# Patient Record
Sex: Female | Born: 1965
Health system: Southern US, Community
[De-identification: ages and names within clinical notes are randomized; demographics above are authoritative.]

## PROBLEM LIST (undated history)

## (undated) DIAGNOSIS — R600 Localized edema: Secondary | ICD-10-CM

## (undated) DIAGNOSIS — D131 Benign neoplasm of stomach: Secondary | ICD-10-CM

## (undated) DIAGNOSIS — R002 Palpitations: Secondary | ICD-10-CM

## (undated) DIAGNOSIS — E785 Hyperlipidemia, unspecified: Secondary | ICD-10-CM

## (undated) DIAGNOSIS — R001 Bradycardia, unspecified: Secondary | ICD-10-CM

## (undated) DIAGNOSIS — G4730 Sleep apnea, unspecified: Secondary | ICD-10-CM

## (undated) DIAGNOSIS — K639 Disease of intestine, unspecified: Secondary | ICD-10-CM

## (undated) DIAGNOSIS — N201 Calculus of ureter: Secondary | ICD-10-CM

## (undated) DIAGNOSIS — Z9889 Other specified postprocedural states: Secondary | ICD-10-CM

## (undated) DIAGNOSIS — N200 Calculus of kidney: Secondary | ICD-10-CM

## (undated) DIAGNOSIS — I10 Essential (primary) hypertension: Secondary | ICD-10-CM

## (undated) DIAGNOSIS — G4733 Obstructive sleep apnea (adult) (pediatric): Secondary | ICD-10-CM

## (undated) DIAGNOSIS — G5600 Carpal tunnel syndrome, unspecified upper limb: Secondary | ICD-10-CM

## (undated) DIAGNOSIS — R011 Cardiac murmur, unspecified: Secondary | ICD-10-CM

## (undated) DIAGNOSIS — K219 Gastro-esophageal reflux disease without esophagitis: Secondary | ICD-10-CM

## (undated) DIAGNOSIS — Z87442 Personal history of urinary calculi: Secondary | ICD-10-CM

## (undated) DIAGNOSIS — R112 Nausea with vomiting, unspecified: Secondary | ICD-10-CM

## (undated) DIAGNOSIS — I341 Nonrheumatic mitral (valve) prolapse: Secondary | ICD-10-CM

## (undated) DIAGNOSIS — Z973 Presence of spectacles and contact lenses: Secondary | ICD-10-CM

## (undated) HISTORY — DX: Nonrheumatic mitral (valve) prolapse: I34.1

## (undated) HISTORY — DX: Disease of intestine, unspecified: K63.9

## (undated) HISTORY — DX: Localized edema: R60.0

## (undated) HISTORY — DX: Palpitations: R00.2

## (undated) HISTORY — DX: Sleep apnea, unspecified: G47.30

## (undated) HISTORY — DX: Cardiac murmur, unspecified: R01.1

## (undated) HISTORY — DX: Essential (primary) hypertension: I10

## (undated) HISTORY — PX: EXTRACORPOREAL SHOCK WAVE LITHOTRIPSY: SHX1557

## (undated) HISTORY — DX: Gastro-esophageal reflux disease without esophagitis: K21.9

## (undated) HISTORY — PX: ANKLE SURGERY: SHX546

## (undated) HISTORY — PX: CARDIOVASCULAR STRESS TEST: SHX262

## (undated) HISTORY — DX: Benign neoplasm of stomach: D13.1

---

## 1992-01-02 HISTORY — PX: TUBAL LIGATION: SHX77

## 1997-01-01 HISTORY — PX: VAGINAL HYSTERECTOMY: SUR661

## 1997-06-28 ENCOUNTER — Emergency Department (HOSPITAL_COMMUNITY): Admission: EM | Admit: 1997-06-28 | Discharge: 1997-06-28 | Payer: Self-pay | Admitting: Emergency Medicine

## 1997-10-27 ENCOUNTER — Inpatient Hospital Stay (HOSPITAL_COMMUNITY): Admission: AD | Admit: 1997-10-27 | Discharge: 1997-10-29 | Payer: Self-pay | Admitting: Obstetrics & Gynecology

## 2000-03-03 ENCOUNTER — Emergency Department (HOSPITAL_COMMUNITY): Admission: EM | Admit: 2000-03-03 | Discharge: 2000-03-03 | Payer: Self-pay | Admitting: Emergency Medicine

## 2000-03-03 ENCOUNTER — Encounter: Payer: Self-pay | Admitting: Emergency Medicine

## 2001-10-29 ENCOUNTER — Encounter: Payer: Self-pay | Admitting: Family Medicine

## 2001-11-14 ENCOUNTER — Ambulatory Visit (HOSPITAL_COMMUNITY): Admission: RE | Admit: 2001-11-14 | Discharge: 2001-11-14 | Payer: Self-pay | Admitting: Orthopedic Surgery

## 2001-11-14 ENCOUNTER — Encounter (INDEPENDENT_AMBULATORY_CARE_PROVIDER_SITE_OTHER): Payer: Self-pay | Admitting: Specialist

## 2001-11-14 HISTORY — PX: WRIST GANGLION EXCISION: SUR520

## 2002-03-30 ENCOUNTER — Ambulatory Visit (HOSPITAL_COMMUNITY): Admission: RE | Admit: 2002-03-30 | Discharge: 2002-03-30 | Payer: Self-pay | Admitting: Orthopedic Surgery

## 2002-03-30 HISTORY — PX: OTHER SURGICAL HISTORY: SHX169

## 2002-10-11 ENCOUNTER — Ambulatory Visit (HOSPITAL_BASED_OUTPATIENT_CLINIC_OR_DEPARTMENT_OTHER): Admission: RE | Admit: 2002-10-11 | Discharge: 2002-10-11 | Payer: Self-pay | Admitting: Otolaryngology

## 2004-05-10 ENCOUNTER — Encounter: Admission: RE | Admit: 2004-05-10 | Discharge: 2004-05-10 | Payer: Self-pay | Admitting: Cardiovascular Disease

## 2004-05-11 ENCOUNTER — Encounter: Payer: Self-pay | Admitting: Family Medicine

## 2006-12-03 ENCOUNTER — Ambulatory Visit (HOSPITAL_COMMUNITY): Admission: RE | Admit: 2006-12-03 | Discharge: 2006-12-03 | Payer: Self-pay | Admitting: Cardiovascular Disease

## 2007-11-16 ENCOUNTER — Emergency Department (HOSPITAL_COMMUNITY): Admission: EM | Admit: 2007-11-16 | Discharge: 2007-11-16 | Payer: Self-pay | Admitting: Emergency Medicine

## 2009-01-19 ENCOUNTER — Ambulatory Visit: Payer: Self-pay | Admitting: Family Medicine

## 2009-01-19 DIAGNOSIS — I059 Rheumatic mitral valve disease, unspecified: Secondary | ICD-10-CM | POA: Insufficient documentation

## 2009-11-08 ENCOUNTER — Encounter: Payer: Self-pay | Admitting: Family Medicine

## 2010-01-31 NOTE — Assessment & Plan Note (Signed)
Summary: NEW PT EST (TXFR FROM [facility]) // RS   Vital Signs:  Patient profile:   45 year old female Menstrual status:  hysterectomy Height:      61 inches Weight:      165 pounds BMI:     31.29 Temp:     98.0 degrees F oral Pulse rate:   120 / minute Pulse rhythm:   regular Resp:     12 per minute BP sitting:   110 / 74  (left arm) Cuff size:   regular  Vitals Entered By: Sid Falcon LPN (January 19, 2009 2:18 PM) CC: New to establish     Menstrual Status hysterectomy Last PAP Result normal   History of Present Illness: New patient to establish care.  Patient has acute issue of onset of illness 2-3 days ago. Started with headache, sore throat and nasal congestion. Now has some dry cough and wheezing especially at night. Possible low-grade fevers off-and-on. Nonsmoker. No history of asthma. Out of work past couple days.  Past medical history reviewed. She has history of reported mitral valve prolapse and takes Toprol and Cardizem. Previous hysterectomy 1999. Allergy to penicillin and vancomycin.  She is not aware of any hx of hypertension.  Family history reviewed significant for mother with heart disease hypertension mother and father mother with type 2 diabetes history.  Patient is married. Nonsmoker. No alcohol use.  Preventive Screening-Counseling & Management  Alcohol-Tobacco     Smoking Status: never  Allergies (verified): 1)  Penicillin V Potassium (Penicillin V Potassium) 2)  Vancocin Hcl (Vancomycin Hcl)  Past History:  Family History: Last updated: 01/19/2009 Heart disease, mother, grandmother Family History Hypertension, both parents Diabetes, mother, type ll  Social History: Last updated: 01/19/2009 Occupation:  Engineer, mining Married Never Smoked Alcohol use-yes  Risk Factors: Smoking Status: never (01/19/2009)  Past Medical History: Chicken pox Heart murmur/MVP  Past Surgical History: Hysterectomy/partial 1999  Family  History: Heart disease, mother, grandmother Family History Hypertension, both parents Diabetes, mother, type ll  Social History: Occupation:  Engineer, 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   Ears:  patient some scarring left tympanic membrane otherwise normal Nose:  External nasal examination shows no deformity or inflammation. Nasal mucosa are pink and moist without lesions or exudates. Mouth:  no erythema or exudate Neck:  supple with slightly tender anterior cervical nodes bilaterally Lungs:  diffuse wheezes. No rales. No retractions. Heart:  normal rate and regular rhythm.   Skin:  no rash Cervical Nodes:  No lymphadenopathy noted   Impression & Recommendations:  Problem # 1:  BRONCHITIS, ACUTE WITH MILD BRONCHOSPASM (ICD-466.0) Assessment New Prednisone taper, ProAir inh as needed , and start Z-max. Her updated medication list for this problem includes:    Proair Hfa 108 (90 Base) Mcg/act Aers (Albuterol sulfate) .Marland Kitchen... 2 puffs q 4 hours as needed cough and wheeze    Azithromycin 250 Mg Tabs (Azithromycin) .Marland Kitchen... 2 by mouth today then one by mouth once daily for 4 days  Problem # 2:  MITRAL VALVE PROLAPSE (ICD-424.0)  Her updated medication list for this problem includes:    Toprol Xl 50 Mg Xr24h-tab (Metoprolol succinate) .Marland Kitchen..Marland Kitchen Two times a day  Complete Medication List: 1)  Toprol Xl 50 Mg Xr24h-tab (Metoprolol succinate) .... Two times a day 2)  Cardizem La 240 Mg Xr24h-tab (Diltiazem hcl coated beads) .... Once daily 3)  Prednisone 10 Mg Tabs (Prednisone) .... Taper as follows:  4-4-4-3-3-2-1 4)  Proair Hfa 108 (90 Base) Mcg/act Aers (Albuterol sulfate) .... 2  puffs q 4 hours as needed cough and wheeze 5)  Azithromycin 250 Mg Tabs (Azithromycin) .... 2 by mouth today then one by mouth once daily for 4 days  Patient Instructions: 1)  Acute Bronchitis symptoms for less then 10 days are not  helped by antibiotics. Take over the counter cough medications. Call if no improvement in 5-7 days, sooner if increasing cough, fever, or new symptoms ( shortness of breath, chest pain) .  Prescriptions: AZITHROMYCIN 250 MG TABS (AZITHROMYCIN) 2 by mouth today then one by mouth once daily for 4 days  #6 x 0   Entered and Authorized by:   Evelena Peat MD   Signed by:   Evelena Peat MD on 01/19/2009   Method used:   Print then Give to Patient   RxID:   [id number] PROAIR HFA 108 (90 BASE) MCG/ACT AERS (ALBUTEROL SULFATE) 2 puffs q 4 hours as needed cough and wheeze  #1 x 1   Entered and Authorized by:   Evelena Peat MD   Signed by:   Evelena Peat MD on 01/19/2009   Method used:   Print then Give to Patient   RxID:   [id number] PREDNISONE 10 MG TABS (PREDNISONE) taper as follows:  4-4-4-3-3-2-1  #21 x 0   Entered and Authorized by:   Evelena Peat MD   Signed by:   Evelena Peat MD on 01/19/2009   Method used:   Print then Give to Patient   RxID:   [id number]   Preventive Care Screening  Pap Smear:    Date:  01/01/2005    Results:  normal   Mammogram:    Date:  01/01/1998    Results:  normal

## 2010-05-19 NOTE — Op Note (Signed)
Sarah Wong, Sarah Wong                          ACCOUNT NO.:  [id number]   MEDICAL RECORD NO.:  [id number]                    PATIENT TYPE:   LOCATION:                                       FACILITY:   PHYSICIAN:  Marlowe Kays, M.D.               DATE OF BIRTH:   DATE OF PROCEDURE:  03/30/2002  DATE OF DISCHARGE:                                 OPERATIVE REPORT   PREOPERATIVE DIAGNOSES:  Chronic pain and swelling dorsum right wrist post  excision of ganglion.   POSTOPERATIVE DIAGNOSES:  Fibrosis with secondary loss of motion and chronic  tendonitis of extensor carpi radialis brevis, right wrist.   OPERATION:  Exploration of right wrist with capsulotomy and inspection of  joint and tenolysis of extensor carpi radialis brevis.   SURGEON:  Marlowe Kays, M.D.   ASSISTANT:  Nurse.   ANESTHESIA:  General.   PATHOLOGY AND JUSTIFICATION FOR PROCEDURE:  The original surgery was on  November 14, 2001. Despite physical therapy, she has had marked restriction  of motion and has had some chronic problems with swelling, pain and  tenderness over the radial and proximal to the most radial portion of the  incision. For these reasons, I felt the exploration of the wrist was the  treatment of choice to better determine exactly what was going on and  correct it.   DESCRIPTION OF PROCEDURE:  Satisfactory general anesthesia, once  anesthetized I checked motion of her right wrist which went from almost no  motion to 75 degrees of dorsiflexion and 45 degrees of palmar flexion. I  then prepped the hand, wrist and forearm and draped in a sterile field after  inflating the pneumatic tourniquet.  I went through the old surgical  incision more bias to the radial side since this is where her pain and  swelling was. With careful dissection, I opened the tendon sheath for the  common extensor tendon which was normal but the tendon sheath for the  extensor brevis had a moderate amount of discus  ganglion type fluid in it.  It should be consistent with a chronic tendonitis. There were no real  adhesions about the tendon, however. Retracting the brevis tendon radialward  and the common extensor tendon lateralward, I opened this joint and found no  evidence for any recurrent ganglion. There was a small aperture going deep  radialward which may or may not have been iatrogenic as I was clearing out  tissue in the joint. After inspection of the joint, I irrigated the joint  well with sterile saline and closed the capsule with interrupted 3-0 Vicryl  with the wrist in slight dorsiflexion. I then infiltrated all soft tissues  with 0.5% plain Marcaine. The extensor brevis sheath was further inspected  and opened so that there was no compression on the tendon and plenty of room  for drainage. I then loosely closed serous type tissue over the  brevis and  common extensor tendons. The subcutaneous tissue was closed with interrupted  4-0 Vicryl and skin with Steri-Strips. A dry sterile dressing and volar  plaster splint were applied. The tourniquet was released, she tolerated the  procedure well and was taken to the recovery room in satisfactory condition  with no known complications.                                              Marlowe Kays, M.D.   JA/MEDQ  D:  03/30/2002  T:  03/30/2002  Job:  [id number]

## 2010-05-19 NOTE — Op Note (Signed)
   Sarah Wong, Sarah Wong                          ACCOUNT NO.:  [id number]   MEDICAL RECORD NO.:  [id number]                   PATIENT TYPE:  AMB   LOCATION:  DAY                                  FACILITY:  [facility]   PHYSICIAN:  Marlowe Kays, M.D.               DATE OF BIRTH:  Aug 22, 1965   DATE OF PROCEDURE:  11/14/2001  DATE OF DISCHARGE:                                 OPERATIVE REPORT   PREOPERATIVE DIAGNOSIS:  Painful ganglion cyst, dorsum right wrist.   POSTOPERATIVE DIAGNOSIS:  Painful ganglion cyst, dorsum right wrist.   OPERATION:  Excision of ganglion cyst, dorsum right wrist.   SURGEON:  Illene Labrador. [place], M.D.   ASSISTANT:  Nurse.   ANESTHESIA:  IV regional.   PATHOLOGY AND JUSTIFICATION FOR PROCEDURE:  This has been present for  several months with enough pain that she elected to have it excised.  We  defined a ganglion which was somewhat scarred in.   PROCEDURE:  Satisfactory IV regional anesthesia, although she did have a  good bit of emotional lability during the case, and is probably not a  suitable candidate for IV regional in the future.  The right hand and  forearm were prepped with DuraPrep and draped in a sterile field.  Transverse incision was made over the site of the ganglion.  One large  dorsal vein was retracted and protected.  Extensor tendons were retracted to  either side after incising the dorsal retinaculum, and several areas  comprising almost of multiloculated cysts were identified and excised.  There was some minimal bleeding coagulated with bipolar cautery.  After  excision of the cyst, which was sent to pathology, the wound was irrigated  with sterile saline and the capsule of the wrist joint reapproximated with  interrupted 3-0 Vicryl as was the extensor retinaculum, subcutaneous tissue  with interrupted 4-0 Vicryl and the skin with running 4-0 nylon Taylor  stitch.  Betadine, Adaptic, dry sterile dressing, and volar plaster splint  were  applied.  Tourniquet was released.  She tolerated the procedure well  and was taken to the recovery room in satisfactory condition with no known  complications.                                               Marlowe Kays, M.D.    JA/MEDQ  D:  11/14/2001  T:  11/14/2001  Job:  [id number]

## 2010-09-25 ENCOUNTER — Ambulatory Visit (INDEPENDENT_AMBULATORY_CARE_PROVIDER_SITE_OTHER): Payer: Self-pay | Admitting: Family Medicine

## 2010-09-25 ENCOUNTER — Encounter: Payer: Self-pay | Admitting: Family Medicine

## 2010-09-25 VITALS — BP 114/80 | HR 63 | Temp 98.3°F | Wt 168.0 lb

## 2010-09-25 DIAGNOSIS — R350 Frequency of micturition: Secondary | ICD-10-CM

## 2010-09-25 DIAGNOSIS — N390 Urinary tract infection, site not specified: Secondary | ICD-10-CM

## 2010-09-25 LAB — POCT URINALYSIS DIPSTICK
Spec Grav, UA: 1.02
Urobilinogen, UA: 0.2
pH, UA: 6

## 2010-09-25 MED ORDER — CIPROFLOXACIN HCL 500 MG PO TABS: 500.0000 mg | ORAL_TABLET | Freq: Two times a day (BID) | ORAL | Status: AC

## 2010-09-25 NOTE — Progress Notes (Signed)
  Subjective:    Patient ID: Sarah Wong, female    DOB: 07/06/1965, 45 y.o.   MRN: [id number]  HPI Here for 3 days of a dull aching pain in the right flank and of urgency and frequency to urinate. No burning. No fever. Her appetite is normal. She has had nausea but no vomiting. BMs are normal.    Review of Systems  Constitutional: Negative.   Respiratory: Negative.   Cardiovascular: Negative.   Gastrointestinal: Positive for nausea and abdominal pain. Negative for vomiting, diarrhea, constipation, blood in stool and abdominal distention.  Genitourinary: Positive for urgency, frequency and flank pain. Negative for dysuria and hematuria.       Objective:   Physical Exam  Constitutional: She appears well-developed and well-nourished.  Pulmonary/Chest: Effort normal and breath sounds normal.  Abdominal: Soft. Bowel sounds are normal. She exhibits no distension and no mass. There is no rebound and no guarding.       Mildly tender in the RLQ          Assessment & Plan:  This is likely a UTI so we will start her on Cipro. Drink plenty of water. Culture the urine. Less likely etiologies include a ureteral stone or appendicitis. She understands that she needs to go to an ER immediately if she gets worsening of the pain or fever or vomiting

## 2010-09-25 NOTE — Progress Notes (Signed)
Addended by: Gershon Crane A on: 09/25/2010 05:47 PM   Modules accepted: Orders

## 2010-09-29 LAB — URINE CULTURE: Colony Count: 35000

## 2010-10-03 ENCOUNTER — Telehealth: Payer: Self-pay | Admitting: Family Medicine

## 2010-10-03 NOTE — Telephone Encounter (Signed)
Message copied by Baldemar Friday on Tue Oct 03, 2010 12:50 PM ------      Message from: Gershon Crane A      Created: Fri Sep 29, 2010  1:56 PM       Her urine grew a common bacteria that should respond to the Cipro she is taking

## 2010-10-03 NOTE — Telephone Encounter (Signed)
Left voice message with results.

## 2010-10-04 LAB — URINE MICROSCOPIC-ADD ON

## 2010-10-04 LAB — CBC
HCT: 41.3
MCV: 95.3
Platelets: 235
RDW: 13.1

## 2010-10-04 LAB — URINALYSIS, ROUTINE W REFLEX MICROSCOPIC
Glucose, UA: NEGATIVE
Specific Gravity, Urine: 1.02
Urobilinogen, UA: 0.2
pH: 6

## 2010-10-04 LAB — DIFFERENTIAL
Lymphs Abs: 1.4
Monocytes Absolute: 0.4
Monocytes Relative: 3
Neutrophils Relative %: 88 — ABNORMAL HIGH

## 2010-10-04 LAB — BASIC METABOLIC PANEL
BUN: 8
GFR calc non Af Amer: >60
Glucose, Bld: 105 — ABNORMAL HIGH
Potassium: 4.6

## 2011-05-19 ENCOUNTER — Emergency Department (HOSPITAL_COMMUNITY): Payer: BC Managed Care – PPO

## 2011-05-19 ENCOUNTER — Emergency Department (HOSPITAL_COMMUNITY)
Admission: EM | Admit: 2011-05-19 | Discharge: 2011-05-19 | Disposition: A | Discharge status: Home / Self Care | Payer: BC Managed Care – PPO | Attending: Emergency Medicine | Admitting: Emergency Medicine

## 2011-05-19 ENCOUNTER — Encounter (HOSPITAL_COMMUNITY): Payer: Self-pay | Admitting: Emergency Medicine

## 2011-05-19 DIAGNOSIS — N23 Unspecified renal colic: Secondary | ICD-10-CM

## 2011-05-19 DIAGNOSIS — R109 Unspecified abdominal pain: Secondary | ICD-10-CM | POA: Insufficient documentation

## 2011-05-19 DIAGNOSIS — R10819 Abdominal tenderness, unspecified site: Secondary | ICD-10-CM | POA: Insufficient documentation

## 2011-05-19 DIAGNOSIS — N201 Calculus of ureter: Secondary | ICD-10-CM | POA: Insufficient documentation

## 2011-05-19 DIAGNOSIS — R112 Nausea with vomiting, unspecified: Secondary | ICD-10-CM | POA: Insufficient documentation

## 2011-05-19 LAB — URINALYSIS, ROUTINE W REFLEX MICROSCOPIC
Bilirubin Urine: NEGATIVE
Ketones, ur: NEGATIVE mg/dL
Nitrite: NEGATIVE
pH: 5.5 (ref 5.0–8.0)

## 2011-05-19 LAB — CBC
HCT: 41 % (ref 36.0–46.0)
Hemoglobin: 14.1 g/dL (ref 12.0–15.0)
MCH: 32.6 pg (ref 26.0–34.0)
MCHC: 34.4 g/dL (ref 30.0–36.0)

## 2011-05-19 LAB — BASIC METABOLIC PANEL
BUN: 7 mg/dL (ref 6–23)
Chloride: 102 mEq/L (ref 96–112)
Creatinine, Ser: 0.78 mg/dL (ref 0.50–1.10)
GFR calc Af Amer: >90 mL/min (ref >90)
GFR calc non Af Amer: >90 mL/min (ref >90)
Glucose, Bld: 139 mg/dL — ABNORMAL HIGH (ref 70–99)

## 2011-05-19 LAB — DIFFERENTIAL
Basophils Relative: 0 % (ref 0–1)
Eosinophils Absolute: 0 10*3/uL (ref 0.0–0.7)
Monocytes Absolute: 0.3 10*3/uL (ref 0.1–1.0)
Monocytes Relative: 3 % (ref 3–12)

## 2011-05-19 MED ORDER — ONDANSETRON 4 MG PO TBDP
4.0000 mg | ORAL_TABLET | Freq: Once | ORAL | Status: AC
  Administered 2011-05-19: 4 mg via ORAL
  Filled 2011-05-19: qty 1

## 2011-05-19 MED ORDER — TAMSULOSIN HCL 0.4 MG PO CAPS: 0.4000 mg | ORAL_CAPSULE | Freq: Every day | ORAL | Status: DC

## 2011-05-19 MED ORDER — OXYCODONE-ACETAMINOPHEN 5-325 MG PO TABS: 1.0000 | ORAL_TABLET | ORAL | Status: DC | PRN

## 2011-05-19 MED ORDER — ONDANSETRON HCL 4 MG/2ML IJ SOLN
4.0000 mg | Freq: Once | INTRAMUSCULAR | Status: AC
  Administered 2011-05-19: 4 mg via INTRAVENOUS
  Filled 2011-05-19: qty 2

## 2011-05-19 MED ORDER — HYDROMORPHONE HCL PF 1 MG/ML IJ SOLN
1.0000 mg | Freq: Once | INTRAMUSCULAR | Status: AC
  Administered 2011-05-19: 1 mg via INTRAVENOUS
  Filled 2011-05-19: qty 1

## 2011-05-19 MED ORDER — OXYCODONE-ACETAMINOPHEN 5-325 MG PO TABS
1.0000 | ORAL_TABLET | Freq: Once | ORAL | Status: AC
  Administered 2011-05-19: 1 via ORAL
  Filled 2011-05-19: qty 1

## 2011-05-19 MED ORDER — ONDANSETRON HCL 4 MG PO TABS: 4.0000 mg | ORAL_TABLET | Freq: Four times a day (QID) | ORAL | Status: AC

## 2011-05-19 NOTE — ED Provider Notes (Signed)
Medical screening examination/treatment/procedure(s) were performed by non-physician practitioner and as supervising physician I was immediately available for consultation/collaboration.   Niki Cosman, MD 05/19/11 1657 

## 2011-05-19 NOTE — ED Notes (Signed)
Pt reports woke up at 0930 today with left sided abdominal pain with n/v.

## 2011-05-19 NOTE — Discharge Instructions (Signed)

## 2011-05-19 NOTE — ED Provider Notes (Signed)
History     CSN: [id number]  Arrival date & time 05/19/11  1205   First MD Initiated Contact with Patient 05/19/11 1226      Chief Complaint  Patient presents with  . Nausea  . Emesis  . Abdominal Pain    (Consider location/radiation/quality/duration/timing/severity/associated sxs/prior treatment) HPI Comments: Patient reports left sided abdominal pain with associated nausea and vomiting that began this morning.  Reports pain is sharp and constant, throughout left abdomen.  Emesis is contents of her stomach.  Pain is gradually increasing.  Denies fevers, urinary or vaginal symptoms, change in bowel habits.  No hematemesis.  Has hx partial hysterectomy.  Has hx kidney stones, but states this is worse than her kidney stones.    Patient is a 46 y.o. female presenting with vomiting and abdominal pain. The history is provided by the patient.  Emesis  Associated symptoms include abdominal pain. Pertinent negatives include no diarrhea and no fever.  Abdominal Pain The primary symptoms of the illness include abdominal pain, nausea and vomiting. The primary symptoms of the illness do not include fever, diarrhea, dysuria, vaginal discharge or vaginal bleeding.  Symptoms associated with the illness do not include constipation, urgency, frequency or back pain.    Past Medical History  Diagnosis Date  . Heart murmur     Past Surgical History  Procedure Date  . Abdominal hysterectomy   . Nasal septum surgery     Family History  Problem Relation Age of Onset  . Heart disease      grandmother  . Hypertension Mother   . Hypertension Father   . Diabetes Mother     History  Substance Use Topics  . Smoking status: Never Smoker   . Smokeless tobacco: Never Used  . Alcohol Use: No    OB History    Grav Para Term Preterm Abortions TAB SAB Ect Mult Living                  Review of Systems  Constitutional: Negative for fever.  Gastrointestinal: Positive for nausea, vomiting and  abdominal pain. Negative for diarrhea, constipation and blood in stool.  Genitourinary: Negative for dysuria, urgency, frequency, vaginal bleeding, vaginal discharge and menstrual problem.  Musculoskeletal: Negative for back pain.  All other systems reviewed and are negative.    Allergies  Iohexol; Penicillins; and Vancomycin  Home Medications   Current Outpatient Rx  Name Route Sig Dispense Refill  . DILTIAZEM HCL ER 180 MG PO CP24 Oral Take 180 mg by mouth daily.    Marland Kitchen METOPROLOL SUCCINATE ER 25 MG PO TB24 Oral Take 25 mg by mouth 2 (two) times daily.    Marland Kitchen PANTOPRAZOLE SODIUM 40 MG PO TBEC Oral Take 40 mg by mouth daily.      Marland Kitchen ROSUVASTATIN CALCIUM 5 MG PO TABS Oral Take 5 mg by mouth daily.    . ALBUTEROL SULFATE HFA 108 (90 BASE) MCG/ACT IN AERS Inhalation Inhale 2 puffs into the lungs every 4 (four) hours as needed. For shortness of breath      BP 147/93  Pulse 74  Temp(Src) 97.8 F (36.6 C) (Oral)  Resp 20  SpO2 99%  Physical Exam  Constitutional: She is oriented to person, place, and time. She appears well-developed and well-nourished.  HENT:  Head: Normocephalic and atraumatic.  Neck: Neck supple.  Cardiovascular: Normal rate, regular rhythm and normal heart sounds.   Pulmonary/Chest: Breath sounds normal. No respiratory distress. She has no wheezes. She has no rales.  She exhibits no tenderness.  Abdominal: Soft. Bowel sounds are normal. There is tenderness in the left upper quadrant and left lower quadrant. There is no rigidity, no rebound, no guarding and no CVA tenderness.  Neurological: She is alert and oriented to person, place, and time.    ED Course  Procedures (including critical care time)  Labs Reviewed  CBC - Abnormal; Notable for the following:    WBC 12.1 (*)    All other components within normal limits  DIFFERENTIAL - Abnormal; Notable for the following:    Neutrophils Relative 84 (*)    Neutro Abs 10.1 (*)    All other components within normal  limits  BASIC METABOLIC PANEL - Abnormal; Notable for the following:    Glucose, Bld 139 (*)    All other components within normal limits  URINALYSIS, ROUTINE W REFLEX MICROSCOPIC - Abnormal; Notable for the following:    APPearance CLOUDY (*)    Hgb urine dipstick LARGE (*)    All other components within normal limits  URINE MICROSCOPIC-ADD ON - Abnormal; Notable for the following:    Squamous Epithelial / LPF MANY (*)    All other components within normal limits   No results found.  3:27 PM Pain and nausea continue to be well controlled.  Pending CT scan.    No diagnosis found.    MDM  Afebrile patient with left sided abdominal pain with associated N/V, one loose stool this morning.  WBC count 12.1, +hematuria without evidence of infection, glucose 139.  Patient's pain well controlled in ED.  Pending CT scan to r/o ureteral stone, diverticulitis, colitis.  Discussed patient with Dr Ranae Palms who assumes care of patient at change of shift.          [place], [place] 05/19/11 [phone]

## 2011-05-21 ENCOUNTER — Ambulatory Visit (HOSPITAL_COMMUNITY): Payer: BC Managed Care – PPO | Admitting: Anesthesiology

## 2011-05-21 ENCOUNTER — Ambulatory Visit (HOSPITAL_COMMUNITY)
Admission: RE | Admit: 2011-05-21 | Discharge: 2011-05-21 | Disposition: A | Discharge status: Home / Self Care | Payer: BC Managed Care – PPO | Source: Ambulatory Visit | Attending: Urology | Admitting: Urology

## 2011-05-21 ENCOUNTER — Encounter (HOSPITAL_COMMUNITY): Payer: Self-pay | Admitting: Anesthesiology

## 2011-05-21 ENCOUNTER — Other Ambulatory Visit: Payer: Self-pay | Admitting: Urology

## 2011-05-21 ENCOUNTER — Encounter (HOSPITAL_COMMUNITY)
Admission: RE | Disposition: A | Discharge status: Home / Self Care | Payer: Self-pay | Source: Ambulatory Visit | Attending: Urology

## 2011-05-21 ENCOUNTER — Encounter (HOSPITAL_COMMUNITY): Payer: Self-pay | Admitting: *Deleted

## 2011-05-21 DIAGNOSIS — I059 Rheumatic mitral valve disease, unspecified: Secondary | ICD-10-CM | POA: Insufficient documentation

## 2011-05-21 DIAGNOSIS — G4730 Sleep apnea, unspecified: Secondary | ICD-10-CM | POA: Insufficient documentation

## 2011-05-21 DIAGNOSIS — K219 Gastro-esophageal reflux disease without esophagitis: Secondary | ICD-10-CM | POA: Insufficient documentation

## 2011-05-21 DIAGNOSIS — N201 Calculus of ureter: Secondary | ICD-10-CM | POA: Insufficient documentation

## 2011-05-21 DIAGNOSIS — Z79899 Other long term (current) drug therapy: Secondary | ICD-10-CM | POA: Insufficient documentation

## 2011-05-21 HISTORY — PX: CYSTOSCOPY W/ URETERAL STENT PLACEMENT: SHX1429

## 2011-05-21 LAB — BASIC METABOLIC PANEL
BUN: 10 mg/dL (ref 6–23)
CO2: 27 mEq/L (ref 19–32)
Chloride: 99 mEq/L (ref 96–112)
Creatinine, Ser: 1.25 mg/dL — ABNORMAL HIGH (ref 0.50–1.10)
GFR calc Af Amer: 58 mL/min — ABNORMAL LOW (ref >90)
Glucose, Bld: 106 mg/dL — ABNORMAL HIGH (ref 70–99)
Potassium: 3.7 mEq/L (ref 3.5–5.1)

## 2011-05-21 LAB — SURGICAL PCR SCREEN
MRSA, PCR: NEGATIVE
Staphylococcus aureus: POSITIVE — AB

## 2011-05-21 LAB — CBC
HCT: 40.4 % (ref 36.0–46.0)
Hemoglobin: 13.8 g/dL (ref 12.0–15.0)
MCV: 93.5 fL (ref 78.0–100.0)
RDW: 12.4 % (ref 11.5–15.5)
WBC: 13.4 10*3/uL — ABNORMAL HIGH (ref 4.0–10.5)

## 2011-05-21 SURGERY — CYSTOSCOPY, WITH RETROGRADE PYELOGRAM AND URETERAL STENT INSERTION
Anesthesia: General | Site: Ureter | Laterality: Left | Wound class: Clean Contaminated

## 2011-05-21 MED ORDER — SODIUM CHLORIDE 0.9 % IR SOLN
Status: DC | PRN
  Administered 2011-05-21: 3000 mL

## 2011-05-21 MED ORDER — ONDANSETRON HCL 4 MG/2ML IJ SOLN
INTRAMUSCULAR | Status: DC | PRN
  Administered 2011-05-21: 4 mg via INTRAVENOUS

## 2011-05-21 MED ORDER — MORPHINE SULFATE 2 MG/ML IJ SOLN
INTRAMUSCULAR | Status: AC
  Administered 2011-05-21: 2 mg via INTRAVENOUS
  Filled 2011-05-21: qty 1

## 2011-05-21 MED ORDER — SODIUM CHLORIDE 0.9 % IV SOLN: 250.0000 mL | INTRAVENOUS | Status: DC | PRN

## 2011-05-21 MED ORDER — SODIUM CHLORIDE 0.9 % IJ SOLN: 3.0000 mL | INTRAMUSCULAR | Status: DC | PRN

## 2011-05-21 MED ORDER — LIDOCAINE HCL (CARDIAC) 20 MG/ML IV SOLN
INTRAVENOUS | Status: DC | PRN
  Administered 2011-05-21: 80 mg via INTRAVENOUS

## 2011-05-21 MED ORDER — FENTANYL CITRATE 0.05 MG/ML IJ SOLN: 25.0000 ug | INTRAMUSCULAR | Status: DC | PRN

## 2011-05-21 MED ORDER — ACETAMINOPHEN 650 MG RE SUPP
650.0000 mg | RECTAL | Status: DC | PRN
  Filled 2011-05-21: qty 1

## 2011-05-21 MED ORDER — IOHEXOL 300 MG/ML  SOLN
INTRAMUSCULAR | Status: DC | PRN
  Administered 2011-05-21: 3 mL via INTRAVENOUS

## 2011-05-21 MED ORDER — MORPHINE SULFATE 2 MG/ML IJ SOLN
2.0000 mg | INTRAMUSCULAR | Status: DC | PRN
  Administered 2011-05-21: 2 mg via INTRAVENOUS

## 2011-05-21 MED ORDER — OXYCODONE HCL 5 MG PO TABS: 5.0000 mg | ORAL_TABLET | ORAL | Status: DC | PRN

## 2011-05-21 MED ORDER — MUPIROCIN 2 % EX OINT
TOPICAL_OINTMENT | CUTANEOUS | Status: AC
  Filled 2011-05-21: qty 22

## 2011-05-21 MED ORDER — LACTATED RINGERS IV SOLN
INTRAVENOUS | Status: DC | PRN
  Administered 2011-05-21: 19:00:00 via INTRAVENOUS

## 2011-05-21 MED ORDER — LACTATED RINGERS IV SOLN: INTRAVENOUS | Status: DC

## 2011-05-21 MED ORDER — PROPOFOL 10 MG/ML IV EMUL
INTRAVENOUS | Status: DC | PRN
  Administered 2011-05-21: 200 mg via INTRAVENOUS

## 2011-05-21 MED ORDER — HYOSCYAMINE SULFATE 0.125 MG SL SUBL: 0.1250 mg | SUBLINGUAL_TABLET | SUBLINGUAL | Status: DC | PRN

## 2011-05-21 MED ORDER — PROMETHAZINE HCL 25 MG/ML IJ SOLN: 6.2500 mg | INTRAMUSCULAR | Status: DC | PRN

## 2011-05-21 MED ORDER — FENTANYL CITRATE 0.05 MG/ML IJ SOLN
INTRAMUSCULAR | Status: DC | PRN
  Administered 2011-05-21: 50 ug via INTRAVENOUS
  Administered 2011-05-21: 25 ug via INTRAVENOUS

## 2011-05-21 MED ORDER — ONDANSETRON HCL 4 MG/2ML IJ SOLN: 4.0000 mg | Freq: Four times a day (QID) | INTRAMUSCULAR | Status: DC | PRN

## 2011-05-21 MED ORDER — PHENAZOPYRIDINE HCL 200 MG PO TABS: 200.0000 mg | ORAL_TABLET | Freq: Three times a day (TID) | ORAL | Status: AC | PRN

## 2011-05-21 MED ORDER — METOPROLOL SUCCINATE ER 25 MG PO TB24
25.0000 mg | ORAL_TABLET | Freq: Every day | ORAL | Status: DC
  Filled 2011-05-21: qty 1

## 2011-05-21 MED ORDER — IOHEXOL 300 MG/ML  SOLN
INTRAMUSCULAR | Status: AC
  Filled 2011-05-21: qty 1

## 2011-05-21 MED ORDER — SODIUM CHLORIDE 0.9 % IJ SOLN: 3.0000 mL | Freq: Two times a day (BID) | INTRAMUSCULAR | Status: DC

## 2011-05-21 MED ORDER — LACTATED RINGERS IV SOLN
INTRAVENOUS | Status: DC
  Administered 2011-05-21: 21:00:00 via INTRAVENOUS

## 2011-05-21 MED ORDER — ACETAMINOPHEN 325 MG PO TABS: 650.0000 mg | ORAL_TABLET | ORAL | Status: DC | PRN

## 2011-05-21 MED ORDER — OXYCODONE-ACETAMINOPHEN 5-325 MG PO TABS: 1.0000 | ORAL_TABLET | ORAL | Status: AC | PRN

## 2011-05-21 MED ORDER — CIPROFLOXACIN IN D5W 400 MG/200ML IV SOLN
400.0000 mg | INTRAVENOUS | Status: AC
  Administered 2011-05-21: 400 mg via INTRAVENOUS

## 2011-05-21 SURGICAL SUPPLY — 15 items
BAG URO CATCHER STRL LF (DRAPE) ×2 IMPLANT
BASKET LASER NITINOL 1.9FR (BASKET) ×2 IMPLANT
BSKT STON RTRVL 120 1.9FR (BASKET) ×2
CATH URET 5FR 28IN OPEN ENDED (CATHETERS) ×2 IMPLANT
CLOTH BEACON ORANGE TIMEOUT ST (SAFETY) ×2 IMPLANT
DRAPE CAMERA CLOSED 9X96 (DRAPES) ×2 IMPLANT
GLOVE SURG SS PI 8.0 STRL IVOR (GLOVE) ×2 IMPLANT
GOWN PREVENTION PLUS XLARGE (GOWN DISPOSABLE) ×2 IMPLANT
GOWN STRL REIN XL XLG (GOWN DISPOSABLE) ×2 IMPLANT
MANIFOLD NEPTUNE II (INSTRUMENTS) ×2 IMPLANT
MARKER SKIN DUAL TIP RULER LAB (MISCELLANEOUS) ×2 IMPLANT
PACK CYSTO (CUSTOM PROCEDURE TRAY) ×2 IMPLANT
STENT CONTOUR 6FRX26X.038 (STENTS) ×2 IMPLANT
TUBING CONNECTING 10 (TUBING) ×2 IMPLANT
WIRE COONS/BENSON .038X145CM (WIRE) ×2 IMPLANT

## 2011-05-21 NOTE — H&P (Signed)
ctive Problems Problems  1. Nephrolithiasis Of Both Kidneys 592.0 2. Ureteral Stone Left 592.1  History of Present Illness        Sarah Wong is a 46 yo WF who had the onset of severe left flank pain on Saturday AM.  She was seen in the ER and had a CT that showed a 5mm LUPJ stone.   She got pain relief with the med but the pain recurred and remains severe since 3 am.  She has nausea and vomiting as well. She is voiding without complaints. She may have had a prior stone but no other GU history. She has no other associated signs or symptoms.   Past Medical History Problems  1. History of  Adult Sleep Apnea 780.57 2. History of  Esophageal Reflux 530.81 3. History of  Murmurs 785.2 4. History of  Nephrolithiasis V13.01 5. History of  Prolapsing Mitral Valve Leaflet Syndrome 424.0  Surgical History Problems  1. History of  Hysterectomy V45.77 2. History of  Wrist Surgery Right   G2P2 NVD   Current Meds 1. Diltiazem HCl TABS; Therapy: (Recorded:20May2013) to 2. Metoprolol Tartrate TABS; Therapy: (Recorded:20May2013) to 3. Pantoprazole Sodium 40 MG Oral Tablet Delayed Release; Therapy: (Recorded:20May2013) to  Allergies Medication  1. Penicillins 2. Contrast Media Ready-Box MISC 3. Vancomycin HCl Powder  Family History Problems  1. Paternal history of  Blood Disorders V18.3 2. Family history of  Death In The Family Mother age 66 unknown 3. Maternal history of  Diabetes Mellitus V18.0 4. Family history of  Family Health Status Number Of Children 2 daughters 5. Family history of  Heart Disease V17.49 6. Maternal history of  Hypertension V17.49 7. Paternal history of  Nephritis  Social History Problems    Alcohol Use 2 glasses per month   Caffeine Use coke and pepsi x 5 and tea.   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  Genitourinary: urinary frequency and nocturia.  Gastrointestinal: nausea, vomiting, flank pain and heartburn.  Constitutional: feeling tired (fatigue).  ENT: sore throat.  Musculoskeletal: back pain.  Neurological: headache.    Vitals Vital Signs [Data Includes: Last 1 Day]  20May2013 03:12PM  BMI Calculated: 32.39 BSA Calculated: 1.72 Height: 5 ft  Weight: 165 lb  Blood Pressure: 130 / 81 Temperature: 98.6 F Heart Rate: 61  Physical Exam Constitutional: Well nourished and well developed . No acute distress.  Neck: The appearance of the neck is normal and no neck mass is present.  Pulmonary: No respiratory distress and normal respiratory rhythm and effort.  Cardiovascular: Heart rate and rhythm are normal . No peripheral edema.  Abdomen: The abdomen is soft and nontender. No masses are palpated. No right CVA tenderness and moderate left CVA tenderness. No hernias are palpable. No hepatosplenomegaly noted.  Skin: Normal skin turgor, no visible rash and no visible skin lesions.  Neuro/Psych:. Mood and affect are appropriate.    Results/Data Urine [Data Includes: Last 1 Day]   20May2013  COLOR YELLOW   APPEARANCE CLEAR   SPECIFIC GRAVITY 1.010   pH 6.0   GLUCOSE NEG mg/dL  BILIRUBIN NEG   KETONE NEG mg/dL  BLOOD MOD   PROTEIN NEG mg/dL  UROBILINOGEN 0.2 mg/dL  NITRITE NEG     LEUKOCYTE ESTERASE NEG   SQUAMOUS EPITHELIAL/HPF MODERATE   WBC 0-3 WBC/hpf  RBC 4-6 RBC/hpf  BACTERIA FEW   CRYSTALS NONE SEEN   CASTS NONE SEEN    The following images/tracing/specimen were independently visualized:  KUB today shows a 5mm stone in the area of the left proximal ureter. She has a small LLP stone visible. There is contrast in the colon that obscures the right kidney. No other  abnormalities are noted. I have reviewed her CT films and report.    Assessment Assessed  1. Ureteral Stone Left 592.1 2. Nephrolithiasis Of Both Kidneys 592.0   She has a 5mm left UPJ stone with intractable pain.   She also has small bilateral renal stones.   Plan Health Maintenance (V70.0)  1. UA With REFLEX  Done: 20May2013 02:44PM Ureteral Stone (592.1)  2. Morphine Sulfate 15 MG/ML Injection Solution; INJECT 10  MG Intramuscular; To Be Done:  20May2013; Status: HOLD FOR - Administration 3. Promethazine HCl 25 MG/ML Injection Solution; INJECT 25 MG Intramuscular; To Be Done:  20May2013; Status: HOLD FOR - Administration 4. KUB  Done: 20May2013 12:00AM 5. Follow-up Schedule Surgery Office  Follow-up  Requested for: 20May2013   She was given Morphine 10mg and Phenergan 25mg IM for pain relief. I discussed the treatment options and will set her up for cystoscopy with left stenting today.  I will attempt ureteroscopy if the ureter is not too delicate but I told her that she will most likely need ESWL at a later date vs delayed ureterscopy. I reviewed the risks of bleeding, infection, ureteral injury, stent irritation,  thrombotic events and anesthestic risks.  I also reviewed the additional risks of ESWL including renal and adjacent organ injury that can be life threatening, failure of fragmentation and obstructing fragments requiring secondary procedures.   

## 2011-05-21 NOTE — Brief Op Note (Signed)
05/21/2011  8:11 PM  PATIENT:  Sarah Wong  46 y.o. female  PRE-OPERATIVE DIAGNOSIS:  left proximal stone  POST-OPERATIVE DIAGNOSIS:  left proximal stone  PROCEDURE:  Procedure(s) (LRB): CYSTOSCOPY WITH RETROGRADE PYELOGRAM/URETERAL STENT PLACEMENT (Left)  SURGEON:  Surgeon(s) and Role:    * Anner Crete, MD - Primary  PHYSICIAN ASSISTANT:   ASSISTANTS: none   ANESTHESIA:   general  EBL:     BLOOD ADMINISTERED:none  DRAINS: left 6x24 JJ stent.   LOCAL MEDICATIONS USED:  NONE  SPECIMEN:  No Specimen  DISPOSITION OF SPECIMEN:  N/A  COUNTS:  YES  TOURNIQUET:  * No tourniquets in log *  DICTATION: .Other Dictation: Dictation Number [id number]  PLAN OF CARE: Discharge to home after PACU  PATIENT DISPOSITION:  PACU - hemodynamically stable.   Delay start of Pharmacological VTE agent (>24hrs) due to surgical blood loss or risk of bleeding: no

## 2011-05-21 NOTE — Transfer of Care (Signed)
Immediate Anesthesia Transfer of Care Note  Patient: Sarah Wong  Procedure(s) Performed: Procedure(s) (LRB): CYSTOSCOPY WITH RETROGRADE PYELOGRAM/URETERAL STENT PLACEMENT (Left)  Patient Location: PACU  Anesthesia Type: General  Level of Consciousness: awake, alert , oriented and patient cooperative  Airway & Oxygen Therapy: Patient Spontanous Breathing and Patient connected to face mask oxygen  Post-op Assessment: Report given to PACU RN, Post -op Vital signs reviewed and stable and Patient moving all extremities  Post vital signs: Reviewed and stable  Complications: No apparent anesthesia complications

## 2011-05-21 NOTE — Progress Notes (Signed)
toprol not given pulse < 60

## 2011-05-21 NOTE — Preoperative (Signed)
Beta Blockers   Reason not to administer Beta Blockers:Beta blocker held during procedure d/t BP

## 2011-05-21 NOTE — Anesthesia Preprocedure Evaluation (Addendum)
Anesthesia Evaluation  Patient identified by MRN, date of birth, ID band Patient awake    Reviewed: Allergy & Precautions, H&P , NPO status , Patient's Chart, lab work & pertinent test results, reviewed documented beta blocker date and time   History of Anesthesia Complications (+) AWARENESS UNDER ANESTHESIANegative for: history of anesthetic complications  Airway Mallampati: II TM Distance: >3 FB Neck ROM: Full    Dental  (+) Teeth Intact and Dental Advisory Given   Pulmonary neg pulmonary ROS,  breath sounds clear to auscultation  Pulmonary exam normal       Cardiovascular negative cardio ROS  + Valvular Problems/Murmurs MVP Rhythm:Regular     Neuro/Psych negative neurological ROS  negative psych ROS   GI/Hepatic negative GI ROS, Neg liver ROS,   Endo/Other  negative endocrine ROS  Renal/GU negative Renal ROS  negative genitourinary   Musculoskeletal negative musculoskeletal ROS (+)   Abdominal   Peds  Hematology negative hematology ROS (+)   Anesthesia Other Findings   Reproductive/Obstetrics negative OB ROS                          Anesthesia Physical Anesthesia Plan  ASA: II and Emergent  Anesthesia Plan: General   Post-op Pain Management:    Induction: Intravenous  Airway Management Planned: LMA  Additional Equipment:   Intra-op Plan:   Post-operative Plan: Extubation in OR  Informed Consent: I have reviewed the patients History and Physical, chart, labs and discussed the procedure including the risks, benefits and alternatives for the proposed anesthesia with the patient or authorized representative who has indicated his/her understanding and acceptance.   Dental advisory given  Plan Discussed with: CRNA  Anesthesia Plan Comments:         Anesthesia Quick Evaluation

## 2011-05-21 NOTE — Anesthesia Postprocedure Evaluation (Signed)
Anesthesia Post Note  Patient: Sarah Wong  Procedure(s) Performed: Procedure(s) (LRB): CYSTOSCOPY WITH RETROGRADE PYELOGRAM/URETERAL STENT PLACEMENT (Left)  Anesthesia type: General  Patient location: PACU  Post pain: Pain level controlled  Post assessment: Post-op Vital signs reviewed  Last Vitals:  Filed Vitals:   05/21/11 2200  BP:   Pulse: 70  Temp: 37.8 C  Resp: 16    Post vital signs: Reviewed  Level of consciousness: sedated  Complications: No apparent anesthesia complications

## 2011-05-21 NOTE — Discharge Instructions (Signed)
Ureteral Stent  A ureteral stent is a soft plastic tube with multiple holes. The stent is inserted into a ureter to help drain urine from the kidney into the bladder. The tube has a coil on each end to keep it from falling out. One end stays in the kidney. The other end stays in the bladder. A stent cannot be seen from the outside. Usually it does not keep you from going about normal routines.  A ureteral stent is used to bypass a blockage in your kidney or ureter. This blockage can be caused by kidney stones, scar tissue, pregnancy, or other causes. It can also be used during treatment to remove a kidney stone or to let a ureter heal after surgery. The stent allows urine to drain from the kidney into the bladder. It is most often taken out after the blockage has been removed or the ureter has healed. If a stent is needed for a long time, it will be changed every few months.  INSERTING THE STENT  Your stent is put in by a urologist. This is a medical doctor trained for treating genitourinary (kidney, ureter and bladder) problems. Before your stent is put in, your caregiver may order x-rays or other imaging tests of your kidneys and ureters. The stent is inserted in a hospital or same day surgical center. You can anticipate going home the same day.  PROCEDURE   A special x-ray machine called a fluoroscope is used to guide the insertion of your stent. This allows your doctor to make sure the stent is in the correct place.   First you are given anesthesia to keep you comfortable.   Then your doctor inserts a special lighted instrument called a cystoscope into your bladder. This allows your doctor to see the opening to the ureter.   A thin wire is carefully threaded into the bladder and up the ureter. The stent is inserted over the wire and the wire is then removed.  HOME CARE INSTRUCTIONS    While the stent is in place, you may feel some discomfort. Certain movements may trigger pain or a feeling that you need to  urinate. Your caregiver may give you pain medication. Only take over-the-counter or prescription medicines for pain, discomfort, or fever as directed by your caregiver. Do not take aspirin as this can make bleeding worse.   You may be given medications to prevent infection or bladder spasms. Be sure to take all medications as directed.   Drink plenty of fluids.   You may have small amounts of bleeding causing your urine to be slightly red. This is nothing to be concerned about.  REMOVAL OF THE STENT  Your stent is left in until the blockage is resolved. This may take two weeks or longer. Before the stent is removed, you may have an x-ray make sure the ureter is open. The stent can be removed by your caregiver in the office. Medications may be given for comfort. Be sure to keep all follow-up appointments so your caregiver can check that you are healing properly.  SEEK IMMEDIATE MEDICAL CARE IF:    Your urine is dark red or has blood clots.   You are incontinent (leaking urine).   You have an oral temperature above 102 F (38.9 C), chills, nausea (feeling sick to your stomach), or vomiting.   Your pain is not relieved by pain medication. Do not take aspirin as this can make bleeding worse.   The end of the stent comes   out of the urethra.  Document Released: 12/16/1999 Document Revised: 12/07/2010 Document Reviewed: 12/15/2007  Exit[employer] Patient Information 2012 [employer].

## 2011-05-22 ENCOUNTER — Other Ambulatory Visit: Payer: Self-pay | Admitting: Urology

## 2011-05-22 MED FILL — Belladonna Alkaloids & Opium Suppos 16.2-60 MG: RECTAL | Qty: 1 | Status: AC

## 2011-05-22 NOTE — Op Note (Signed)
Sarah Wong, Sarah Wong              ACCOUNT NO.:  [id number]  MEDICAL RECORD NO.:  [id number]  LOCATION:  [facility]                         FACILITY:  [facility]  PHYSICIAN:  Excell Seltzer. Annabell Howells, M.D.    DATE OF BIRTH:  November 14, 1965  DATE OF PROCEDURE:  05/21/2011 DATE OF DISCHARGE:  05/21/2011                              OPERATIVE REPORT   PROCEDURES:  Cystoscopy, left retrograde pyelogram, and insertion of left double-J stent.  PREOPERATIVE DIAGNOSIS:  Left proximal ureteral stone.  POSTOPERATIVE DIAGNOSIS:  Left proximal ureteral stone.  SURGEON:  Excell Seltzer. Annabell Howells, M.D.  ANESTHESIA:  General.  SPECIMEN:  None.  DRAINS:  6-French x 26 cm left double-J stent.  COMPLICATIONS:  None.  INDICATIONS:  [name] is a 46 year old white female with a 5 mm left proximal stone that caused intractable pain since Friday.  She has elected to undergo stenting with ureteroscopy if possible, but probable lithotripsy in the future.  FINDINGS AND PROCEDURE:  She was given Cipro.  She was taken to the operating room, where general anesthetic was induced.  She was placed in lithotomy position.  Her perineum and genitalia were prepped with Betadine solution.  She was draped in the usual sterile fashion. Cystoscopy was performed using a 22-French scope and 12 degree lens. Examination revealed a normal urethra.  The bladder wall was smooth and pale without tumor, stones, or inflammation.  Ureteral orifices were in the normal anatomic position.  She did have a cystocele, which required downward angulation scope to visualize ureteral orifices.  Once a thorough cystoscopic inspection was performed, the left ureteral orifice was cannulated with a 5-French open-end catheter.  Omnipaque was instilled with very gentle pressure because of prior contrast allergy. This revealed a delicate ureter with a filling defect in the proximal ureter consistent with a stone and moderate dilation of collecting system proximal to  that.  At this point, a sensor guidewire was passed through the open-end catheter to the kidney __________ without difficulty.  The open-end catheter and the cystoscope was then removed.  I made one gentle attempt to pass the ureteroscope by the ureteral orifice, but was unsuccessful and felt that her ureter was sufficiently __________.  At this point, I elected to place a stent.  The cystoscope was reinserted over the guidewire and a 6-French x 26 cm stent was placed with some resistance to the level of the kidney confirming the delicate nature of the ureter.  Once the stent was in good position, the wire was removed leaving good coil in the kidney and a good coil in the bladder.  The bladder was drained.  The cystoscope was removed.  A B and O suppository was placed. The patient was taken down from lithotomy position.  Her anesthetic was reversed.  She was moved to recovery room in stable condition.  There were no complications.     Excell Seltzer. Annabell Howells, M.D.     JJW/MEDQ  D:  05/21/2011  T:  05/22/2011  Job:  [id number]

## 2011-05-23 ENCOUNTER — Encounter (HOSPITAL_COMMUNITY): Payer: Self-pay | Admitting: Urology

## 2011-05-24 ENCOUNTER — Encounter (HOSPITAL_COMMUNITY): Payer: Self-pay | Admitting: *Deleted

## 2011-05-31 ENCOUNTER — Encounter (HOSPITAL_COMMUNITY): Payer: Self-pay | Admitting: *Deleted

## 2011-05-31 ENCOUNTER — Ambulatory Visit (HOSPITAL_COMMUNITY)
Admission: RE | Admit: 2011-05-31 | Discharge: 2011-05-31 | Disposition: A | Discharge status: Home / Self Care | Payer: BC Managed Care – PPO | Source: Ambulatory Visit | Attending: Urology | Admitting: Urology

## 2011-05-31 ENCOUNTER — Ambulatory Visit (HOSPITAL_COMMUNITY): Payer: BC Managed Care – PPO

## 2011-05-31 ENCOUNTER — Encounter (HOSPITAL_COMMUNITY)
Admission: RE | Disposition: A | Discharge status: Home / Self Care | Payer: Self-pay | Source: Ambulatory Visit | Attending: Urology

## 2011-05-31 DIAGNOSIS — Z79899 Other long term (current) drug therapy: Secondary | ICD-10-CM | POA: Insufficient documentation

## 2011-05-31 DIAGNOSIS — G4730 Sleep apnea, unspecified: Secondary | ICD-10-CM | POA: Insufficient documentation

## 2011-05-31 DIAGNOSIS — K219 Gastro-esophageal reflux disease without esophagitis: Secondary | ICD-10-CM | POA: Insufficient documentation

## 2011-05-31 DIAGNOSIS — N201 Calculus of ureter: Secondary | ICD-10-CM | POA: Insufficient documentation

## 2011-05-31 DIAGNOSIS — E669 Obesity, unspecified: Secondary | ICD-10-CM | POA: Insufficient documentation

## 2011-05-31 SURGERY — LITHOTRIPSY, ESWL
Anesthesia: LOCAL | Laterality: Left

## 2011-05-31 MED ORDER — CIPROFLOXACIN HCL 500 MG PO TABS
500.0000 mg | ORAL_TABLET | ORAL | Status: AC
  Administered 2011-05-31: 500 mg via ORAL

## 2011-05-31 MED ORDER — CIPROFLOXACIN HCL 500 MG PO TABS
ORAL_TABLET | ORAL | Status: AC
  Administered 2011-05-31: 500 mg via ORAL
  Filled 2011-05-31: qty 1

## 2011-05-31 MED ORDER — DIAZEPAM 5 MG PO TABS
ORAL_TABLET | ORAL | Status: AC
  Administered 2011-05-31: 10 mg via ORAL
  Filled 2011-05-31: qty 2

## 2011-05-31 MED ORDER — DEXTROSE-NACL 5-0.45 % IV SOLN
INTRAVENOUS | Status: DC
  Administered 2011-05-31: 07:00:00 via INTRAVENOUS

## 2011-05-31 MED ORDER — DIPHENHYDRAMINE HCL 25 MG PO CAPS
ORAL_CAPSULE | ORAL | Status: AC
  Administered 2011-05-31: 25 mg via ORAL
  Filled 2011-05-31: qty 1

## 2011-05-31 MED ORDER — DIPHENHYDRAMINE HCL 25 MG PO CAPS
25.0000 mg | ORAL_CAPSULE | ORAL | Status: AC
  Administered 2011-05-31: 25 mg via ORAL

## 2011-05-31 MED ORDER — DIAZEPAM 5 MG PO TABS
10.0000 mg | ORAL_TABLET | ORAL | Status: AC
  Administered 2011-05-31: 10 mg via ORAL

## 2011-05-31 NOTE — Discharge Instructions (Signed)
Lithotripsy Care After Refer to this sheet for the next few weeks. These discharge instructions provide you with general information on caring for yourself after you leave the hospital. Your caregiver may also give you specific instructions. Your treatment has been planned according to the most current medical practices available, but unavoidable complications sometimes occur. If you have any problems or questions after discharge, please call your caregiver. AFTER THE PROCEDURE   The recovery time will vary with the procedure done.   You will be taken to the recovery area. A nurse will watch and check your progress. Once you are awake, stable, and taking fluids well, you will be allowed to go home as long as there are no problems.   Your urine may have a red tinge for a few days after treatment. Blood loss is usually minimal.   You may have soreness in the back or flank area. This usually goes away after a few days. The procedure can cause blotches or bruises on the back where the pressure wave enters the skin. These marks usually cause only minimal discomfort and should disappear in a short time.   Stone fragments should begin to pass within 24 hours of treatment. However, a delayed passage is not unusual.   You may have pain, discomfort, and feel sick to your stomach (nauseous) when the crushed (pulverized) fragments of stone are passed down the tube from the kidney to the bladder. Stone fragments can pass soon after the procedure and may last for up to 4 to 8 weeks.   A small number of patients may have severe pain when stone fragments are not able to pass, which leads to an obstruction.   If your stone is greater than 1 inch/2.5 centimeters in diameter or if you have multiple stones that have a combined diameter greater than 1 inch/2.5 centimeters, you may require more than 1 treatment.   You must have someone drive you home.  HOME CARE INSTRUCTIONS   Rest at home until you feel your  energy improving.   Only take over-the-counter or prescription medicines for pain, discomfort, or fever as directed by your caregiver. Depending on the type of lithotripsy, you may need to take medicines (antibiotics) that kill germs and anti-inflammatory medicines for a few days.   Drink enough water and fluids to keep your urine clear or pale yellow. This helps "flush" your kidneys. It helps pass any remaining pieces of stone and prevents stones from coming back.   Most people can resume daily activities within 1 or 2 days after standard lithotripsy. It can take longer to recover from laser and percutaneous lithotripsy.   If the stones are in your urinary system, you may be asked to strain your urine at home to look for stones. Any stones that are found can be sent to a medical lab for examination.   Visit your caregiver for a follow-up appointment in a few weeks. Your doctor may remove your stent if you have one. Your caregiver will also check to see whether stone particles still remain.  SEEK MEDICAL CARE IF:   You have an oral temperature above 102 F (38.9 C).   Your pain is not relieved by medicine.   You have a lasting nauseous feeling.   You feel there is too much blood in the urine.   You develop persistent problems with frequent and/or painful urination that does not at least partially improve after 2 days following the procedure.   You have a congested   cough.   You feel lightheaded.   You develop a rash or any other signs that might suggest an allergic problem.   You develop any reaction or side effects to your medicine(s).  SEEK IMMEDIATE MEDICAL CARE IF:   You experience severe back and/or flank pain.   You see nothing but blood when you urinate.   You cannot pass any urine at all.   You have an oral temperature above 102 F (38.9 C), not controlled by medicine.   You develop shortness of breath, difficulty breathing, or chest pain.   You develop vomiting  that will not stop after 6 to 8 hours.   You have a fainting episode.  MAKE SURE YOU:   Understand these instructions.   Will watch your condition.   Will get help right away if you are not doing well or get worse.  Document Released: 01/07/2007 Document Revised: 12/07/2010 Document Reviewed: 01/07/2007 [employer] Patient Information 2012 [employer]. 

## 2011-05-31 NOTE — Interval H&P Note (Signed)
History and Physical Interval Note:  05/31/2011 7:39 AM  Sarah Wong  has presented today for surgery, with the diagnosis of Left Proximal Stone  The various methods of treatment have been discussed with the patient and family. After consideration of risks, benefits and other options for treatment, the patient has consented to  Procedure(s) (LRB): EXTRACORPOREAL SHOCK WAVE LITHOTRIPSY (ESWL) (Left) as a surgical intervention .  The patients' history has been reviewed, patient examined, no change in status, stable for surgery.  I have reviewed the patients' chart and labs.  Questions were answered to the patient's satisfaction.     Nayely Dingus J

## 2011-05-31 NOTE — H&P (View-Only) (Signed)
ctive Problems Problems  1. Nephrolithiasis Of Both Kidneys 592.0 2. Ureteral Stone Left 592.1  History of Present Illness        Sarah Wong is a 46 yo WF who had the onset of severe left flank pain on Saturday AM.  She was seen in the ER and had a CT that showed a 5mm LUPJ stone.   She got pain relief with the med but the pain recurred and remains severe since 3 am.  She has nausea and vomiting as well. She is voiding without complaints. She may have had a prior stone but no other GU history. She has no other associated signs or symptoms.   Past Medical History Problems  1. History of  Adult Sleep Apnea 780.57 2. History of  Esophageal Reflux 530.81 3. History of  Murmurs 785.2 4. History of  Nephrolithiasis V13.01 5. History of  Prolapsing Mitral Valve Leaflet Syndrome 424.0  Surgical History Problems  1. History of  Hysterectomy V45.77 2. History of  Wrist Surgery Right   G2P2 NVD   Current Meds 1. Diltiazem HCl TABS; Therapy: (Recorded:20May2013) to 2. Metoprolol Tartrate TABS; Therapy: (Recorded:20May2013) to 3. Pantoprazole Sodium 40 MG Oral Tablet Delayed Release; Therapy: (Recorded:20May2013) to  Allergies Medication  1. Penicillins 2. Contrast Media Ready-Box MISC 3. Vancomycin HCl Powder  Family History Problems  1. Paternal history of  Blood Disorders V18.3 2. Family history of  Death In The Family Mother age 62 unknown 3. Maternal history of  Diabetes Mellitus V18.0 4. Family history of  Family Health Status Number Of Children 2 daughters 5. Family history of  Heart Disease V17.49 6. Maternal history of  Hypertension V17.49 7. Paternal history of  Nephritis  Social History Problems    Alcohol Use 2 glasses per month   Caffeine Use coke and pepsi x 5 and tea.   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  Genitourinary: urinary frequency and nocturia.  Gastrointestinal: nausea, vomiting, flank pain and heartburn.  Constitutional: feeling tired (fatigue).  ENT: sore throat.  Musculoskeletal: back pain.  Neurological: headache.    Vitals Vital Signs [Data Includes: Last 1 Day]  20May2013 03:12PM  BMI Calculated: 32.39 BSA Calculated: 1.72 Height: 5 ft  Weight: 165 lb  Blood Pressure: 130 / 81 Temperature: 98.6 F Heart Rate: 61  Physical Exam Constitutional: Well nourished and well developed . No acute distress.  Neck: The appearance of the neck is normal and no neck mass is present.  Pulmonary: No respiratory distress and normal respiratory rhythm and effort.  Cardiovascular: Heart rate and rhythm are normal . No peripheral edema.  Abdomen: The abdomen is soft and nontender. No masses are palpated. No right CVA tenderness and moderate left CVA tenderness. No hernias are palpable. No hepatosplenomegaly noted.  Skin: Normal skin turgor, no visible rash and no visible skin lesions.  Neuro/Psych:. Mood and affect are appropriate.    Results/Data Urine [Data Includes: Last 1 Day]   20May2013  COLOR YELLOW   APPEARANCE CLEAR   SPECIFIC GRAVITY 1.010   pH 6.0   GLUCOSE NEG mg/dL  BILIRUBIN NEG   KETONE NEG mg/dL  BLOOD MOD   PROTEIN NEG mg/dL  UROBILINOGEN 0.2 mg/dL  NITRITE NEG  LEUKOCYTE ESTERASE NEG   SQUAMOUS EPITHELIAL/HPF MODERATE   WBC 0-3 WBC/hpf  RBC 4-6 RBC/hpf  BACTERIA FEW   CRYSTALS NONE SEEN   CASTS NONE SEEN    The following images/tracing/specimen were independently visualized:  KUB today shows a 5mm stone in the area of the left proximal ureter. She has a small LLP stone visible. There is contrast in the colon that obscures the right kidney. No other  abnormalities are noted. I have reviewed her CT films and report.    Assessment Assessed  1. Ureteral Stone Left 592.1 2. Nephrolithiasis Of Both Kidneys 592.0   She has a 5mm left UPJ stone with intractable pain.   She also has small bilateral renal stones.   Plan Health Maintenance (V70.0)  1. UA With REFLEX  Done: 20May2013 02:44PM Ureteral Stone (592.1)  2. Morphine Sulfate 15 MG/ML Injection Solution; INJECT 10  MG Intramuscular; To Be Done:  20May2013; Status: HOLD FOR - Administration 3. Promethazine HCl 25 MG/ML Injection Solution; INJECT 25 MG Intramuscular; To Be Done:  20May2013; Status: HOLD FOR - Administration 4. KUB  Done: 20May2013 12:00AM 5. Follow-up Schedule Surgery Office  Follow-up  Requested for: 20May2013   She was given Morphine 10mg  and Phenergan 25mg  IM for pain relief. I discussed the treatment options and will set her up for cystoscopy with left stenting today.  I will attempt ureteroscopy if the ureter is not too delicate but I told her that she will most likely need ESWL at a later date vs delayed ureterscopy. I reviewed the risks of bleeding, infection, ureteral injury, stent irritation,  thrombotic events and anesthestic risks.  I also reviewed the additional risks of ESWL including renal and adjacent organ injury that can be life threatening, failure of fragmentation and obstructing fragments requiring secondary procedures.

## 2011-06-01 ENCOUNTER — Encounter (HOSPITAL_COMMUNITY): Payer: Self-pay

## 2011-12-02 HISTORY — PX: NASAL SEPTUM SURGERY: SHX37

## 2011-12-20 ENCOUNTER — Other Ambulatory Visit: Payer: Self-pay | Admitting: Otolaryngology

## 2012-02-24 ENCOUNTER — Encounter: Payer: Self-pay | Admitting: *Deleted

## 2012-03-20 ENCOUNTER — Other Ambulatory Visit (HOSPITAL_COMMUNITY): Payer: Self-pay | Admitting: Cardiovascular Disease

## 2012-03-20 DIAGNOSIS — I341 Nonrheumatic mitral (valve) prolapse: Secondary | ICD-10-CM

## 2012-03-20 DIAGNOSIS — R011 Cardiac murmur, unspecified: Secondary | ICD-10-CM

## 2012-03-20 DIAGNOSIS — R002 Palpitations: Secondary | ICD-10-CM

## 2012-03-27 ENCOUNTER — Ambulatory Visit (HOSPITAL_COMMUNITY)
Admission: RE | Admit: 2012-03-27 | Discharge: 2012-03-27 | Disposition: A | Discharge status: Home / Self Care | Payer: BC Managed Care – PPO | Source: Ambulatory Visit | Attending: Cardiovascular Disease | Admitting: Cardiovascular Disease

## 2012-03-27 DIAGNOSIS — R0609 Other forms of dyspnea: Secondary | ICD-10-CM | POA: Insufficient documentation

## 2012-03-27 DIAGNOSIS — I079 Rheumatic tricuspid valve disease, unspecified: Secondary | ICD-10-CM | POA: Insufficient documentation

## 2012-03-27 DIAGNOSIS — I059 Rheumatic mitral valve disease, unspecified: Secondary | ICD-10-CM | POA: Insufficient documentation

## 2012-03-27 DIAGNOSIS — R011 Cardiac murmur, unspecified: Secondary | ICD-10-CM

## 2012-03-27 DIAGNOSIS — I341 Nonrheumatic mitral (valve) prolapse: Secondary | ICD-10-CM

## 2012-03-27 DIAGNOSIS — R0989 Other specified symptoms and signs involving the circulatory and respiratory systems: Secondary | ICD-10-CM | POA: Insufficient documentation

## 2012-03-27 DIAGNOSIS — R002 Palpitations: Secondary | ICD-10-CM

## 2012-03-27 HISTORY — PX: TRANSTHORACIC ECHOCARDIOGRAM: SHX275

## 2012-03-27 NOTE — Progress Notes (Signed)
2D Echo Performed 03/27/2012    Clark Cuff, RCS  

## 2012-07-07 ENCOUNTER — Encounter: Payer: Self-pay | Admitting: Family

## 2012-07-07 ENCOUNTER — Ambulatory Visit (INDEPENDENT_AMBULATORY_CARE_PROVIDER_SITE_OTHER): Payer: BC Managed Care – PPO | Admitting: Family

## 2012-07-07 VITALS — BP 120/90 | HR 75 | Wt 171.0 lb

## 2012-07-07 DIAGNOSIS — W57XXXA Bitten or stung by nonvenomous insect and other nonvenomous arthropods, initial encounter: Secondary | ICD-10-CM

## 2012-07-07 DIAGNOSIS — S30860A Insect bite (nonvenomous) of lower back and pelvis, initial encounter: Secondary | ICD-10-CM

## 2012-07-07 DIAGNOSIS — S30861A Insect bite (nonvenomous) of abdominal wall, initial encounter: Secondary | ICD-10-CM

## 2012-07-07 NOTE — Progress Notes (Signed)
Subjective:    Patient ID: Sarah Wong, female    DOB: [birth year], 47 y.o.   MRN: [id number]  HPI  47 year old white female, nonsmoker, is in today with a tick bite x 2 days. She denies any fever, muscle aches, or pain. Has attempted to remove but unsuccessful.   Review of Systems  Constitutional: Negative.   Respiratory: Negative.   Cardiovascular: Negative.   Gastrointestinal: Negative.   Musculoskeletal: Negative.   Skin: Negative.   Neurological: Negative.   Hematological: Negative.    Past Medical History  Diagnosis Date  . Heart murmur   . kidney calculus   . echocardiogram     echo 07/06/10- EF>55%, no significant valvular dz, no MVP  . GERD (gastroesophageal reflux disease)   . Splenic flexure syndrome   . Palpitations     possible h/o SVT; has worn multiple monitors since 1998, most recent was 2002=no significant arrhythmias  . Fatigue     sleep study 07/07/10- AHI 3/hr REM AHI 6.1/hr; CPAP not recommended  . Chest pain     stress myoview (11-08-09)- normal study; cardiac CTA with calcium score (12/03/06)-normal LV and RV size and systolic function, coronary calcium score=0  . Abdominal pain     abd [place] (05-10-04)- mild generalized increasein schogenicit but not focal abnormalities in the liver, gallbladder=normal    History   Social History  . Marital Status: Married    Spouse Name: N/A    Number of Children: 2  . Years of Education: N/A   Occupational History  . Not on file.   Social History Main Topics  . Smoking status: Never Smoker   . Smokeless tobacco: Never Used  . Alcohol Use: No  . Drug Use: No  . Sexually Active: Not on file   Other Topics Concern  . Not on file   Social History Narrative  . No narrative on file    Past Surgical History  Procedure Laterality Date  . Abdominal hysterectomy    . Nasal septum surgery    . Wrist ganglion excision    . Cystoscopy w/ ureteral stent placement  05/21/2011    Procedure: CYSTOSCOPY WITH  RETROGRADE PYELOGRAM/URETERAL STENT PLACEMENT;  Surgeon: Anner Crete, MD;  Location: [facility];  Service: Urology;  Laterality: Left;    Family History  Problem Relation Age of Onset  . Heart disease      grandmother  . Hypertension Mother   . Hypertension Father   . Diabetes Mother   . COPD Mother   . Heart failure Father     open heart surgery  . Stroke Maternal Grandmother   . Heart failure Mother     open heart surgery    Allergies  Allergen Reactions  . Iohexol      Code: HIVES, Desc: HIVES WITH IV CM- PT REQUIRES 13 HR PRE-MEDS, Onset Date: [id number]   . Penicillins     REACTION: hives  . Vancomycin Swelling    Current Outpatient Prescriptions on File Prior to Visit  Medication Sig Dispense Refill  . diltiazem (DILACOR XR) 180 MG 24 hr capsule Take 180 mg by mouth daily.      . metoprolol succinate (TOPROL-XL) 25 MG 24 hr tablet Take 25 mg by mouth 2 (two) times daily.      . pantoprazole (PROTONIX) 40 MG tablet Take 40 mg by mouth 2 (two) times daily.       . hyoscyamine (LEVSIN/SL) 0.125 MG SL tablet Place 1 tablet (  0.125 mg total) under the tongue every 4 (four) hours as needed for cramping.  30 tablet  0  . Tamsulosin HCl (FLOMAX) 0.4 MG CAPS Take 1 capsule (0.4 mg total) by mouth daily.  30 capsule  0   No current facility-administered medications on file prior to visit.    BP 120/90  Pulse 75  Wt 171 lb (77.565 kg)  BMI 33.4 kg/m2  SpO2 95%chart    Objective:   Physical Exam  Constitutional: She appears well-developed and well-nourished.  HENT:  Right Ear: External ear normal.  Left Ear: External ear normal.  Nose: Nose normal.  Mouth/Throat: Oropharynx is clear and moist.  Cardiovascular: Normal rate, regular rhythm and normal heart sounds.   Pulmonary/Chest: Effort normal and breath sounds normal.  Abdominal: Soft. Bowel sounds are normal.  Left lateral abdomen. Deer take intact. Removed with forceps completely.  Musculoskeletal: Normal range of  motion.  Neurological: She is alert.  Skin: Skin is warm and dry.  Psychiatric: She has a normal mood and affect.          Assessment & Plan:  Assessment: 1. Tick Bite  Plan: Discussed signs and symptoms of [place] spotted fever and Lyme disease. Patient verbalizes understanding. Call the office with any questions or concerns. Recheck as scheduled and as needed.

## 2012-09-03 ENCOUNTER — Other Ambulatory Visit: Payer: Self-pay | Admitting: *Deleted

## 2012-09-03 MED ORDER — METOPROLOL SUCCINATE ER 25 MG PO TB24: 25.0000 mg | ORAL_TABLET | Freq: Two times a day (BID) | ORAL | Status: DC

## 2012-09-03 NOTE — Telephone Encounter (Signed)
Called rx into pharmacy Pt aware

## 2012-09-16 ENCOUNTER — Telehealth: Payer: Self-pay | Admitting: Family Medicine

## 2012-09-16 NOTE — Telephone Encounter (Signed)
Pt's cardiologist is retiring and would like you to refer to someone w/ [facility].

## 2012-09-16 NOTE — Telephone Encounter (Signed)
I would suggest Dr Antoine Poche or Dr Clifton James or Dr Excell Seltzer

## 2012-09-16 NOTE — Telephone Encounter (Signed)
Left message for patient to return call.

## 2012-09-17 NOTE — Telephone Encounter (Signed)
Called and spoke with pt and pt is aware.  

## 2012-10-21 ENCOUNTER — Other Ambulatory Visit: Payer: Self-pay | Admitting: Urology

## 2012-10-21 ENCOUNTER — Encounter (HOSPITAL_COMMUNITY): Payer: Self-pay | Admitting: Pharmacy Technician

## 2012-10-23 ENCOUNTER — Ambulatory Visit: Payer: BC Managed Care – PPO | Admitting: Cardiology

## 2012-10-24 ENCOUNTER — Encounter (HOSPITAL_COMMUNITY): Payer: Self-pay | Admitting: *Deleted

## 2012-10-29 NOTE — H&P (Signed)
ctive Problems Problems  1. Abdominal Pain In The Right Upper Belly (RUQ) 789.01 2. Asymptomatic Hyperuricemia 790.6 3. Nephrolithiasis Of Both Kidneys 592.0 4. Stress Incontinence 788.39  History of Present Illness     Sarah Wong returns today in f/u.  She had the onset this morning of right lower quadrant pain.   She has had gross hematuria since.  The pain is moderate with some nausea.   She has no urgency or frequency.  She has TNTC RBC's.  KUB today shows a 6mm right proximal stone and a 3mm RLP stone.   Past Medical History Problems  1. History of  Adult Sleep Apnea 780.57 2. History of  Esophageal Reflux 530.81 3. History of  Murmurs 785.2 4. History of  Nephrolithiasis V13.01 5. History of  Prolapsing Mitral Valve Leaflet Syndrome 424.0 6. History of  Ureteral Stone Left 592.1  Surgical History Problems  1. History of  Cystoscopy With Insertion Of Ureteral Stent Left 2. History of  Hysterectomy V45.77 3. History of  Lithotripsy 4. History of  Rhinologic Surgery 5. History of  Uvulectomy 6. History of  Wrist Surgery Right  Current Meds 1. Allopurinol 100 MG Oral Tablet; 1QD - TAKE ONE TABLET BY MOUTH EVERY DAY; Therapy:  03Jun2014 to (Evaluate:29May2015)  Requested for: 03Jun2014; Last Rx:03Jun2014 2. Crestor 10 MG Oral Tablet; Therapy: (Recorded:03Oct2014) to 3. Diltiazem HCl TABS; Therapy: (Recorded:20May2013) to 4. Magnesium TABS; Therapy: (Recorded:02Jun2014) to 5. Metoprolol Tartrate TABS; Therapy: (Recorded:20May2013) to 6. Pantoprazole Sodium 40 MG Oral Tablet Delayed Release; Therapy: (Recorded:20May2013) to 7. Urocit-K 15 15 MEQ (1620 MG) Oral Tablet Extended Release; Take 1 tablet twice daily; Therapy:  26Feb2014 to (Evaluate:21Feb2015)  Requested for: 26Feb2014; Last Rx:26Feb2014  Allergies Medication  1. Penicillins 2. Contrast Media Ready-Box MISC 3. Vancomycin HCl Powder  Family History Problems  1. Paternal history of  Blood Disorders V18.3 2.  Family history of  Death In The Family Mother age 54 unknown 3. Maternal history of  Diabetes Mellitus V18.0 4. Family history of  Family Health Status Number Of Children 2 daughters 5. Family history of  Gout V18.19 6. Family history of  Heart Disease V17.49 7. Maternal history of  Hypertension V17.49 8. Paternal history of  Nephritis  Social History Problems  1. Alcohol Use 2 glasses per month 2. Caffeine Use coke and pepsi x 5 and tea. but she is trying to cut back. 3. Marital History - Currently Married 4. Never A Smoker 5. 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 History of  Tobacco Use 305.1      Past, family and social history reviewed and updated.   Review of Systems Genitourinary, constitutional, skin, eye, otolaryngeal, hematologic/lymphatic, cardiovascular, pulmonary, endocrine, musculoskeletal, gastrointestinal, neurological and psychiatric system(s) were reviewed and pertinent findings if present are noted.  Genitourinary: hematuria.  Gastrointestinal: nausea and flank pain.  Constitutional: no fever (but she felt flushed. ).  Cardiovascular: leg swelling.    Vitals Vital Signs [Data Includes: Last 1 Day]  20Oct2014 03:58PM  Blood Pressure: 124 / 75 Temperature: 97.2 F Heart Rate: 51  Physical Exam Constitutional: Well nourished and well developed . No acute distress.  Pulmonary: No respiratory distress and normal respiratory rhythm and effort.  Cardiovascular: Heart rate and rhythm are normal . No peripheral edema.  Abdomen: The abdomen is mildly obese. Moderate tenderness in the RLQ is present. moderate right CVA tenderness, but no left CVA tenderness.    Results/Data Urine [Data Includes: Last 1 Day]   20Oct2014  COLOR RED  APPEARANCE CLOUDY   SPECIFIC GRAVITY 1.020   pH 7.0   GLUCOSE NEG mg/dL  BILIRUBIN NEG   KETONE NEG mg/dL  BLOOD LARGE   PROTEIN NEG mg/dL  UROBILINOGEN 0.2 mg/dL  NITRITE NEG   LEUKOCYTE ESTERASE NEG   SQUAMOUS EPITHELIAL/HPF  RARE   WBC 0-2 WBC/hpf  RBC TNTC RBC/hpf  BACTERIA NONE SEEN   CRYSTALS NONE SEEN   CASTS NONE SEEN    The following images/tracing/specimen were independently visualized:  KUB today shows a 3-72mm RLP stone and a 5x2mm right proximal stone. I don't see any left renal stones. She has a stable right pelvic phlebolith but no other significant abnormalities.  The following clinical lab reports were reviewed:  UA reviewed.    Assessment Assessed  1. Ureteral Stone Right 592.1 2. Abdominal Pain In The Right Upper Belly (RUQ) 789.01 3. Nephrolithiasis Of Both Kidneys 592.0   Her RUP stone has moved into the proximal ureter and is causing pain and bleeding.   Plan Health Maintenance (V70.0)  1. UA With REFLEX  Done: 20Oct2014 03:39PM Nephrolithiasis Of Both Kidneys (592.0)  2. KUB  Done: 20Oct2014 12:00AM Ureteral Stone (592.1)  3. Ondansetron 4 MG Oral Tablet Dispersible; TAKE 1 TABLET Every 6 hours PRN nausea can take  1-2 tabs; Therapy: 20Oct2014 to (Last Rx:20Oct2014) 4. Oxycodone-Acetaminophen 5-325 MG Oral Tablet; take 1 or 2 tablets q 4-6 hours prn pain;  Therapy: 20Oct2014 to (Last Rx:20Oct2014) 5. Tamsulosin HCl 0.4 MG Oral Capsule; TAKE 1 CAPSULE EVERY DAY; Therapy: 20Oct2014 to  (Evaluate:19Dec2014); Last Rx:20Oct2014 6. Follow-up Schedule Surgery Office  Follow-up  Requested for: 20Oct2014   I discussed the options with her and will get her started on tamsulosin and a strainer.  I have given her scripts for percocet and zofran and will set her up for ESWL later this week or early next week.  Her prior stone was this size and wouldn't pass. I reviewed the risks of bleeding, infection, renal and adjacent organ injury, failure of adequate fragmentation with secondary procedures, thrombotic events and sedation risks.

## 2012-10-30 ENCOUNTER — Encounter (HOSPITAL_COMMUNITY)
Admission: RE | Disposition: A | Discharge status: Home / Self Care | Payer: Self-pay | Source: Ambulatory Visit | Attending: Urology

## 2012-10-30 ENCOUNTER — Encounter (HOSPITAL_COMMUNITY): Payer: Self-pay | Admitting: *Deleted

## 2012-10-30 ENCOUNTER — Ambulatory Visit (HOSPITAL_COMMUNITY)
Admission: RE | Admit: 2012-10-30 | Discharge: 2012-10-30 | Disposition: A | Discharge status: Home / Self Care | Payer: BC Managed Care – PPO | Source: Ambulatory Visit | Attending: Urology | Admitting: Urology

## 2012-10-30 ENCOUNTER — Ambulatory Visit (HOSPITAL_COMMUNITY): Payer: BC Managed Care – PPO

## 2012-10-30 DIAGNOSIS — K219 Gastro-esophageal reflux disease without esophagitis: Secondary | ICD-10-CM | POA: Insufficient documentation

## 2012-10-30 DIAGNOSIS — G4730 Sleep apnea, unspecified: Secondary | ICD-10-CM | POA: Insufficient documentation

## 2012-10-30 DIAGNOSIS — E669 Obesity, unspecified: Secondary | ICD-10-CM | POA: Insufficient documentation

## 2012-10-30 DIAGNOSIS — Z79899 Other long term (current) drug therapy: Secondary | ICD-10-CM | POA: Insufficient documentation

## 2012-10-30 DIAGNOSIS — N201 Calculus of ureter: Secondary | ICD-10-CM | POA: Insufficient documentation

## 2012-10-30 DIAGNOSIS — N200 Calculus of kidney: Secondary | ICD-10-CM | POA: Insufficient documentation

## 2012-10-30 SURGERY — LITHOTRIPSY, ESWL
Anesthesia: LOCAL | Laterality: Right

## 2012-10-30 MED ORDER — DEXTROSE-NACL 5-0.45 % IV SOLN: INTRAVENOUS | Status: DC

## 2012-10-30 MED ORDER — DIPHENHYDRAMINE HCL 25 MG PO CAPS
25.0000 mg | ORAL_CAPSULE | ORAL | Status: AC
  Administered 2012-10-30: 25 mg via ORAL
  Filled 2012-10-30: qty 1

## 2012-10-30 MED ORDER — CIPROFLOXACIN HCL 500 MG PO TABS
500.0000 mg | ORAL_TABLET | ORAL | Status: AC
  Administered 2012-10-30: 500 mg via ORAL
  Filled 2012-10-30: qty 1

## 2012-10-30 MED ORDER — DIAZEPAM 5 MG PO TABS
10.0000 mg | ORAL_TABLET | ORAL | Status: AC
  Administered 2012-10-30: 10 mg via ORAL
  Filled 2012-10-30: qty 2

## 2012-10-30 NOTE — Interval H&P Note (Signed)
History and Physical Interval Note:  10/30/2012 2:07 PM  Sarah Wong  has presented today for surgery, with the diagnosis of RIGHT PROXIMAL STONE  The various methods of treatment have been discussed with the patient and family. After consideration of risks, benefits and other options for treatment, the patient has consented to  Procedure(s): RIGHT EXTRACORPOREAL SHOCK WAVE LITHOTRIPSY (ESWL) (Right) as a surgical intervention .  The patient's history has been reviewed, patient examined, no change in status, stable for surgery.  I have reviewed the patient's chart and labs.  Questions were answered to the patient's satisfaction.     Katheleen Stella J

## 2012-11-03 ENCOUNTER — Ambulatory Visit: Payer: BC Managed Care – PPO | Admitting: Cardiology

## 2012-11-12 ENCOUNTER — Other Ambulatory Visit: Payer: Self-pay | Admitting: Urology

## 2012-11-17 ENCOUNTER — Encounter (HOSPITAL_BASED_OUTPATIENT_CLINIC_OR_DEPARTMENT_OTHER): Payer: Self-pay | Admitting: *Deleted

## 2012-11-18 ENCOUNTER — Encounter (HOSPITAL_BASED_OUTPATIENT_CLINIC_OR_DEPARTMENT_OTHER): Payer: Self-pay | Admitting: *Deleted

## 2012-11-18 NOTE — Progress Notes (Signed)
NPO AFTER MN. ARRIVE AT 1000. NEEDS ISTAT, EKG , AND KUB. WILL TAKE CRESTOR, METOPROLOL, DILTIAZEM, AND PROTONIX AM DOS W/ SIPS OF WATER AND IF NEEDED MAY TAKE PERCOCET/ ZOFRAN.

## 2012-11-20 ENCOUNTER — Encounter (HOSPITAL_BASED_OUTPATIENT_CLINIC_OR_DEPARTMENT_OTHER): Payer: BC Managed Care – PPO | Admitting: Anesthesiology

## 2012-11-20 ENCOUNTER — Ambulatory Visit (HOSPITAL_COMMUNITY): Payer: BC Managed Care – PPO

## 2012-11-20 ENCOUNTER — Encounter (HOSPITAL_BASED_OUTPATIENT_CLINIC_OR_DEPARTMENT_OTHER)
Admission: RE | Disposition: A | Discharge status: Home / Self Care | Payer: Self-pay | Source: Ambulatory Visit | Attending: Urology

## 2012-11-20 ENCOUNTER — Encounter (HOSPITAL_BASED_OUTPATIENT_CLINIC_OR_DEPARTMENT_OTHER): Payer: Self-pay | Admitting: *Deleted

## 2012-11-20 ENCOUNTER — Ambulatory Visit (HOSPITAL_BASED_OUTPATIENT_CLINIC_OR_DEPARTMENT_OTHER): Payer: BC Managed Care – PPO | Admitting: Anesthesiology

## 2012-11-20 ENCOUNTER — Ambulatory Visit (HOSPITAL_BASED_OUTPATIENT_CLINIC_OR_DEPARTMENT_OTHER)
Admission: RE | Admit: 2012-11-20 | Discharge: 2012-11-20 | Disposition: A | Discharge status: Home / Self Care | Payer: BC Managed Care – PPO | Source: Ambulatory Visit | Attending: Urology | Admitting: Urology

## 2012-11-20 DIAGNOSIS — G4730 Sleep apnea, unspecified: Secondary | ICD-10-CM | POA: Insufficient documentation

## 2012-11-20 DIAGNOSIS — Z90710 Acquired absence of both cervix and uterus: Secondary | ICD-10-CM | POA: Insufficient documentation

## 2012-11-20 DIAGNOSIS — Z79899 Other long term (current) drug therapy: Secondary | ICD-10-CM | POA: Insufficient documentation

## 2012-11-20 DIAGNOSIS — N200 Calculus of kidney: Secondary | ICD-10-CM | POA: Insufficient documentation

## 2012-11-20 DIAGNOSIS — I059 Rheumatic mitral valve disease, unspecified: Secondary | ICD-10-CM | POA: Insufficient documentation

## 2012-11-20 DIAGNOSIS — K219 Gastro-esophageal reflux disease without esophagitis: Secondary | ICD-10-CM | POA: Insufficient documentation

## 2012-11-20 DIAGNOSIS — R7989 Other specified abnormal findings of blood chemistry: Secondary | ICD-10-CM | POA: Insufficient documentation

## 2012-11-20 DIAGNOSIS — N201 Calculus of ureter: Secondary | ICD-10-CM | POA: Insufficient documentation

## 2012-11-20 DIAGNOSIS — N393 Stress incontinence (female) (male): Secondary | ICD-10-CM | POA: Insufficient documentation

## 2012-11-20 HISTORY — DX: Obstructive sleep apnea (adult) (pediatric): G47.33

## 2012-11-20 HISTORY — DX: Calculus of kidney: N20.0

## 2012-11-20 HISTORY — DX: Personal history of urinary calculi: Z87.442

## 2012-11-20 HISTORY — DX: Calculus of ureter: N20.1

## 2012-11-20 HISTORY — PX: CYSTOSCOPY WITH URETEROSCOPY, STONE BASKETRY AND STENT PLACEMENT: SHX6378

## 2012-11-20 HISTORY — PX: HOLMIUM LASER APPLICATION: SHX5852

## 2012-11-20 HISTORY — DX: Hyperlipidemia, unspecified: E78.5

## 2012-11-20 HISTORY — DX: Presence of spectacles and contact lenses: Z97.3

## 2012-11-20 HISTORY — PX: CYSTOSCOPY WITH STENT PLACEMENT: SHX5790

## 2012-11-20 LAB — POCT I-STAT 4, (NA,K, GLUC, HGB,HCT)
Glucose, Bld: 103 mg/dL — ABNORMAL HIGH (ref 70–99)
HCT: 41 % (ref 36.0–46.0)
Hemoglobin: 13.9 g/dL (ref 12.0–15.0)
Potassium: 4.2 mEq/L (ref 3.5–5.1)

## 2012-11-20 SURGERY — CYSTOSCOPY, WITH CALCULUS MANIPULATION OR REMOVAL
Anesthesia: General | Site: Ureter | Laterality: Right | Wound class: Clean Contaminated

## 2012-11-20 MED ORDER — ONDANSETRON HCL 4 MG/2ML IJ SOLN
4.0000 mg | Freq: Four times a day (QID) | INTRAMUSCULAR | Status: DC | PRN
  Filled 2012-11-20: qty 2

## 2012-11-20 MED ORDER — SODIUM CHLORIDE 0.9 % IJ SOLN
3.0000 mL | INTRAMUSCULAR | Status: DC | PRN
  Filled 2012-11-20: qty 3

## 2012-11-20 MED ORDER — PROPOFOL 10 MG/ML IV BOLUS
INTRAVENOUS | Status: DC | PRN
  Administered 2012-11-20: 200 mg via INTRAVENOUS

## 2012-11-20 MED ORDER — OXYCODONE HCL 5 MG PO TABS
5.0000 mg | ORAL_TABLET | ORAL | Status: DC | PRN
  Filled 2012-11-20: qty 2

## 2012-11-20 MED ORDER — SODIUM CHLORIDE 0.9 % IJ SOLN
3.0000 mL | Freq: Two times a day (BID) | INTRAMUSCULAR | Status: DC
  Filled 2012-11-20: qty 3

## 2012-11-20 MED ORDER — CIPROFLOXACIN IN D5W 400 MG/200ML IV SOLN
400.0000 mg | INTRAVENOUS | Status: AC
  Administered 2012-11-20: 400 mg via INTRAVENOUS
  Filled 2012-11-20: qty 200

## 2012-11-20 MED ORDER — SODIUM CHLORIDE 0.9 % IR SOLN
Status: DC | PRN
  Administered 2012-11-20: 4000 mL

## 2012-11-20 MED ORDER — BELLADONNA ALKALOIDS-OPIUM 16.2-60 MG RE SUPP
RECTAL | Status: DC | PRN
  Administered 2012-11-20: 1 via RECTAL

## 2012-11-20 MED ORDER — SODIUM CHLORIDE 0.9 % IV SOLN
250.0000 mL | INTRAVENOUS | Status: DC | PRN
  Filled 2012-11-20: qty 250

## 2012-11-20 MED ORDER — DEXAMETHASONE SODIUM PHOSPHATE 4 MG/ML IJ SOLN
INTRAMUSCULAR | Status: DC | PRN
  Administered 2012-11-20: 10 mg via INTRAVENOUS

## 2012-11-20 MED ORDER — ACETAMINOPHEN 650 MG RE SUPP
650.0000 mg | RECTAL | Status: DC | PRN
  Filled 2012-11-20: qty 1

## 2012-11-20 MED ORDER — FENTANYL CITRATE 0.05 MG/ML IJ SOLN
INTRAMUSCULAR | Status: DC | PRN
  Administered 2012-11-20: 25 ug via INTRAVENOUS
  Administered 2012-11-20: 50 ug via INTRAVENOUS
  Administered 2012-11-20: 25 ug via INTRAVENOUS

## 2012-11-20 MED ORDER — LIDOCAINE HCL (CARDIAC) 20 MG/ML IV SOLN
INTRAVENOUS | Status: DC | PRN
  Administered 2012-11-20: 60 mg via INTRAVENOUS

## 2012-11-20 MED ORDER — ACETAMINOPHEN 325 MG PO TABS
650.0000 mg | ORAL_TABLET | ORAL | Status: DC | PRN
  Filled 2012-11-20: qty 2

## 2012-11-20 MED ORDER — MIDAZOLAM HCL 5 MG/5ML IJ SOLN
INTRAMUSCULAR | Status: DC | PRN
  Administered 2012-11-20: 2 mg via INTRAVENOUS

## 2012-11-20 MED ORDER — CIPROFLOXACIN IN D5W 400 MG/200ML IV SOLN
INTRAVENOUS | Status: AC
  Filled 2012-11-20: qty 200

## 2012-11-20 MED ORDER — LACTATED RINGERS IV SOLN
INTRAVENOUS | Status: DC
  Administered 2012-11-20 (×2): via INTRAVENOUS
  Filled 2012-11-20: qty 1000

## 2012-11-20 MED ORDER — OXYCODONE-ACETAMINOPHEN 5-325 MG PO TABS: 1.0000 | ORAL_TABLET | ORAL | Status: DC | PRN

## 2012-11-20 MED ORDER — PHENAZOPYRIDINE HCL 200 MG PO TABS: 200.0000 mg | ORAL_TABLET | Freq: Three times a day (TID) | ORAL | Status: DC | PRN

## 2012-11-20 MED ORDER — FENTANYL CITRATE 0.05 MG/ML IJ SOLN
25.0000 ug | INTRAMUSCULAR | Status: DC | PRN
  Filled 2012-11-20: qty 1

## 2012-11-20 MED ORDER — IOHEXOL 350 MG/ML SOLN
INTRAVENOUS | Status: DC | PRN
  Administered 2012-11-20: 10 mL

## 2012-11-20 MED ORDER — ONDANSETRON HCL 4 MG/2ML IJ SOLN
INTRAMUSCULAR | Status: DC | PRN
  Administered 2012-11-20: 4 mg via INTRAVENOUS

## 2012-11-20 MED ORDER — KETOROLAC TROMETHAMINE 30 MG/ML IJ SOLN
INTRAMUSCULAR | Status: DC | PRN
  Administered 2012-11-20: 30 mg via INTRAVENOUS

## 2012-11-20 SURGICAL SUPPLY — 39 items
BAG DRAIN URO-CYSTO SKYTR STRL (DRAIN) ×2 IMPLANT
BAG DRN UROCATH (DRAIN) ×1
BASKET LASER NITINOL 1.9FR (BASKET) IMPLANT
BASKET STNLS GEMINI 4WIRE 3FR (BASKET) IMPLANT
BASKET ZERO TIP NITINOL 2.4FR (BASKET) IMPLANT
BRUSH URET BIOPSY 3F (UROLOGICAL SUPPLIES) IMPLANT
BSKT STON RTRVL 120 1.9FR (BASKET)
BSKT STON RTRVL GEM 120X11 3FR (BASKET)
BSKT STON RTRVL ZERO TP 2.4FR (BASKET)
CANISTER SUCT LVC 12 LTR MEDI- (MISCELLANEOUS) ×1 IMPLANT
CATH URET 5FR 28IN CONE TIP (BALLOONS)
CATH URET 5FR 28IN OPEN ENDED (CATHETERS) ×1 IMPLANT
CATH URET 5FR 70CM CONE TIP (BALLOONS) IMPLANT
CLOTH BEACON ORANGE TIMEOUT ST (SAFETY) ×2 IMPLANT
DRAPE CAMERA CLOSED 9X96 (DRAPES) ×2 IMPLANT
ELECT REM PT RETURN 9FT ADLT (ELECTROSURGICAL)
ELECTRODE REM PT RTRN 9FT ADLT (ELECTROSURGICAL) IMPLANT
FIBER LASER FLEXIVA 1000 (UROLOGICAL SUPPLIES) IMPLANT
FIBER LASER FLEXIVA 200 (UROLOGICAL SUPPLIES) IMPLANT
FIBER LASER FLEXIVA 365 (UROLOGICAL SUPPLIES) ×1 IMPLANT
FIBER LASER FLEXIVA 550 (UROLOGICAL SUPPLIES) IMPLANT
GLOVE BIOGEL PI IND STRL 7.5 (GLOVE) IMPLANT
GLOVE BIOGEL PI INDICATOR 7.5 (GLOVE) ×5
GLOVE ECLIPSE 7.0 STRL STRAW (GLOVE) ×1 IMPLANT
GLOVE SURG SS PI 8.0 STRL IVOR (GLOVE) ×2 IMPLANT
GOWN STRL NON-REIN LRG LVL3 (GOWN DISPOSABLE) ×2 IMPLANT
GOWN STRL REIN XL XLG (GOWN DISPOSABLE) ×2 IMPLANT
GUIDEWIRE 0.038 PTFE COATED (WIRE) IMPLANT
GUIDEWIRE ANG ZIPWIRE 038X150 (WIRE) IMPLANT
GUIDEWIRE STR DUAL SENSOR (WIRE) ×3 IMPLANT
IV NS IRRIG 3000ML ARTHROMATIC (IV SOLUTION) ×4 IMPLANT
KIT BALLIN UROMAX 15FX10 (LABEL) IMPLANT
KIT BALLN UROMAX 15FX4 (MISCELLANEOUS) IMPLANT
KIT BALLN UROMAX 26 75X4 (MISCELLANEOUS)
PACK CYSTOSCOPY (CUSTOM PROCEDURE TRAY) ×2 IMPLANT
SET HIGH PRES BAL DIL (LABEL) ×1
SHEATH ACCESS URETERAL 38CM (SHEATH) IMPLANT
SHEATH ACCESS URETERAL 54CM (SHEATH) IMPLANT
STENT URET 6FRX24 CONTOUR (STENTS) ×1 IMPLANT

## 2012-11-20 NOTE — Anesthesia Postprocedure Evaluation (Signed)
  Anesthesia Post-op Note  Patient: Sarah Wong  Procedure(s) Performed: Procedure(s) (LRB): RIGHT URETEROSCOPY,BALLOON DILITATION, STONE EXTRACTION   (Right) HOLMIUM LASER APPLICATION (Right) CYSTOSCOPY WITH STENT PLACEMENT (Right)  Patient Location: PACU  Anesthesia Type: General  Level of Consciousness: awake and alert   Airway and Oxygen Therapy: Patient Spontanous Breathing  Post-op Pain: mild  Post-op Assessment: Post-op Vital signs reviewed, Patient's Cardiovascular Status Stable, Respiratory Function Stable, Patent Airway and No signs of Nausea or vomiting  Last Vitals:  Filed Vitals:   11/20/12 1250  BP: 143/72  Pulse: 65  Temp: 36.3 C  Resp: 10    Post-op Vital Signs: stable   Complications: No apparent anesthesia complications

## 2012-11-20 NOTE — Anesthesia Procedure Notes (Signed)
Procedure Name: LMA Insertion Date/Time: 11/20/2012 11:58 AM Performed by: Renella Cunas D Pre-anesthesia Checklist: Patient identified, Emergency Drugs available, Suction available and Patient being monitored Patient Re-evaluated:Patient Re-evaluated prior to inductionOxygen Delivery Method: Circle System Utilized Preoxygenation: Pre-oxygenation with 100% oxygen Intubation Type: IV induction Ventilation: Mask ventilation without difficulty LMA: LMA inserted LMA Size: 4.0 Number of attempts: 1 Airway Equipment and Method: bite block Placement Confirmation: positive ETCO2 Tube secured with: Tape Dental Injury: Teeth and Oropharynx as per pre-operative assessment

## 2012-11-20 NOTE — Interval H&P Note (Signed)
History and Physical Interval Note: She has residual symptomatic fragments in the right distal ureter.  11/20/2012 11:24 AM  Sarah Wong  has presented today for surgery, with the diagnosis of RIGHT DISTAL URETERAL STONE   The various methods of treatment have been discussed with the patient and family. After consideration of risks, benefits and other options for treatment, the patient has consented to  Procedure(s): RIGHT URETEROSCOPY, STONE EXTRACTION   (Right) POSSIBLE HOLMIUM LASER AND STENT PLACEMENT (N/A) as a surgical intervention .  The patient's history has been reviewed, patient examined, no change in status, stable for surgery.  I have reviewed the patient's chart and labs.  Questions were answered to the patient's satisfaction.     Maricella Filyaw J

## 2012-11-20 NOTE — Anesthesia Preprocedure Evaluation (Signed)
Anesthesia Evaluation  Patient identified by MRN, date of birth, ID band Patient awake    Reviewed: Allergy & Precautions, H&P , NPO status , Patient's Chart, lab work & pertinent test results  Airway Mallampati: II TM Distance: >3 FB Neck ROM: Full    Dental no notable dental hx.    Pulmonary sleep apnea ,  mild breath sounds clear to auscultation  Pulmonary exam normal       Cardiovascular hypertension, Pt. on medications and Pt. on home beta blockers Rhythm:Regular Rate:Normal     Neuro/Psych negative neurological ROS  negative psych ROS   GI/Hepatic Neg liver ROS, GERD-  Medicated,  Endo/Other  negative endocrine ROS  Renal/GU negative Renal ROS  negative genitourinary   Musculoskeletal negative musculoskeletal ROS (+)   Abdominal   Peds negative pediatric ROS (+)  Hematology negative hematology ROS (+)   Anesthesia Other Findings   Reproductive/Obstetrics negative OB ROS                           Anesthesia Physical Anesthesia Plan  ASA: II  Anesthesia Plan: General   Post-op Pain Management:    Induction: Intravenous  Airway Management Planned: LMA  Additional Equipment:   Intra-op Plan:   Post-operative Plan:   Informed Consent: I have reviewed the patients History and Physical, chart, labs and discussed the procedure including the risks, benefits and alternatives for the proposed anesthesia with the patient or authorized representative who has indicated his/her understanding and acceptance.   Dental advisory given  Plan Discussed with: CRNA and Surgeon  Anesthesia Plan Comments:         Anesthesia Quick Evaluation

## 2012-11-20 NOTE — H&P (View-Only) (Signed)
ctive Problems Problems  1. Abdominal Pain In The Right Upper Belly (RUQ) 789.01 2. Asymptomatic Hyperuricemia 790.6 3. Nephrolithiasis Of Both Kidneys 592.0 4. Stress Incontinence 788.39  History of Present Illness     Sarah Wong returns today in f/u.  She had the onset this morning of right lower quadrant pain.   She has had gross hematuria since.  The pain is moderate with some nausea.   She has no urgency or frequency.  She has TNTC RBC's.  KUB today shows a 6mm right proximal stone and a 3mm RLP stone.   Past Medical History Problems  1. History of  Adult Sleep Apnea 780.57 2. History of  Esophageal Reflux 530.81 3. History of  Murmurs 785.2 4. History of  Nephrolithiasis V13.01 5. History of  Prolapsing Mitral Valve Leaflet Syndrome 424.0 6. History of  Ureteral Stone Left 592.1  Surgical History Problems  1. History of  Cystoscopy With Insertion Of Ureteral Stent Left 2. History of  Hysterectomy V45.77 3. History of  Lithotripsy 4. History of  Rhinologic Surgery 5. History of  Uvulectomy 6. History of  Wrist Surgery Right  Current Meds 1. Allopurinol 100 MG Oral Tablet; 1QD - TAKE ONE TABLET BY MOUTH EVERY DAY; Therapy:  03Jun2014 to (Evaluate:29May2015)  Requested for: 03Jun2014; Last Rx:03Jun2014 2. Crestor 10 MG Oral Tablet; Therapy: (Recorded:03Oct2014) to 3. Diltiazem HCl TABS; Therapy: (Recorded:20May2013) to 4. Magnesium TABS; Therapy: (Recorded:02Jun2014) to 5. Metoprolol Tartrate TABS; Therapy: (Recorded:20May2013) to 6. Pantoprazole Sodium 40 MG Oral Tablet Delayed Release; Therapy: (Recorded:20May2013) to 7. Urocit-K 15 15 MEQ (1620 MG) Oral Tablet Extended Release; Take 1 tablet twice daily; Therapy:  26Feb2014 to (Evaluate:21Feb2015)  Requested for: 26Feb2014; Last Rx:26Feb2014  Allergies Medication  1. Penicillins 2. Contrast Media Ready-Box MISC 3. Vancomycin HCl Powder  Family History Problems  1. Paternal history of  Blood Disorders V18.3 2.  Family history of  Death In The Family Mother age 66 unknown 3. Maternal history of  Diabetes Mellitus V18.0 4. Family history of  Family Health Status Number Of Children 2 daughters 5. Family history of  Gout V18.19 6. Family history of  Heart Disease V17.49 7. Maternal history of  Hypertension V17.49 8. Paternal history of  Nephritis  Social History Problems  1. Alcohol Use 2 glasses per month 2. Caffeine Use coke and pepsi x 5 and tea. but she is trying to cut back. 3. Marital History - Currently Married 4. 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 History of  Tobacco Use 305.1      Past, family and social history reviewed and updated.   Review of Systems Genitourinary, constitutional, skin, eye, otolaryngeal, hematologic/lymphatic, cardiovascular, pulmonary, endocrine, musculoskeletal, gastrointestinal, neurological and psychiatric system(s) were reviewed and pertinent findings if present are noted.  Genitourinary: hematuria.  Gastrointestinal: nausea and flank pain.  Constitutional: no fever (but she felt flushed. ).  Cardiovascular: leg swelling.    Vitals Vital Signs [Data Includes: Last 1 Day]  20Oct2014 03:58PM  Blood Pressure: 124 / 75 Temperature: 97.2 F Heart Rate: 51  Physical Exam Constitutional: Well nourished and well developed . No acute distress.  Pulmonary: No respiratory distress and normal respiratory rhythm and effort.  Cardiovascular: Heart rate and rhythm are normal . No peripheral edema.  Abdomen: The abdomen is mildly obese. Moderate tenderness in the RLQ is present. moderate right CVA tenderness, but no left CVA tenderness.    Results/Data Urine [Data Includes: Last 1 Day]   20Oct2014  COLOR RED     APPEARANCE CLOUDY   SPECIFIC GRAVITY 1.020   pH 7.0   GLUCOSE NEG mg/dL  BILIRUBIN NEG   KETONE NEG mg/dL  BLOOD LARGE   PROTEIN NEG mg/dL  UROBILINOGEN 0.2 mg/dL  NITRITE NEG   LEUKOCYTE ESTERASE NEG   SQUAMOUS EPITHELIAL/HPF  RARE   WBC 0-2 WBC/hpf  RBC TNTC RBC/hpf  BACTERIA NONE SEEN   CRYSTALS NONE SEEN   CASTS NONE SEEN    The following images/tracing/specimen were independently visualized:  KUB today shows a 3-4mm RLP stone and a 5x6mm right proximal stone. I don't see any left renal stones. She has a stable right pelvic phlebolith but no other significant abnormalities.  The following clinical lab reports were reviewed:  UA reviewed.    Assessment Assessed  1. Ureteral Stone Right 592.1 2. Abdominal Pain In The Right Upper Belly (RUQ) 789.01 3. Nephrolithiasis Of Both Kidneys 592.0   Her RUP stone has moved into the proximal ureter and is causing pain and bleeding.   Plan Health Maintenance (V70.0)  1. UA With REFLEX  Done: 20Oct2014 03:39PM Nephrolithiasis Of Both Kidneys (592.0)  2. KUB  Done: 20Oct2014 12:00AM Ureteral Stone (592.1)  3. Ondansetron 4 MG Oral Tablet Dispersible; TAKE 1 TABLET Every 6 hours PRN nausea can take  1-2 tabs; Therapy: 20Oct2014 to (Last Rx:20Oct2014) 4. Oxycodone-Acetaminophen 5-325 MG Oral Tablet; take 1 or 2 tablets q 4-6 hours prn pain;  Therapy: 20Oct2014 to (Last Rx:20Oct2014) 5. Tamsulosin HCl 0.4 MG Oral Capsule; TAKE 1 CAPSULE EVERY DAY; Therapy: 20Oct2014 to  (Evaluate:19Dec2014); Last Rx:20Oct2014 6. Follow-up Schedule Surgery Office  Follow-up  Requested for: 20Oct2014   I discussed the options with her and will get her started on tamsulosin and a strainer.  I have given her scripts for percocet and zofran and will set her up for ESWL later this week or early next week.  Her prior stone was this size and wouldn't pass. I reviewed the risks of bleeding, infection, renal and adjacent organ injury, failure of adequate fragmentation with secondary procedures, thrombotic events and sedation risks.   

## 2012-11-20 NOTE — Brief Op Note (Signed)
11/20/2012  12:46 PM  PATIENT:  [name]  47 y.o. female  PRE-OPERATIVE DIAGNOSIS:  RIGHT DISTAL URETERAL STONE   POST-OPERATIVE DIAGNOSIS:  RIGHT DISTAL URETERAL STONE   PROCEDURE:  Procedure(s): RIGHT URETEROSCOPY,BALLOON DILITATION, STONE EXTRACTION   (Right) HOLMIUM LASER APPLICATION (Right) CYSTOSCOPY WITH STENT PLACEMENT (Right)  SURGEON:  Surgeon(s) and Role:    * Bjorn Pippin, MD - Primary  PHYSICIAN ASSISTANT:   ASSISTANTS: none   ANESTHESIA:   general  EBL:  Total I/O In: 300 [I.V.:300] Out: -   BLOOD ADMINISTERED:none  DRAINS: 6x24 right ureteral stent   LOCAL MEDICATIONS USED:  NONE  SPECIMEN:  Source of Specimen:  stone fragments  DISPOSITION OF SPECIMEN:  given to family  COUNTS:  YES  TOURNIQUET:  * No tourniquets in log *  DICTATION: .Other Dictation: Dictation Number [id number]  PLAN OF CARE: Discharge to home after PACU  PATIENT DISPOSITION:  PACU - hemodynamically stable.   Delay start of Pharmacological VTE agent (>24hrs) due to surgical blood loss or risk of bleeding: not applicable

## 2012-11-20 NOTE — Transfer of Care (Signed)
Immediate Anesthesia Transfer of Care Note  Patient: Sarah Wong  Procedure(s) Performed: Procedure(s) (LRB): RIGHT URETEROSCOPY,BALLOON DILITATION, STONE EXTRACTION   (Right) HOLMIUM LASER APPLICATION (Right) CYSTOSCOPY WITH STENT PLACEMENT (Right)  Patient Location: PACU  Anesthesia Type: General  Level of Consciousness: awake, sedated, patient cooperative and responds to stimulation  Airway & Oxygen Therapy: Patient Spontanous Breathing and Patient connected to face mask oxygen  Post-op Assessment: Report given to PACU RN, Post -op Vital signs reviewed and stable and Patient moving all extremities  Post vital signs: Reviewed and stable  Complications: No apparent anesthesia complications

## 2012-11-21 NOTE — Op Note (Signed)
NAMEKERISSA, COIA              ACCOUNT NO.:  [id number]  MEDICAL RECORD NO.:  [id number]  LOCATION:                                 FACILITY:  PHYSICIAN:  Excell Seltzer. Annabell Howells, M.D.    DATE OF BIRTH:  10-30-1965  DATE OF PROCEDURE:  11/20/2012 DATE OF DISCHARGE:                              OPERATIVE REPORT   PROCEDURE:  Cystoscopy, right retrograde pyelogram with interpretation, right ureteroscopic stone extraction with holmium lasertripsy, placement of right double-J stent.  PREOPERATIVE DIAGNOSIS:  Right distal ureteral stone.  POSTOPERATIVE DIAGNOSIS:  Right distal ureteral stone.  SURGEON:  Excell Seltzer. Annabell Howells, M.D.  ANESTHESIA:  General.  SPECIMENS:  Stone fragments.  DRAINS:  A 6-French 24 cm double-J stent.  COMPLICATIONS:  None.  INDICATIONS:  Sarah Wong is a 47 year old white female with history of stones who recently underwent lithotripsy of a proximal 8-9 mm right mid ureteral stone.  Postoperatively, she has continued to have pain and fluoroscopy revealed only minimal fragmentation of the stone with large fragment just at the lower edge of the sacrum.  It was felt that ureteroscopy was indicated for further therapy.  FINDINGS OF PROCEDURE:  She was given Cipro and taken to the operating room where general anesthetic was induced.  She was placed in lithotomy position.  Her perineum and genitalia were prepped with Betadine solution.  She was draped in usual sterile fashion.  Cystoscopy was performed using a 22-French scope and 12-degree lens. Examination revealed a normal urethra.  The bladder wall was smooth and pale without tumor, stones, or inflammation.  Ureteral orifices were unremarkable.  The right ureteral orifice was cannulated with 5-French open-end catheter and contrast was instilled.  That demonstrated a delicate right distal ureter with significant tightness just below filling defect consistent with a stone and more proximal dilation.  After completion  of retrograde pyelogram, a guidewire was passed through the open-end catheter and while it was tight I was able to negotiate by the stone into the kidney.  At this point, the ureteroscope was advanced alongside the wire.  I was able to get it about 3 cm up the ureter and then encountered a narrow section that would not admit the scope.  I then passed a 10 cm 15-French high-pressure balloon catheter over the wire up to the level of stone and dilated the ureter to 16 atmospheres. There were no residual or narrowed areas during dilation.  The balloon was then removed and the ureteroscope was advanced alongside the wire.  There was some mucosal splitting from the balloon as expected.  The stone was eventually encountered, it was behind a little bit of a flap, slight ledge in the ureter, but it could be visualized.  At this point, a 365 micron holmium laser fiber was inserted, the laser was set on 1 joule and 5 Hz and the stone was engaged.  The stone fragmented, but was fairly durable, but eventually relented to the laser and broke into small fragments.  Some additional lasing was performed at 0.5 joules and 10 Hz.  Once the stone had been adequately fragmented, I was able to advance the scope by the stone fragments into the proximal ureter  where no residual stones were noted.  During the ureteroscopic manipulation, the stone fragments flushed into the bladder and basket retrieval was not required.  Final inspection revealed no residual ureteral stones.  There were some blanching of the mucosa at the area of the stone impaction along with a split mucosa more distally and it was felt that stenting was indicated for more prolonged period of time, so the bladder was then drained and the stone fragments were evacuated.  The cystoscope was inserted over the wire and a 6-French 24 cm double-J stent without string was then inserted to the kidney under fluoroscopic guidance.  The wire  was removed leaving a good coil in the kidney, a good coil in the bladder. The bladder was then drained.  The patient was taken down from lithotomy position.  Her anesthetic was reversed.  She was moved to recovery room in stable condition.  There were no complications.     Excell Seltzer. Annabell Howells, M.D.     JJW/MEDQ  D:  11/20/2012  T:  11/21/2012  Job:  [id number]

## 2012-11-24 ENCOUNTER — Encounter (HOSPITAL_BASED_OUTPATIENT_CLINIC_OR_DEPARTMENT_OTHER): Payer: Self-pay | Admitting: Urology

## 2013-01-02 ENCOUNTER — Ambulatory Visit: Payer: BC Managed Care – PPO | Admitting: Cardiology

## 2013-02-06 ENCOUNTER — Ambulatory Visit (INDEPENDENT_AMBULATORY_CARE_PROVIDER_SITE_OTHER): Payer: BC Managed Care – PPO | Admitting: Cardiology

## 2013-02-06 ENCOUNTER — Encounter: Payer: Self-pay | Admitting: Cardiology

## 2013-02-06 ENCOUNTER — Other Ambulatory Visit: Payer: Self-pay | Admitting: *Deleted

## 2013-02-06 VITALS — BP 120/78 | HR 55 | Ht 60.0 in | Wt 174.0 lb

## 2013-02-06 DIAGNOSIS — I059 Rheumatic mitral valve disease, unspecified: Secondary | ICD-10-CM

## 2013-02-06 MED ORDER — ROSUVASTATIN CALCIUM 10 MG PO TABS: 10.0000 mg | ORAL_TABLET | Freq: Every morning | ORAL | Status: DC

## 2013-02-06 MED ORDER — DILTIAZEM HCL ER 180 MG PO CP24: 180.0000 mg | ORAL_CAPSULE | Freq: Every morning | ORAL | Status: DC

## 2013-02-06 MED ORDER — PANTOPRAZOLE SODIUM 40 MG PO TBEC: 40.0000 mg | DELAYED_RELEASE_TABLET | Freq: Two times a day (BID) | ORAL | Status: DC

## 2013-02-06 MED ORDER — METOPROLOL SUCCINATE ER 25 MG PO TB24: 25.0000 mg | ORAL_TABLET | Freq: Two times a day (BID) | ORAL | Status: DC

## 2013-02-06 MED ORDER — TRIAMTERENE-HCTZ 37.5-25 MG PO CAPS: ORAL_CAPSULE | ORAL | Status: DC

## 2013-02-06 NOTE — Patient Instructions (Signed)
Your physician recommends that you continue on your current medications as directed. Please refer to the Current Medication list given to you today.  Your physician wants you to follow-up in: 1 year. You will receive a reminder letter in the mail two months in advance. If you don't receive a letter, please call our office to schedule the follow-up appointment.  

## 2013-02-06 NOTE — Progress Notes (Signed)
HPI The patient presents for evaluation having previously been seen by Dr. Rollene Fare.  He followed her for many years her mitral valve prolapse. I did review an echo from 2012 and there was no evidence of prolapse no regurgitation. He has also followed her because of a family history of early onset heart disease. I reviewed a CT coronary angiogram from 2008 which demonstrated normal coronaries and no evidence of calcium. Her mother also apparently had aortic root enlargement and she describes a root replacement and bypass. There is no evidence on previous studies of this patient having any similar root enlargement. She denies any ongoing cardiovascular symptoms. She denies any chest pressure, neck or arm discomfort. She has occasional palpitations but no sustained arrhythmias. She has no PND or orthopnea. She has gained weight and hasn't been exercising unfortunately. She does have reflux.  Allergies  Allergen Reactions  . Iohexol Hives     Code: HIVES, Desc: HIVES WITH IV CONTRAST MEDIA- PT REQUIRES 13 HR PRE-MEDS, Onset Date: [id number]   . Penicillins Hives  . Vancomycin Swelling    Current Outpatient Prescriptions  Medication Sig Dispense Refill  . allopurinol (ZYLOPRIM) 100 MG tablet Take 100 mg by mouth daily.      Marland Kitchen diltiazem (DILACOR XR) 180 MG 24 hr capsule Take 180 mg by mouth every morning.       . Magnesium 500 MG TABS Take 500 mg by mouth daily.      . metoprolol succinate (TOPROL-XL) 25 MG 24 hr tablet Take 25 mg by mouth 2 (two) times daily.      . pantoprazole (PROTONIX) 40 MG tablet Take 40 mg by mouth 2 (two) times daily.       . Potassium Citrate (UROCIT-K 15 PO) Take 15 mEq by mouth 2 (two) times daily.       . rosuvastatin (CRESTOR) 10 MG tablet Take 10 mg by mouth every morning.       . triamterene-hydrochlorothiazide (DYAZIDE) 37.5-25 MG per capsule Take 1 capsule by mouth every morning.       No current facility-administered medications for this visit.    Past  Medical History  Diagnosis Date  . GERD (gastroesophageal reflux disease)   . Hypertension   . Splenic flexure syndrome   . Right ureteral stone   . History of kidney stones   . Hyperlipidemia   . Mild obstructive sleep apnea     PER STUDY 07-07-2010--  MILD OSA/  NO CPAP RX    . Renal calculus, bilateral     NON-OBSTRUTIVE  . Wears glasses     Past Surgical History  Procedure Laterality Date  . Nasal septum surgery  DEC 2013  . Wrist ganglion excision Right 11-14-2001  . Cystoscopy w/ ureteral stent placement  05/21/2011    Procedure: CYSTOSCOPY WITH RETROGRADE PYELOGRAM/URETERAL STENT PLACEMENT;  Surgeon: Malka So, MD;  Location: [facility];  Service: Urology;  Laterality: Left;  . Extracorporeal shock wave lithotripsy  LEFT 05-31-2011/   RIGHT 10-30-2012  . Right wrist exploration/ capsulotomy/ tenolysis  03-30-2002  . Transthoracic echocardiogram  03-27-2012    NORMAL LVF/  EF 55-60%/  NO EVIDENCE MVP  . Cardiovascular stress test  11-08-2009  [facility]    NORMAL PERFUSION STUDY/ NO ISCHEMIA/ EF 63%  . Vaginal hysterectomy  1999  . Cystoscopy with ureteroscopy, stone basketry and stent placement Right 11/20/2012    Procedure: RIGHT URETEROSCOPY,BALLOON DILITATION, STONE EXTRACTION  ;  Surgeon: Irine Seal, MD;  Location: [facility]  ;  Service: Urology;  Laterality: Right;  . Holmium laser application Right 61/60/7371    Procedure: HOLMIUM LASER APPLICATION;  Surgeon: Irine Seal, MD;  Location: [facility];  Service: Urology;  Laterality: Right;  . Cystoscopy with stent placement Right 11/20/2012    Procedure: CYSTOSCOPY WITH STENT PLACEMENT;  Surgeon: Irine Seal, MD;  Location: [facility];  Service: Urology;  Laterality: Right;    Family History  Problem Relation Age of Onset  . Heart disease      grandmother  . Hypertension Mother   . Hypertension Father   . Diabetes Mother   . COPD Mother   . Heart failure Father      open heart surgery  . Stroke Maternal Grandmother   . Heart failure Mother     open heart surgery    History   Social History  . Marital Status: Married    Spouse Name: N/A    Number of Children: 2  . Years of Education: N/A   Occupational History  . Not on file.   Social History Main Topics  . Smoking status: Never Smoker   . Smokeless tobacco: Never Used  . Alcohol Use: No  . Drug Use: No  . Sexual Activity: Not on file   Other Topics Concern  . Not on file   Social History Narrative  . No narrative on file    ROS:  Positive for headaches, reflux, ankle edema. Otherwise as stated in the history of present illness and negative for all other systems.   PHYSICAL EXAM BP 120/78  Pulse 55  Ht 5' (1.524 m)  Wt 174 lb (78.926 kg)  BMI 33.98 kg/m2 GENERAL:  Well appearing HEENT:  Pupils equal round and reactive, fundi not visualized, oral mucosa unremarkable NECK:  No jugular venous distention, waveform within normal limits, carotid upstroke brisk and symmetric, no bruits, no thyromegaly LYMPHATICS:  No cervical, inguinal adenopathy LUNGS:  Clear to auscultation bilaterally BACK:  No CVA tenderness CHEST:  Unremarkable HEART:  PMI not displaced or sustained,S1 and S2 within normal limits, no S3, no S4, no clicks, no rubs, no murmurs ABD:  Flat, positive bowel sounds normal in frequency in pitch, no bruits, no rebound, no guarding, no midline pulsatile mass, no hepatomegaly, no splenomegaly EXT:  2 plus pulses throughout, no edema, no cyanosis no clubbing SKIN:  No rashes no nodules NEURO:  Cranial nerves II through XII grossly intact, motor grossly intact throughout PSYCH:  Cognitively intact, oriented to person place and time  EKG:  Sinus rhythm, rate 55, axis within normal limits, intervals within normal limits, no acute ST-T wave changes.  ASSESSMENT AND PLAN  MVP: I reviewed the previous echo and there was no evidence of mitral valve prolapse with  regurgitation. I don't hear any evidence of this on physical exam. No further evaluation is indicated.  OBESITY:   We had a long discussion about this. She was given specific recommendations about weight loss with diet and exercise.  FAMILY HISTORY OF CAD: The patient has no objective evidence for any coronary disease and has had negative workups recently. I would have a low threshold for screening exercise treadmill test in the future. However, this time no further testing is indicated. She will continue with primary risk reduction.

## 2013-04-28 ENCOUNTER — Telehealth: Payer: Self-pay | Admitting: Family Medicine

## 2013-04-28 NOTE — Telephone Encounter (Signed)
Pt would like md to accept her hus alliana mcauliff as new pt. Pt hus saw md at [place]. Pt needs new pt appt wk of 5-11 thru 05-14-13. Can I sch?

## 2013-04-29 NOTE — Telephone Encounter (Signed)
lmom for pt to call back

## 2013-04-29 NOTE — Telephone Encounter (Signed)
yes

## 2013-04-30 NOTE — Telephone Encounter (Signed)
Pt hus has been sch °

## 2013-09-28 ENCOUNTER — Encounter: Payer: Self-pay | Admitting: Family Medicine

## 2013-09-28 ENCOUNTER — Ambulatory Visit (INDEPENDENT_AMBULATORY_CARE_PROVIDER_SITE_OTHER): Payer: BC Managed Care – PPO | Admitting: Family Medicine

## 2013-09-28 VITALS — BP 128/68 | HR 65 | Temp 98.0°F | Wt 174.0 lb

## 2013-09-28 DIAGNOSIS — M65979 Unspecified synovitis and tenosynovitis, unspecified ankle and foot: Secondary | ICD-10-CM

## 2013-09-28 DIAGNOSIS — M7752 Other enthesopathy of left foot: Secondary | ICD-10-CM

## 2013-09-28 DIAGNOSIS — M659 Synovitis and tenosynovitis, unspecified: Secondary | ICD-10-CM

## 2013-09-28 DIAGNOSIS — J019 Acute sinusitis, unspecified: Secondary | ICD-10-CM

## 2013-09-28 MED ORDER — AZITHROMYCIN 250 MG PO TABS: ORAL_TABLET | ORAL | Status: AC

## 2013-09-28 NOTE — Progress Notes (Signed)
Subjective:    Patient ID: Sarah Wong, female    DOB: 29-Jun-1965, 48 y.o.   MRN: [id number]  Sinusitis Associated symptoms include congestion and headaches. Pertinent negatives include no chills, coughing or shortness of breath.   Patient seen for the following 2 acute new issues  Dizziness over the past few days. Onset Saturday of vertigo-type symptoms when she got up. She's had over one week history of sinus congestion and bilateral maxillary facial pain. Some postnasal drainage. Increased malaise. Intermittent headaches. No fevers or chills. She's tried over-the-counter medications without much relief.  Second new problem is left ankle pain. No injury. She has some pain and mild swelling just posterior and inferior to lateral malleolus. No bony tenderness. No instability. Symptoms present for a few weeks. Sometimes has pain with ambulation.  Past Medical History  Diagnosis Date  . GERD (gastroesophageal reflux disease)   . Splenic flexure syndrome   . Right ureteral stone   . History of kidney stones   . Hyperlipidemia   . Mild obstructive sleep apnea     PER STUDY 07-07-2010--  MILD OSA/  NO CPAP RX    . Renal calculus, bilateral     NON-OBSTRUTIVE  . Wears glasses   . HTN (hypertension)    Past Surgical History  Procedure Laterality Date  . Nasal septum surgery  DEC 2013  . Wrist ganglion excision Right 11-14-2001  . Cystoscopy w/ ureteral stent placement  05/21/2011    Procedure: CYSTOSCOPY WITH RETROGRADE PYELOGRAM/URETERAL STENT PLACEMENT;  Surgeon: Malka So, MD;  Location: [facility];  Service: Urology;  Laterality: Left;  . Extracorporeal shock wave lithotripsy  LEFT 05-31-2011/   RIGHT 10-30-2012  . Right wrist exploration/ capsulotomy/ tenolysis  03-30-2002  . Transthoracic echocardiogram  03-27-2012    NORMAL LVF/  EF 55-60%/  NO EVIDENCE MVP  . Cardiovascular stress test  11-08-2009  [facility]    NORMAL PERFUSION STUDY/ NO ISCHEMIA/ EF 63%  . Vaginal  hysterectomy  1999  . Cystoscopy with ureteroscopy, stone basketry and stent placement Right 11/20/2012    Procedure: RIGHT URETEROSCOPY,BALLOON DILITATION, STONE EXTRACTION  ;  Surgeon: Irine Seal, MD;  Location: [facility];  Service: Urology;  Laterality: Right;  . Holmium laser application Right 80/03/4915    Procedure: HOLMIUM LASER APPLICATION;  Surgeon: Irine Seal, MD;  Location: [facility];  Service: Urology;  Laterality: Right;  . Cystoscopy with stent placement Right 11/20/2012    Procedure: CYSTOSCOPY WITH STENT PLACEMENT;  Surgeon: Irine Seal, MD;  Location: [facility];  Service: Urology;  Laterality: Right;    reports that she has never smoked. She has never used smokeless tobacco. She reports that she does not drink alcohol or use illicit drugs. family history includes COPD in her mother; Diabetes in her mother; Heart disease (age of onset: 34) in her mother; Heart failure in her father; Hypertension in her father and mother; Stroke in her maternal grandmother. Allergies  Allergen Reactions  . Iohexol Hives     Code: HIVES, Desc: HIVES WITH IV CONTRAST MEDIA- PT REQUIRES 13 HR PRE-MEDS, Onset Date: [id number]   . Penicillins Hives  . Vancomycin Swelling      Review of Systems  Constitutional: Positive for fatigue. Negative for fever and chills.  HENT: Positive for congestion.   Respiratory: Negative for cough and shortness of breath.   Cardiovascular: Negative for chest pain.  Gastrointestinal: Negative for abdominal pain.  Neurological: Positive for dizziness and  headaches. Negative for syncope.  Hematological: Negative for adenopathy.       Objective:   Physical Exam  Constitutional: She is oriented to person, place, and time. She appears well-developed and well-nourished.  HENT:  Right Ear: External ear normal.  Left Ear: External ear normal.  Mouth/Throat: Oropharynx is clear and moist.  Neck: Neck supple.    Cardiovascular: Normal rate and regular rhythm.   Pulmonary/Chest: Effort normal and breath sounds normal. No respiratory distress. She has no wheezes. She has no rales.  Musculoskeletal: She exhibits no edema.  Left ankle reveals full range of motion. No bony tenderness. She has some mild tenderness just posterior to the lateral malleolus. Achilles is nontender and fully intact. Full range of motion with plantar flexion dorsiflexion.  Lymphadenopathy:    She has no cervical adenopathy.  Neurological: She is alert and oriented to person, place, and time. No cranial nerve deficit.  Cerebellar normal by finger to nose testing          Assessment & Plan:  #1 acute sinusitis. Question viral versus bacterial. We've recommended observation. If symptoms not improving over the next few days, start Zithromax  #2 left ankle pain. Suspect tendinitis. We've recommended icing couple times daily. Compression wrap given for symptom relief. Touch base of not improving in the next couple weeks

## 2013-09-28 NOTE — Patient Instructions (Signed)

## 2013-09-28 NOTE — Progress Notes (Signed)
Pre visit review using our clinic review tool, if applicable. No additional management support is needed unless otherwise documented below in the visit note. 

## 2013-09-30 ENCOUNTER — Telehealth: Payer: Self-pay | Admitting: Family Medicine

## 2013-09-30 NOTE — Telephone Encounter (Signed)
Pt said she was in on  Monday and was diagn with a sinus infection. She said she is still dizzy and having headaches in the back of her head. She called ask if this is something that will take a while to heal or do she need to come in for another visit.

## 2013-09-30 NOTE — Telephone Encounter (Signed)
Pt wants to know if she can get a note for work for the rest of the week. And return to work on Monday. Pt is still dizzy.

## 2013-09-30 NOTE — Telephone Encounter (Signed)
yes

## 2013-09-30 NOTE — Telephone Encounter (Signed)
We expect this to take a while.  Finish the antibiotic and OTC meds as needed for headache.

## 2013-09-30 NOTE — Telephone Encounter (Signed)
Pt is aware that letter is ready for pickup 

## 2014-03-04 ENCOUNTER — Other Ambulatory Visit: Payer: Self-pay

## 2014-03-04 ENCOUNTER — Ambulatory Visit: Payer: BLUE CROSS/BLUE SHIELD | Admitting: Family Medicine

## 2014-03-04 MED ORDER — TRIAMTERENE-HCTZ 37.5-25 MG PO CAPS: ORAL_CAPSULE | ORAL | Status: DC

## 2014-07-14 ENCOUNTER — Encounter: Payer: Self-pay | Admitting: Family Medicine

## 2014-07-14 ENCOUNTER — Ambulatory Visit (INDEPENDENT_AMBULATORY_CARE_PROVIDER_SITE_OTHER): Payer: Managed Care, Other (non HMO) | Admitting: Family Medicine

## 2014-07-14 VITALS — BP 120/80 | HR 84 | Temp 97.9°F | Wt 165.0 lb

## 2014-07-14 DIAGNOSIS — F4323 Adjustment disorder with mixed anxiety and depressed mood: Secondary | ICD-10-CM | POA: Diagnosis not present

## 2014-07-14 MED ORDER — SERTRALINE HCL 50 MG PO TABS: 50.0000 mg | ORAL_TABLET | Freq: Every day | ORAL | Status: DC

## 2014-07-14 NOTE — Progress Notes (Signed)
Subjective:    Patient ID: Sarah Wong, female    DOB: 01-Dec-1965, 49 y.o.   MRN: [id number]  HPI Patient seen with stress anxiety and depression issues. Her dad just passed away about a month ago after battling with lung cancer. She's having difficulty still with that. Then, just recently her youngest daughter basically ran away from home to live with someone they do not approve of. This caused great stress for her. She's having frequent crying spells. Fatigue. Sleep disturbance. No appetite or weight changes. Denies prior history of depression. She's not had any counseling. Denies any active suicidal ideation.  Past Medical History  Diagnosis Date  . GERD (gastroesophageal reflux disease)   . Splenic flexure syndrome   . Right ureteral stone   . History of kidney stones   . Hyperlipidemia   . Mild obstructive sleep apnea     PER STUDY 07-07-2010--  MILD OSA/  NO CPAP RX    . Renal calculus, bilateral     NON-OBSTRUTIVE  . Wears glasses   . HTN (hypertension)    Past Surgical History  Procedure Laterality Date  . Nasal septum surgery  DEC 2013  . Wrist ganglion excision Right 11-14-2001  . Cystoscopy w/ ureteral stent placement  05/21/2011    Procedure: CYSTOSCOPY WITH RETROGRADE PYELOGRAM/URETERAL STENT PLACEMENT;  Surgeon: Malka So, MD;  Location: [facility];  Service: Urology;  Laterality: Left;  . Extracorporeal shock wave lithotripsy  LEFT 05-31-2011/   RIGHT 10-30-2012  . Right wrist exploration/ capsulotomy/ tenolysis  03-30-2002  . Transthoracic echocardiogram  03-27-2012    NORMAL LVF/  EF 55-60%/  NO EVIDENCE MVP  . Cardiovascular stress test  11-08-2009  [facility]    NORMAL PERFUSION STUDY/ NO ISCHEMIA/ EF 63%  . Vaginal hysterectomy  1999  . Cystoscopy with ureteroscopy, stone basketry and stent placement Right 11/20/2012    Procedure: RIGHT URETEROSCOPY,BALLOON DILITATION, STONE EXTRACTION  ;  Surgeon: Irine Seal, MD;  Location: [facility];   Service: Urology;  Laterality: Right;  . Holmium laser application Right 91/63/8466    Procedure: HOLMIUM LASER APPLICATION;  Surgeon: Irine Seal, MD;  Location: [facility];  Service: Urology;  Laterality: Right;  . Cystoscopy with stent placement Right 11/20/2012    Procedure: CYSTOSCOPY WITH STENT PLACEMENT;  Surgeon: Irine Seal, MD;  Location: [facility];  Service: Urology;  Laterality: Right;    reports that she has never smoked. She has never used smokeless tobacco. She reports that she does not drink alcohol or use illicit drugs. family history includes COPD in her mother; Diabetes in her mother; Heart disease (age of onset: 2) in her mother; Heart failure in her father; Hypertension in her father and mother; Stroke in her maternal grandmother. Allergies  Allergen Reactions  . Iohexol Hives     Code: HIVES, Desc: HIVES WITH IV CONTRAST MEDIA- PT REQUIRES 13 HR PRE-MEDS, Onset Date: [id number]   . Penicillins Hives  . Vancomycin Swelling      Review of Systems  Constitutional: Positive for fatigue. Negative for appetite change and unexpected weight change.  Respiratory: Negative for shortness of breath.   Cardiovascular: Negative for chest pain.  Neurological: Negative for dizziness and headaches.  Psychiatric/Behavioral: Positive for sleep disturbance and dysphoric mood. Negative for suicidal ideas and agitation. The patient is nervous/anxious.        Objective:   Physical Exam  Constitutional: She is oriented to person, place, and time. She appears  well-developed and well-nourished. No distress.  Cardiovascular: Normal rate and regular rhythm.   Pulmonary/Chest: Effort normal and breath sounds normal. No respiratory distress. She has no wheezes. She has no rales.  Neurological: She is alert and oriented to person, place, and time. No cranial nerve deficit.          Assessment & Plan:  Mixed anxiety and depression. PH Q-9 score of 9. We  discussed possible counseling and she is not interested at this point. We agreed to starting sertraline 50 mg daily at bedtime. Reassess 3 weeks. Strongly advise counseling at that point if no improvement

## 2014-07-14 NOTE — Progress Notes (Signed)
Pre visit review using our clinic review tool, if applicable. No additional management support is needed unless otherwise documented below in the visit note. 

## 2014-08-04 ENCOUNTER — Ambulatory Visit: Payer: Managed Care, Other (non HMO) | Admitting: Family Medicine

## 2014-08-12 ENCOUNTER — Ambulatory Visit: Payer: BLUE CROSS/BLUE SHIELD | Admitting: Cardiology

## 2014-08-26 ENCOUNTER — Ambulatory Visit: Payer: Managed Care, Other (non HMO) | Admitting: Cardiology

## 2014-09-08 ENCOUNTER — Ambulatory Visit: Payer: Managed Care, Other (non HMO) | Admitting: Cardiology

## 2014-09-15 ENCOUNTER — Encounter: Payer: Self-pay | Admitting: Cardiology

## 2014-12-01 ENCOUNTER — Ambulatory Visit (INDEPENDENT_AMBULATORY_CARE_PROVIDER_SITE_OTHER): Payer: 59 | Admitting: Cardiology

## 2014-12-01 ENCOUNTER — Encounter: Payer: Self-pay | Admitting: Cardiology

## 2014-12-01 VITALS — BP 122/80 | HR 66 | Ht 61.0 in | Wt 176.0 lb

## 2014-12-01 DIAGNOSIS — I341 Nonrheumatic mitral (valve) prolapse: Secondary | ICD-10-CM

## 2014-12-01 DIAGNOSIS — R0600 Dyspnea, unspecified: Secondary | ICD-10-CM | POA: Diagnosis not present

## 2014-12-01 MED ORDER — FUROSEMIDE 20 MG PO TABS: 20.0000 mg | ORAL_TABLET | Freq: Every day | ORAL | Status: DC | PRN

## 2014-12-01 NOTE — Progress Notes (Signed)
HPI The patient presents for evaluation having previously been seen by Dr. Rollene Fare.  He followed her for many years her mitral valve prolapse. I did review an echo from 2014 and there was no evidence of prolapse and no regurgitation. He has also followed her because of a family history of early onset heart disease. I reviewed a CT coronary angiogram from 2008 which demonstrated normal coronaries and no evidence of calcium. Her mother also apparently had aortic root enlargement and she describes a root replacement and bypass.   It has been about 18 months since I last saw the patient. She's had a difficult time with her dad getting lung cancer and dying. She's had some difficulties with a college-age daughter as well. She does get some pain in her left axilla and shooting across her chest sporadically. She has some shortness of breath sometimes waking her at night. She has had some anxiety and depression that is currently being treated. She's not been as active because of all that has been going on. She's not describing any palpitations, presyncope or syncope. She's not describing any orthopnea.  She has had some increasing hand and leg swelling mildly.  Allergies  Allergen Reactions  . Iohexol Hives     Code: HIVES, Desc: HIVES WITH IV CONTRAST MEDIA- PT REQUIRES 13 HR PRE-MEDS, Onset Date: [id number]   . Penicillins Hives  . Vancomycin Swelling    Current Outpatient Prescriptions  Medication Sig Dispense Refill  . allopurinol (ZYLOPRIM) 100 MG tablet Take 100 mg by mouth daily.    Marland Kitchen diltiazem (DILACOR XR) 180 MG 24 hr capsule Take 1 capsule (180 mg total) by mouth every morning. 90 capsule 3  . Magnesium 500 MG TABS Take 500 mg by mouth daily.    . metoprolol succinate (TOPROL-XL) 25 MG 24 hr tablet Take 1 tablet (25 mg total) by mouth 2 (two) times daily. 180 tablet 3  . pantoprazole (PROTONIX) 40 MG tablet Take 1 tablet (40 mg total) by mouth 2 (two) times daily. 180 tablet 3  . Potassium  Citrate (UROCIT-K 15 PO) Take 15 mEq by mouth 2 (two) times daily.     . rosuvastatin (CRESTOR) 10 MG tablet Take 1 tablet (10 mg total) by mouth every morning. 90 tablet 3  . sertraline (ZOLOFT) 50 MG tablet Take 1 tablet (50 mg total) by mouth daily. 30 tablet 3  . triamterene-hydrochlorothiazide (DYAZIDE) 37.5-25 MG capsule Take 1 capsule by mouth every other day.    . triamterene-hydrochlorothiazide (DYAZIDE) 37.5-25 MG per capsule 1 tablet every other day (Patient taking differently: Take 1 capsule by mouth. 1 tablet by mouth every other day) 45 capsule 0   No current facility-administered medications for this visit.    Past Medical History  Diagnosis Date  . GERD (gastroesophageal reflux disease)   . Splenic flexure syndrome   . Right ureteral stone   . History of kidney stones   . Hyperlipidemia   . Mild obstructive sleep apnea     PER STUDY 07-07-2010--  MILD OSA/  NO CPAP RX    . Renal calculus, bilateral     NON-OBSTRUTIVE  . Wears glasses   . HTN (hypertension)     Past Surgical History  Procedure Laterality Date  . Nasal septum surgery  DEC 2013  . Wrist ganglion excision Right 11-14-2001  . Cystoscopy w/ ureteral stent placement  05/21/2011    Procedure: CYSTOSCOPY WITH RETROGRADE PYELOGRAM/URETERAL STENT PLACEMENT;  Surgeon: Malka So, MD;  Location: [facility];  Service: Urology;  Laterality: Left;  . Extracorporeal shock wave lithotripsy  LEFT 05-31-2011/   RIGHT 10-30-2012  . Right wrist exploration/ capsulotomy/ tenolysis  03-30-2002  . Transthoracic echocardiogram  03-27-2012    NORMAL LVF/  EF 55-60%/  NO EVIDENCE MVP  . Cardiovascular stress test  11-08-2009  [facility]    NORMAL PERFUSION STUDY/ NO ISCHEMIA/ EF 63%  . Vaginal hysterectomy  1999  . Cystoscopy with ureteroscopy, stone basketry and stent placement Right 11/20/2012    Procedure: RIGHT URETEROSCOPY,BALLOON DILITATION, STONE EXTRACTION  ;  Surgeon: Irine Seal, MD;  Location: [facility];  Service: Urology;  Laterality: Right;  . Holmium laser application Right [id number]    Procedure: HOLMIUM LASER APPLICATION;  Surgeon: Irine Seal, MD;  Location: [facility];  Service: Urology;  Laterality: Right;  . Cystoscopy with stent placement Right 11/20/2012    Procedure: CYSTOSCOPY WITH STENT PLACEMENT;  Surgeon: Irine Seal, MD;  Location: [facility];  Service: Urology;  Laterality: Right;     ROS:  Positive for headaches, reflux, ankle edema. Otherwise as stated in the history of present illness and negative for all other systems.   PHYSICAL EXAM BP 122/80 mmHg  Pulse 66  Ht 5\' 1"  (1.549 m)  Wt 176 lb (79.833 kg)  BMI 33.27 kg/m2 GENERAL:  Well appearing HEENT:  Pupils equal round and reactive, fundi not visualized, oral mucosa unremarkable NECK:  No jugular venous distention, waveform within normal limits, carotid upstroke brisk and symmetric, no bruits, no thyromegaly LUNGS:  Clear to auscultation bilaterally BACK:  No CVA tenderness CHEST:  Unremarkable HEART:  PMI not displaced or sustained,S1 and S2 within normal limits, no S3, no S4, no clicks, no rubs, no murmurs ABD:  Flat, positive bowel sounds normal in frequency in pitch, no bruits, no rebound, no guarding, no midline pulsatile mass, no hepatomegaly, no splenomegaly EXT:  2 plus pulses throughout,  mild bilateral lower extremity  edema, no cyanosis no clubbing   EKG:  Sinus rhythm, rate 66, axis within normal limits, intervals within normal limits, no acute ST-T wave changes.  12/01/2014   ASSESSMENT AND PLAN  MVP: There is no evidence for this on exam and there wasn't any previous echo. No further workup is suggested.   DYSPNEA AND CHEST PAIN: Given her strong family history I will send her for a POET (Plain Old Exercise Treadmill)  FATIGUE: I will check a TSH  EDEMA: This might be related to her Cardizem. I'm going to stop her Maxzide which she is a diuretic and  instead give her a prescription for when necessary low dose Lasix. We talked about salt restriction as well.

## 2014-12-01 NOTE — Patient Instructions (Signed)
Medication Instructions:  Please stop your Maxzide. Start Furosemide 20 mg 1/2 to 1 tablet daily as needed for swelling. You may take your Metoprolol once a day. Continue all other medications as listed.  Testing/Procedures: Your physician has requested that you have an exercise tolerance test. For further information please visit [email or web address]. Please also follow instruction sheet, as given.  Follow-Up: Follow up in 18 months with Dr. Percival Spanish.  You will receive a letter in the mail 2 months before you are due.  Please call us when you receive this letter to schedule your follow up appointment.  If you need a refill on your cardiac medications before your next appointment, please call your pharmacy.  Thank you for choosing [facility]!!

## 2014-12-03 ENCOUNTER — Telehealth (HOSPITAL_COMMUNITY): Payer: Self-pay

## 2014-12-03 ENCOUNTER — Telehealth: Payer: Self-pay | Admitting: Cardiology

## 2014-12-03 MED ORDER — METOPROLOL SUCCINATE ER 25 MG PO TB24: 25.0000 mg | ORAL_TABLET | Freq: Two times a day (BID) | ORAL | Status: DC

## 2014-12-03 MED ORDER — ROSUVASTATIN CALCIUM 10 MG PO TABS: 10.0000 mg | ORAL_TABLET | Freq: Every morning | ORAL | Status: DC

## 2014-12-03 MED ORDER — PANTOPRAZOLE SODIUM 40 MG PO TBEC
40.0000 mg | DELAYED_RELEASE_TABLET | Freq: Two times a day (BID) | ORAL | Status: DC
Start: 2014-12-03 — End: 2015-10-24

## 2014-12-03 MED ORDER — DILTIAZEM HCL ER 180 MG PO CP24: 180.0000 mg | ORAL_CAPSULE | Freq: Every morning | ORAL | Status: DC

## 2014-12-03 NOTE — Telephone Encounter (Signed)
Refills sent in electronically #90 w/3 refills.  appt recall for Dr. Percival Spanish is 2018.

## 2014-12-03 NOTE — Telephone Encounter (Signed)
Encounter complete. 

## 2014-12-03 NOTE — Telephone Encounter (Signed)
°*  STAT* If patient is at the pharmacy, call can be transferred to refill team.   1. Which medications need to be refilled? (please list name of each medication and dose if known) Crestor,Metoprolol,Diltiazem,and Pantoprazade  2. Which pharmacy/location (including street and city if local pharmacy) is medication to be sent to?[facility]-[phone]  3. Do they need a 30 day or 90 day supply? Whatever amount she have been getting,she did not remember for each medicine

## 2014-12-29 ENCOUNTER — Encounter: Payer: 59 | Admitting: Physician Assistant

## 2015-01-12 ENCOUNTER — Other Ambulatory Visit: Payer: Self-pay | Admitting: Orthopedic Surgery

## 2015-06-21 ENCOUNTER — Telehealth: Payer: Self-pay | Admitting: *Deleted

## 2015-06-21 NOTE — Telephone Encounter (Signed)
Spoke with pt told her Dr Richarda Overlie nurse is off today and will have her give her a call about her medication

## 2015-06-21 NOTE — Telephone Encounter (Signed)
metoprolol succinate (TOPROL-XL) 25 MG 24 hr tablet  Medication   Date: 12/03/2014  Department: [facility]  Ordering/Authorizing: Minus Breeding, MD      Order Providers    Prescribing Provider Encounter Provider   Minus Breeding, MD Minus Breeding, MD    Medication Detail      Disp Refills Start End     metoprolol succinate (TOPROL-XL) 25 MG 24 hr tablet 180 tablet 3 12/03/2014     Sig - Route: Take 1 tablet (25 mg total) by mouth 2 (two) times daily. - Oral    E-Prescribing Status: Receipt confirmed by pharmacy (12/03/2014 2:18 PM EST)     Pharmacy    [facility] [id number]    AVS Reports     Date/Time Report Action User    12/01/2014 1:07 PM After Visit Summary Printed Shellia Cleverly, RN      Patient Instructions     Medication Instructions:  Please stop your Maxzide. Start Furosemide 20 mg 1/2 to 1 tablet daily as needed for swelling. You may take your Metoprolol once a day. Continue all other medications as listed.

## 2015-06-21 NOTE — Telephone Encounter (Signed)
Leave message for pt to call back 

## 2015-06-22 NOTE — Telephone Encounter (Signed)
Lm to cb.  Metoprolol should be daily.  She may take prn dose on occasion if necessary.

## 2015-06-24 ENCOUNTER — Telehealth: Payer: Self-pay | Admitting: Cardiology

## 2015-06-24 MED ORDER — METOPROLOL SUCCINATE ER 25 MG PO TB24: 25.0000 mg | ORAL_TABLET | Freq: Every day | ORAL | Status: DC

## 2015-06-24 NOTE — Telephone Encounter (Signed)
F/u Message ° °Pt returning Rn call. Please call back to discuss  °

## 2015-06-24 NOTE — Telephone Encounter (Signed)
Spoke with pt who is aware to take Metoprolol succinate 25 mg a day.  New RX sent into pharmacy.

## 2015-06-24 NOTE — Telephone Encounter (Signed)
Please see prior telephone encounter.  

## 2015-06-24 NOTE — Telephone Encounter (Signed)
Lm to cb again

## 2015-09-07 ENCOUNTER — Encounter: Payer: Self-pay | Admitting: Gastroenterology

## 2015-09-07 LAB — HM MAMMOGRAPHY: HM MAMMO: NORMAL (ref 0–4)

## 2015-09-15 ENCOUNTER — Encounter: Payer: Self-pay | Admitting: Family Medicine

## 2015-10-12 ENCOUNTER — Telehealth: Payer: Self-pay | Admitting: Cardiology

## 2015-10-12 DIAGNOSIS — R0600 Dyspnea, unspecified: Secondary | ICD-10-CM | POA: Insufficient documentation

## 2015-10-12 DIAGNOSIS — I059 Rheumatic mitral valve disease, unspecified: Secondary | ICD-10-CM

## 2015-10-12 NOTE — Telephone Encounter (Signed)
New message      Pt cancelled her stress test last dec.  Calling to see if Dr Percival Spanish still want her to have the stress test.  Please call

## 2015-10-12 NOTE — Telephone Encounter (Signed)
SPOKE TO PATIENT . PATIENT STATES SHE IS STILL HAVING ISSUES  - TIRED, SHORT OF BREATH, AN UNABLE TO SLEEP. PATIENT STATES SHE CANCELLED APPOINTMENT IN DEC 2017 --DUE LOSING HER JOB AND NO INSURANCE.  RN INFORMED PATIENT SINCE SYMPTOMS ARE STILL PRESENT . SUGGEST FOLLOW THROUGH AND HAVE TEST DONE. PATIENT IN AGREEMENT. ORDER PLACED.

## 2015-10-12 NOTE — Telephone Encounter (Signed)
Returned call to patient no answer.LMTC. 

## 2015-10-24 ENCOUNTER — Other Ambulatory Visit: Payer: Self-pay

## 2015-10-24 MED ORDER — METOPROLOL SUCCINATE ER 25 MG PO TB24: 25.0000 mg | ORAL_TABLET | Freq: Every day | ORAL | 3 refills | Status: DC

## 2015-10-24 MED ORDER — DILTIAZEM HCL ER 180 MG PO CP24: 180.0000 mg | ORAL_CAPSULE | Freq: Every morning | ORAL | 3 refills | Status: DC

## 2015-10-24 MED ORDER — ROSUVASTATIN CALCIUM 10 MG PO TABS: 10.0000 mg | ORAL_TABLET | Freq: Every morning | ORAL | 3 refills | Status: DC

## 2015-10-24 MED ORDER — PANTOPRAZOLE SODIUM 40 MG PO TBEC: 40.0000 mg | DELAYED_RELEASE_TABLET | Freq: Two times a day (BID) | ORAL | 3 refills | Status: DC

## 2015-10-24 MED ORDER — FUROSEMIDE 20 MG PO TABS: 20.0000 mg | ORAL_TABLET | Freq: Every day | ORAL | 3 refills | Status: DC | PRN

## 2015-10-24 NOTE — Telephone Encounter (Signed)
Rx request sent to pharmacy.  

## 2015-11-09 ENCOUNTER — Other Ambulatory Visit (INDEPENDENT_AMBULATORY_CARE_PROVIDER_SITE_OTHER): Payer: 59

## 2015-11-09 ENCOUNTER — Encounter: Payer: Self-pay | Admitting: Gastroenterology

## 2015-11-09 ENCOUNTER — Encounter (INDEPENDENT_AMBULATORY_CARE_PROVIDER_SITE_OTHER): Payer: Self-pay

## 2015-11-09 ENCOUNTER — Ambulatory Visit (INDEPENDENT_AMBULATORY_CARE_PROVIDER_SITE_OTHER): Payer: 59 | Admitting: Gastroenterology

## 2015-11-09 VITALS — BP 122/74 | HR 54 | Ht 60.0 in | Wt 182.8 lb

## 2015-11-09 DIAGNOSIS — R1011 Right upper quadrant pain: Secondary | ICD-10-CM

## 2015-11-09 DIAGNOSIS — Z1211 Encounter for screening for malignant neoplasm of colon: Secondary | ICD-10-CM

## 2015-11-09 DIAGNOSIS — K219 Gastro-esophageal reflux disease without esophagitis: Secondary | ICD-10-CM | POA: Diagnosis not present

## 2015-11-09 DIAGNOSIS — Z1212 Encounter for screening for malignant neoplasm of rectum: Secondary | ICD-10-CM

## 2015-11-09 LAB — COMPREHENSIVE METABOLIC PANEL
ALT: 33 U/L (ref 0–35)
AST: 32 U/L (ref 0–37)
Albumin: 4.4 g/dL (ref 3.5–5.2)
Alkaline Phosphatase: 65 U/L (ref 39–117)
BUN: 15 mg/dL (ref 6–23)
CALCIUM: 9.6 mg/dL (ref 8.4–10.5)
CO2: 33 meq/L — AB (ref 19–32)
CREATININE: 1 mg/dL (ref 0.40–1.20)
Chloride: 103 mEq/L (ref 96–112)
GFR: 61.77 mL/min (ref >60.00)
GLUCOSE: 118 mg/dL — AB (ref 70–99)
Potassium: 4.1 mEq/L (ref 3.5–5.1)
Sodium: 141 mEq/L (ref 135–145)
Total Bilirubin: 0.7 mg/dL (ref 0.2–1.2)
Total Protein: 7.4 g/dL (ref 6.0–8.3)

## 2015-11-09 LAB — CBC WITH DIFFERENTIAL/PLATELET
BASOS PCT: 0.4 % (ref 0.0–3.0)
Basophils Absolute: 0 10*3/uL (ref 0.0–0.1)
EOS PCT: 0.7 % (ref 0.0–5.0)
Eosinophils Absolute: 0.1 10*3/uL (ref 0.0–0.7)
HCT: 40.9 % (ref 36.0–46.0)
Hemoglobin: 13.6 g/dL (ref 12.0–15.0)
LYMPHS ABS: 3.1 10*3/uL (ref 0.7–4.0)
Lymphocytes Relative: 30.5 % (ref 12.0–46.0)
MCHC: 33.3 g/dL (ref 30.0–36.0)
MCV: 94.9 fl (ref 78.0–100.0)
MONO ABS: 0.7 10*3/uL (ref 0.1–1.0)
Monocytes Relative: 6.6 % (ref 3.0–12.0)
NEUTROS ABS: 6.2 10*3/uL (ref 1.4–7.7)
NEUTROS PCT: 61.8 % (ref 43.0–77.0)
PLATELETS: 229 10*3/uL (ref 150.0–400.0)
RBC: 4.31 Mil/uL (ref 3.87–5.11)
RDW: 13.6 % (ref 11.5–15.5)
WBC: 10.1 10*3/uL (ref 4.0–10.5)

## 2015-11-09 LAB — LIPASE: Lipase: 42 U/L (ref 11.0–59.0)

## 2015-11-09 NOTE — Patient Instructions (Signed)
Your physician has requested that you go to the basement for lab work before leaving today.  You have been scheduled for an abdominal ultrasound at [facility] (1st floor of hospital) on 11/17/15 at 8:30am. Please arrive 15 minutes prior to your appointment for registration. Make certain not to have anything to eat or drink 6 hours prior to your appointment. Should you need to reschedule your appointment, please contact radiology at [phone]. This test typically takes about 30 minutes to perform.  Patient advised to avoid spicy, acidic, citrus, chocolate, mints, fruit and fruit juices.  Limit the intake of caffeine, alcohol and Soda.  Don't exercise too soon after eating.  Don't lie down within 3-4 hours of eating.  Elevate the head of your bed.  You have been given information on colon cancer screening.   Thank you for choosing me and [facility].  Pricilla Riffle. Dagoberto Ligas., MD., Marval Regal

## 2015-11-09 NOTE — Progress Notes (Addendum)
    History of Present Illness: This is a 50 year old female self referred for the evaluation of RUQ and GERD. She relates several months of intermittent postprandial right upper quadrant pain that lasts for a few hours and then resolves. She states her brother recently had cholecystectomy. She states her reflux symptoms have been more active recently with occasional daytime heartburn and occasional nighttime regurgitation. She has experienced a gradual weight gain over the past few years. She takes pantoprazole 40 mg twice daily. Denies weight loss, constipation, diarrhea, change in stool caliber, melena, hematochezia, nausea, vomiting, dysphagia, chest pain.  EGD 1990 by Dr. Velora Heckler showed a small hiatal hernia mild gastritis mild duodenitis mild esophagitis  EGD 2000 by Dr. Fuller Plan showed a small sliding hiatal hernia. She was treated with AcipHex at that time twice daily for management of GERD.  Review of Systems: Pertinent positive and negative review of systems were noted in the above HPI section. All other review of systems were otherwise negative.  Current Medications, Allergies, Past Medical History, Past Surgical History, Family History and Social History were reviewed in [employer] record.  Physical Exam: General: Well developed, well nourished, no acute distress Head: Normocephalic and atraumatic Eyes:  sclerae anicteric, EOMI Ears: Normal auditory acuity Mouth: No deformity or lesions Neck: Supple, no masses or thyromegaly Lungs: Clear throughout to auscultation Heart: Regular rate and rhythm; no murmurs, rubs or bruits Abdomen: Soft, non tender and non distended. No masses, hepatosplenomegaly or hernias noted. Normal Bowel sounds Rectal: deferred to colonoscopy Musculoskeletal: Symmetrical with no gross deformities  Skin: No lesions on visible extremities Pulses:  Normal pulses noted Extremities: No clubbing, cyanosis, edema or deformities  noted Neurological: Alert oriented x 4, grossly nonfocal Cervical Nodes:  No significant cervical adenopathy Inguinal Nodes: No significant inguinal adenopathy Psychological:  Alert and cooperative. Normal mood and affect  Assessment and Recommendations:  1.  RUQ pain. R/O cholelithiasis. Abd [place], CBC, CMP, lipase. REV in 6 weeks.   2. GERD. Continue pantoprazole 40 mg twice daily. Intensify antireflux measures. Use 4 inch bed blocks. Avoid eating for 3-4 hours before bedtime. Attempt to lose at least 10 pounds to start to help GERD. BMI currently 35.7. Consider nighttime H2RA or a different PPI if symptoms not improved.   3. CRC screening, average risk.  Schedule screening colonoscopy at next visit.

## 2015-11-10 ENCOUNTER — Other Ambulatory Visit: Payer: Self-pay

## 2015-11-10 DIAGNOSIS — R7309 Other abnormal glucose: Secondary | ICD-10-CM

## 2015-11-10 NOTE — Progress Notes (Signed)
Patient notified She will come for repeat lab work in 1 week

## 2015-11-11 ENCOUNTER — Telehealth (HOSPITAL_COMMUNITY): Payer: Self-pay

## 2015-11-11 NOTE — Telephone Encounter (Signed)
Encounter complete. 

## 2015-11-16 ENCOUNTER — Ambulatory Visit (HOSPITAL_COMMUNITY)
Admission: RE | Admit: 2015-11-16 | Discharge: 2015-11-16 | Disposition: A | Discharge status: Home / Self Care | Payer: 59 | Source: Ambulatory Visit | Attending: Cardiology | Admitting: Cardiology

## 2015-11-16 DIAGNOSIS — R1011 Right upper quadrant pain: Secondary | ICD-10-CM | POA: Diagnosis present

## 2015-11-16 DIAGNOSIS — R0600 Dyspnea, unspecified: Secondary | ICD-10-CM

## 2015-11-16 DIAGNOSIS — I059 Rheumatic mitral valve disease, unspecified: Secondary | ICD-10-CM | POA: Diagnosis not present

## 2015-11-16 LAB — EXERCISE TOLERANCE TEST
CSEPED: 6 min
CSEPEW: 7.2 METS
CSEPPHR: 144 {beats}/min
Exercise duration (sec): 10 s
MPHR: 168 {beats}/min
Percent HR: 85 %
RPE: 18
Rest HR: 53 {beats}/min

## 2015-11-17 ENCOUNTER — Other Ambulatory Visit (INDEPENDENT_AMBULATORY_CARE_PROVIDER_SITE_OTHER): Payer: 59

## 2015-11-17 ENCOUNTER — Telehealth: Payer: Self-pay | Admitting: Cardiology

## 2015-11-17 ENCOUNTER — Ambulatory Visit (HOSPITAL_COMMUNITY)
Admission: RE | Admit: 2015-11-17 | Discharge: 2015-11-17 | Disposition: A | Discharge status: Home / Self Care | Payer: 59 | Source: Ambulatory Visit | Attending: Gastroenterology | Admitting: Gastroenterology

## 2015-11-17 DIAGNOSIS — R7309 Other abnormal glucose: Secondary | ICD-10-CM

## 2015-11-17 DIAGNOSIS — R1011 Right upper quadrant pain: Secondary | ICD-10-CM | POA: Diagnosis not present

## 2015-11-17 LAB — BASIC METABOLIC PANEL
BUN: 12 mg/dL (ref 6–23)
CHLORIDE: 105 meq/L (ref 96–112)
CO2: 31 meq/L (ref 19–32)
CREATININE: 0.99 mg/dL (ref 0.40–1.20)
Calcium: 9.3 mg/dL (ref 8.4–10.5)
GFR: 62.48 mL/min (ref >60.00)
GLUCOSE: 127 mg/dL — AB (ref 70–99)
Potassium: 4.2 mEq/L (ref 3.5–5.1)
Sodium: 143 mEq/L (ref 135–145)

## 2015-11-17 NOTE — Progress Notes (Signed)
Cc to Dr. Fuller Plan

## 2015-11-17 NOTE — Telephone Encounter (Signed)
Stress test results given

## 2015-11-17 NOTE — Telephone Encounter (Signed)
New message  Pt is returning call to [facility]  Please call back

## 2015-11-18 ENCOUNTER — Other Ambulatory Visit: Payer: Self-pay

## 2015-11-18 DIAGNOSIS — R1013 Epigastric pain: Secondary | ICD-10-CM

## 2015-12-05 ENCOUNTER — Ambulatory Visit (HOSPITAL_COMMUNITY)
Admission: RE | Admit: 2015-12-05 | Discharge: 2015-12-05 | Disposition: A | Discharge status: Home / Self Care | Payer: 59 | Source: Ambulatory Visit | Attending: Gastroenterology | Admitting: Gastroenterology

## 2015-12-05 DIAGNOSIS — R1013 Epigastric pain: Secondary | ICD-10-CM | POA: Insufficient documentation

## 2015-12-05 MED ORDER — TECHNETIUM TC 99M MEBROFENIN IV KIT: 5.1800 | PACK | Freq: Once | INTRAVENOUS | Status: DC | PRN

## 2016-01-06 DIAGNOSIS — N200 Calculus of kidney: Secondary | ICD-10-CM | POA: Diagnosis not present

## 2016-01-09 ENCOUNTER — Ambulatory Visit: Payer: 59 | Admitting: Gastroenterology

## 2016-01-30 ENCOUNTER — Encounter: Payer: Self-pay | Admitting: Family Medicine

## 2016-02-14 ENCOUNTER — Ambulatory Visit: Payer: 59 | Admitting: Gastroenterology

## 2016-02-14 ENCOUNTER — Telehealth: Payer: Self-pay

## 2016-02-14 NOTE — Telephone Encounter (Signed)
Do you want to charge? 

## 2016-02-14 NOTE — Telephone Encounter (Signed)
No charge this time. 

## 2016-02-14 NOTE — Telephone Encounter (Signed)
-----   Message from Dow Adolph sent at 02/14/2016 11:32 AM EST ----- Patient rescheduled appt for today bc she has the flu

## 2016-02-15 DIAGNOSIS — M7662 Achilles tendinitis, left leg: Secondary | ICD-10-CM | POA: Diagnosis not present

## 2016-02-15 DIAGNOSIS — M25572 Pain in left ankle and joints of left foot: Secondary | ICD-10-CM | POA: Diagnosis not present

## 2016-02-15 DIAGNOSIS — N200 Calculus of kidney: Secondary | ICD-10-CM | POA: Diagnosis not present

## 2016-03-14 DIAGNOSIS — M25572 Pain in left ankle and joints of left foot: Secondary | ICD-10-CM | POA: Diagnosis not present

## 2016-03-14 DIAGNOSIS — M7662 Achilles tendinitis, left leg: Secondary | ICD-10-CM | POA: Diagnosis not present

## 2016-03-21 DIAGNOSIS — M7662 Achilles tendinitis, left leg: Secondary | ICD-10-CM | POA: Diagnosis not present

## 2016-03-28 DIAGNOSIS — G8929 Other chronic pain: Secondary | ICD-10-CM | POA: Diagnosis not present

## 2016-03-28 DIAGNOSIS — M25572 Pain in left ankle and joints of left foot: Secondary | ICD-10-CM | POA: Diagnosis not present

## 2016-04-02 ENCOUNTER — Ambulatory Visit: Payer: 59 | Admitting: Gastroenterology

## 2016-04-12 DIAGNOSIS — J322 Chronic ethmoidal sinusitis: Secondary | ICD-10-CM | POA: Diagnosis not present

## 2016-04-12 DIAGNOSIS — J320 Chronic maxillary sinusitis: Secondary | ICD-10-CM | POA: Diagnosis not present

## 2016-04-12 DIAGNOSIS — R221 Localized swelling, mass and lump, neck: Secondary | ICD-10-CM | POA: Diagnosis not present

## 2016-04-26 ENCOUNTER — Other Ambulatory Visit (HOSPITAL_COMMUNITY): Payer: Self-pay | Admitting: Otolaryngology

## 2016-04-26 ENCOUNTER — Other Ambulatory Visit: Payer: Self-pay | Admitting: Otolaryngology

## 2016-04-26 DIAGNOSIS — R05 Cough: Secondary | ICD-10-CM

## 2016-04-26 DIAGNOSIS — L040 Acute lymphadenitis of face, head and neck: Secondary | ICD-10-CM | POA: Diagnosis not present

## 2016-04-26 DIAGNOSIS — R059 Cough, unspecified: Secondary | ICD-10-CM

## 2016-04-27 ENCOUNTER — Ambulatory Visit
Admission: RE | Admit: 2016-04-27 | Discharge: 2016-04-27 | Disposition: A | Discharge status: Home / Self Care | Payer: 59 | Source: Ambulatory Visit | Attending: Otolaryngology | Admitting: Otolaryngology

## 2016-04-27 DIAGNOSIS — I10 Essential (primary) hypertension: Secondary | ICD-10-CM | POA: Diagnosis not present

## 2016-04-27 DIAGNOSIS — R05 Cough: Secondary | ICD-10-CM

## 2016-04-27 DIAGNOSIS — R059 Cough, unspecified: Secondary | ICD-10-CM

## 2016-05-02 ENCOUNTER — Ambulatory Visit (HOSPITAL_COMMUNITY)
Admission: RE | Admit: 2016-05-02 | Discharge: 2016-05-02 | Disposition: A | Discharge status: Home / Self Care | Payer: 59 | Source: Ambulatory Visit | Attending: Otolaryngology | Admitting: Otolaryngology

## 2016-05-02 ENCOUNTER — Encounter: Payer: Self-pay | Admitting: Cardiology

## 2016-05-02 DIAGNOSIS — I889 Nonspecific lymphadenitis, unspecified: Secondary | ICD-10-CM | POA: Diagnosis not present

## 2016-05-02 DIAGNOSIS — L040 Acute lymphadenitis of face, head and neck: Secondary | ICD-10-CM | POA: Diagnosis present

## 2016-05-04 ENCOUNTER — Other Ambulatory Visit: Payer: Self-pay | Admitting: Otolaryngology

## 2016-05-04 DIAGNOSIS — E041 Nontoxic single thyroid nodule: Secondary | ICD-10-CM

## 2016-05-07 ENCOUNTER — Ambulatory Visit: Payer: 59 | Admitting: Gastroenterology

## 2016-05-14 ENCOUNTER — Ambulatory Visit
Admission: RE | Admit: 2016-05-14 | Discharge: 2016-05-14 | Disposition: A | Discharge status: Home / Self Care | Payer: 59 | Source: Ambulatory Visit | Attending: Otolaryngology | Admitting: Otolaryngology

## 2016-05-14 ENCOUNTER — Encounter: Payer: Self-pay | Admitting: Cardiology

## 2016-05-14 DIAGNOSIS — E041 Nontoxic single thyroid nodule: Secondary | ICD-10-CM

## 2016-05-23 DIAGNOSIS — M7672 Peroneal tendinitis, left leg: Secondary | ICD-10-CM | POA: Diagnosis not present

## 2016-05-23 DIAGNOSIS — M25572 Pain in left ankle and joints of left foot: Secondary | ICD-10-CM | POA: Diagnosis not present

## 2016-05-23 DIAGNOSIS — G8929 Other chronic pain: Secondary | ICD-10-CM | POA: Diagnosis not present

## 2016-05-23 DIAGNOSIS — N200 Calculus of kidney: Secondary | ICD-10-CM | POA: Diagnosis not present

## 2016-06-06 DIAGNOSIS — M7672 Peroneal tendinitis, left leg: Secondary | ICD-10-CM | POA: Diagnosis not present

## 2016-06-08 DIAGNOSIS — M7672 Peroneal tendinitis, left leg: Secondary | ICD-10-CM | POA: Diagnosis not present

## 2016-06-11 DIAGNOSIS — M7672 Peroneal tendinitis, left leg: Secondary | ICD-10-CM | POA: Diagnosis not present

## 2016-06-22 DIAGNOSIS — M7672 Peroneal tendinitis, left leg: Secondary | ICD-10-CM | POA: Diagnosis not present

## 2016-06-25 DIAGNOSIS — M7672 Peroneal tendinitis, left leg: Secondary | ICD-10-CM | POA: Diagnosis not present

## 2016-07-03 DIAGNOSIS — M7672 Peroneal tendinitis, left leg: Secondary | ICD-10-CM | POA: Diagnosis not present

## 2016-07-04 NOTE — Progress Notes (Signed)
HPI The patient presents for evaluation having previously been seen by Dr. Rollene Fare.  He followed her for many years her mitral valve prolapse. Echo from 2014 demonstrated no evidence of prolapse and no regurgitation. He has also followed her because of a family history of early onset heart disease. I reviewed a CT coronary angiogram from 2008 which demonstrated normal coronaries and no evidence of calcium. Her mother also apparently had aortic root enlargement and she describes a root replacement and bypass.   I have not seen the patient since Nov 2016.  She was having dyspnea.  POET (Plain Old Exercise Treadmill) was negative for ischemia.   She presents with several somatic complaints including left shoulder pain when she is lying on her left side. She can roll to the right and goes away. She's been noticing some of this discomfort during the day with activity as well. She feels hot particularly at night when she's going to bed she's not describing sweating. She has to have a fan on herself. She feels fatigued particularly taking a warm bath. She has increased swelling in her right leg as she's chronically had swelling in her left leg. She's not describing PND or orthopnea. She's not describing new palpitations, presyncope or syncope. She's not describing substernal pain.  Allergies  Allergen Reactions  . Penicillins Hives and Rash  . Vancomycin Swelling    Other reaction(s): Redness  . Iohexol Hives     Code: HIVES, Desc: HIVES WITH IV CONTRAST MEDIA- PT REQUIRES 13 HR PRE-MEDS, Onset Date: [id number]     Current Outpatient Prescriptions  Medication Sig Dispense Refill  . allopurinol (ZYLOPRIM) 300 MG tablet Take 300 mg by mouth daily.    Marland Kitchen diltiazem (DILACOR XR) 180 MG 24 hr capsule Take 1 capsule (180 mg total) by mouth every morning. 90 capsule 3  . furosemide (LASIX) 20 MG tablet Take 1 tablet (20 mg total) by mouth daily as needed. Take 1/2 to 1 tablets daily as needed for swelling 90  tablet 3  . metoprolol succinate (TOPROL-XL) 25 MG 24 hr tablet Take 1 tablet (25 mg total) by mouth daily. 90 tablet 3  . pantoprazole (PROTONIX) 40 MG tablet Take 1 tablet (40 mg total) by mouth 2 (two) times daily. 180 tablet 3  . Potassium Citrate 15 MEQ (1620 MG) TBCR Take 15 mEq by mouth 3 (three) times daily.    . rosuvastatin (CRESTOR) 10 MG tablet Take 1 tablet (10 mg total) by mouth every morning. 90 tablet 3   No current facility-administered medications for this visit.     Past Medical History:  Diagnosis Date  . GERD (gastroesophageal reflux disease)   . History of kidney stones   . HTN (hypertension)   . Hyperlipidemia   . Mild obstructive sleep apnea    PER STUDY 07-07-2010--  MILD OSA/  NO CPAP RX    . Renal calculus, bilateral    NON-OBSTRUTIVE  . Right ureteral stone   . Splenic flexure syndrome   . Wears glasses     Past Surgical History:  Procedure Laterality Date  . CARDIOVASCULAR STRESS TEST  11-08-2009  DR Rollene Fare   NORMAL PERFUSION STUDY/ NO ISCHEMIA/ EF 63%  . CYSTOSCOPY W/ URETERAL STENT PLACEMENT  05/21/2011   Procedure: CYSTOSCOPY WITH RETROGRADE PYELOGRAM/URETERAL STENT PLACEMENT;  Surgeon: Malka So, MD;  Location: [facility];  Service: Urology;  Laterality: Left;  . CYSTOSCOPY WITH STENT PLACEMENT Right 11/20/2012   Procedure: CYSTOSCOPY WITH STENT PLACEMENT;  Surgeon:  Irine Seal, MD;  Location: [facility];  Service: Urology;  Laterality: Right;  . CYSTOSCOPY WITH URETEROSCOPY, STONE BASKETRY AND STENT PLACEMENT Right 11/20/2012   Procedure: RIGHT URETEROSCOPY,BALLOON DILITATION, STONE EXTRACTION  ;  Surgeon: Irine Seal, MD;  Location: [place];  Service: Urology;  Laterality: Right;  . EXTRACORPOREAL SHOCK WAVE LITHOTRIPSY  LEFT 05-31-2011/   RIGHT 10-30-2012  . HOLMIUM LASER APPLICATION Right 46/28/6381   Procedure: HOLMIUM LASER APPLICATION;  Surgeon: Irine Seal, MD;  Location: [facility];   Service: Urology;  Laterality: Right;  . NASAL SEPTUM SURGERY  DEC 2013  . RIGHT WRIST EXPLORATION/ CAPSULOTOMY/ TENOLYSIS  03-30-2002  . TRANSTHORACIC ECHOCARDIOGRAM  03-27-2012   NORMAL LVF/  EF 55-60%/  NO EVIDENCE MVP  . VAGINAL HYSTERECTOMY  1999  . WRIST GANGLION EXCISION Right 11-14-2001     ROS:  Review of Systems  All other systems reviewed and are negative.    PHYSICAL EXAM BP 116/68   Pulse (!) 52   Ht 5' (1.524 m)   Wt 177 lb (80.3 kg)   BMI 34.57 kg/m   GENERAL:  Well appearing HEENT:  Pupils equal round and reactive, fundi not visualized, oral mucosa unremarkable NECK:  No jugular venous distention, waveform within normal limits, carotid upstroke brisk and symmetric, left possible carotid bruit, no thyromegaly LYMPHATICS:  No cervical, inguinal adenopathy LUNGS:  Clear to auscultation bilaterally BACK:  No CVA tenderness CHEST:  Unremarkable HEART:  PMI not displaced or sustained,S1 and S2 within normal limits, no S3, no S4, no clicks, no rubs, no murmurs ABD:  Flat, positive bowel sounds normal in frequency in pitch, no bruits, no rebound, no guarding, no midline pulsatile mass, no hepatomegaly, no splenomegaly EXT:  2 plus pulses throughout, mild left greater than right leg edema, no cyanosis no clubbing SKIN:  No rashes no nodules NEURO:  Cranial nerves II through XII grossly intact, motor grossly intact throughout PSYCH:  Cognitively intact, oriented to person place and time   EKG:  Sinus rhythm, rate 52, axis within normal limits, intervals within normal limits, no acute ST-T wave changes.  07/05/2016   ASSESSMENT AND PLAN  MVP: There is no evidence for this on exam and there wasn't any previous echo. No further work up is planned.  No further workup is suggested.   DYSPNEA AND CHEST PAIN: She had a negative POET (Plain Old Exercise Treadmill).  However, I will order a coronary calcium score   BRUIT:  I will order a carotid Doppler   EDEMA:  This  is mild and chronic.  The left ankle swelling is worse and related to previous trauma.  No change in therapy is indicated.

## 2016-07-05 ENCOUNTER — Ambulatory Visit (INDEPENDENT_AMBULATORY_CARE_PROVIDER_SITE_OTHER): Payer: 59 | Admitting: Cardiology

## 2016-07-05 ENCOUNTER — Encounter: Payer: Self-pay | Admitting: *Deleted

## 2016-07-05 ENCOUNTER — Encounter: Payer: Self-pay | Admitting: Cardiology

## 2016-07-05 VITALS — BP 116/68 | HR 52 | Ht 60.0 in | Wt 177.0 lb

## 2016-07-05 DIAGNOSIS — R079 Chest pain, unspecified: Secondary | ICD-10-CM | POA: Diagnosis not present

## 2016-07-05 DIAGNOSIS — M7672 Peroneal tendinitis, left leg: Secondary | ICD-10-CM | POA: Diagnosis not present

## 2016-07-05 DIAGNOSIS — N393 Stress incontinence (female) (male): Secondary | ICD-10-CM | POA: Insufficient documentation

## 2016-07-05 DIAGNOSIS — R0989 Other specified symptoms and signs involving the circulatory and respiratory systems: Secondary | ICD-10-CM | POA: Diagnosis not present

## 2016-07-05 DIAGNOSIS — N3941 Urge incontinence: Secondary | ICD-10-CM | POA: Insufficient documentation

## 2016-07-05 DIAGNOSIS — N202 Calculus of kidney with calculus of ureter: Secondary | ICD-10-CM | POA: Insufficient documentation

## 2016-07-05 MED ORDER — ROSUVASTATIN CALCIUM 10 MG PO TABS: 10.0000 mg | ORAL_TABLET | Freq: Every morning | ORAL | 3 refills | Status: DC

## 2016-07-05 MED ORDER — METOPROLOL SUCCINATE ER 25 MG PO TB24
25.0000 mg | ORAL_TABLET | Freq: Every day | ORAL | 3 refills | Status: DC
Start: 2016-07-05 — End: 2017-09-17

## 2016-07-05 MED ORDER — DILTIAZEM HCL ER 180 MG PO CP24: 180.0000 mg | ORAL_CAPSULE | Freq: Every morning | ORAL | 3 refills | Status: DC

## 2016-07-05 MED ORDER — FUROSEMIDE 20 MG PO TABS: 20.0000 mg | ORAL_TABLET | Freq: Every day | ORAL | 3 refills | Status: DC | PRN

## 2016-07-05 MED ORDER — METOPROLOL SUCCINATE ER 25 MG PO TB24: 25.0000 mg | ORAL_TABLET | Freq: Every day | ORAL | 3 refills | Status: DC

## 2016-07-05 NOTE — Patient Instructions (Addendum)
Medication Instructions:  Your physician recommends that you continue on your current medications as directed. Please refer to the Current Medication list given to you today.  New prescriptions have been sent to your pharmacy.  Labwork: NONE  Testing/Procedures: Your physician has requested that you have a carotid duplex. This test is an ultrasound of the carotid arteries in your neck. It looks at blood flow through these arteries that supply the brain with blood. Allow one hour for this exam. There are no restrictions or special instructions.  Your physician has ordered a CT coronary calcium score. This test is done at [address].   Follow-Up: Your physician recommends that you schedule a follow-up appointment: with Dr. Percival Spanish after testing completed.    Any Other Special Instructions Will Be Listed Below (If Applicable).   Coronary CalciumScan A coronary calcium scan is an imaging test used to look for deposits of calcium and other fatty materials (plaques) in the inner lining of the blood vessels of the heart (coronary arteries). These deposits of calcium and plaques can partly clog and narrow the coronary arteries without producing any symptoms or warning signs. This puts a person at risk for a heart attack. This test can detect these deposits before symptoms develop. Tell a health care provider about:  Any allergies you have.  All medicines you are taking, including vitamins, herbs, eye drops, creams, and over-the-counter medicines.  Any problems you or family members have had with anesthetic medicines.  Any blood disorders you have.  Any surgeries you have had.  Any medical conditions you have.  Whether you are pregnant or may be pregnant. What are the risks? Generally, this is a safe procedure. However, problems may occur, including:  Harm to a pregnant woman and her unborn baby. This test involves the use of radiation. Radiation exposure can be  dangerous to a pregnant woman and her unborn baby. If you are pregnant, you generally should not have this procedure done.  Slight increase in the risk of cancer. This is because of the radiation involved in the test. What happens before the procedure? No preparation is needed for this procedure. What happens during the procedure?  You will undress and remove any jewelry around your neck or chest.  You will put on a hospital gown.  Sticky electrodes will be placed on your chest. The electrodes will be connected to an electrocardiogram (ECG) machine to record a tracing of the electrical activity of your heart.  A CT scanner will take pictures of your heart. During this time, you will be asked to lie still and hold your breath for 2-3 seconds while a picture of your heart is being taken. The procedure may vary among health care providers and hospitals. What happens after the procedure?  You can get dressed.  You can return to your normal activities.  It is up to you to get the results of your test. Ask your health care provider, or the department that is doing the test, when your results will be ready. Summary  A coronary calcium scan is an imaging test used to look for deposits of calcium and other fatty materials (plaques) in the inner lining of the blood vessels of the heart (coronary arteries).  Generally, this is a safe procedure. Tell your health care provider if you are pregnant or may be pregnant.  No preparation is needed for this procedure.  A CT scanner will take pictures of your heart.  You can return  to your normal activities after the scan is done. This information is not intended to replace advice given to you by your health care provider. Make sure you discuss any questions you have with your health care provider. Document Released: 06/16/2007 Document Revised: 11/07/2015 Document Reviewed: 11/07/2015 Elsevier Interactive Patient Education  2017 [employer].     If you need a refill on your cardiac medications before your next appointment, please call your pharmacy.

## 2016-07-06 ENCOUNTER — Telehealth: Payer: Self-pay | Admitting: Cardiology

## 2016-07-06 NOTE — Telephone Encounter (Signed)
Returned call and made aware to cancel rx-rx sent to different pharmacy (patient preference).  Verbalized understanding.

## 2016-07-06 NOTE — Telephone Encounter (Signed)
New message    [place] calling needing clarification on a medication called Furosemide. Rx was sent in with 2 sets of instructions - requests call back

## 2016-07-16 ENCOUNTER — Ambulatory Visit (HOSPITAL_COMMUNITY): Admission: RE | Admit: 2016-07-16 | Payer: 59 | Source: Ambulatory Visit

## 2016-07-16 ENCOUNTER — Inpatient Hospital Stay: Admission: RE | Admit: 2016-07-16 | Payer: 59 | Source: Ambulatory Visit

## 2016-07-27 DIAGNOSIS — Z139 Encounter for screening, unspecified: Secondary | ICD-10-CM | POA: Diagnosis not present

## 2016-07-27 LAB — HEPATIC FUNCTION PANEL
ALT: 37 — AB (ref 7–35)
AST: 46 — AB (ref 13–35)
Alkaline Phosphatase: 93 (ref 25–125)
BILIRUBIN, TOTAL: 0.5

## 2016-07-27 LAB — BASIC METABOLIC PANEL
BUN: 10 (ref 4–21)
CREATININE: 0.9 (ref 0.5–1.1)
POTASSIUM: 4.1 (ref 3.4–5.3)
SODIUM: 144 (ref 137–147)

## 2016-07-27 LAB — LIPID PANEL
Cholesterol: 128 (ref 0–200)
Triglycerides: 97 (ref 40–160)

## 2016-07-27 LAB — CBC AND DIFFERENTIAL
HEMATOCRIT: 39 (ref 36–46)
Hemoglobin: 12.6 (ref 12.0–16.0)
PLATELETS: 168 (ref 150–399)

## 2016-07-31 ENCOUNTER — Encounter: Payer: Self-pay | Admitting: Family Medicine

## 2016-08-08 ENCOUNTER — Ambulatory Visit (HOSPITAL_COMMUNITY)
Admission: RE | Admit: 2016-08-08 | Discharge: 2016-08-08 | Disposition: A | Discharge status: Home / Self Care | Payer: 59 | Source: Ambulatory Visit | Attending: Cardiology | Admitting: Cardiology

## 2016-08-08 ENCOUNTER — Ambulatory Visit (INDEPENDENT_AMBULATORY_CARE_PROVIDER_SITE_OTHER)
Admission: RE | Admit: 2016-08-08 | Discharge: 2016-08-08 | Disposition: A | Discharge status: Home / Self Care | Payer: Self-pay | Source: Ambulatory Visit | Attending: Cardiology | Admitting: Cardiology

## 2016-08-08 DIAGNOSIS — R0989 Other specified symptoms and signs involving the circulatory and respiratory systems: Secondary | ICD-10-CM

## 2016-08-08 DIAGNOSIS — R079 Chest pain, unspecified: Secondary | ICD-10-CM

## 2016-08-08 DIAGNOSIS — N200 Calculus of kidney: Secondary | ICD-10-CM | POA: Diagnosis not present

## 2016-08-08 DIAGNOSIS — M25572 Pain in left ankle and joints of left foot: Secondary | ICD-10-CM | POA: Diagnosis not present

## 2016-08-08 DIAGNOSIS — M7672 Peroneal tendinitis, left leg: Secondary | ICD-10-CM | POA: Diagnosis not present

## 2016-08-08 DIAGNOSIS — I6523 Occlusion and stenosis of bilateral carotid arteries: Secondary | ICD-10-CM | POA: Diagnosis not present

## 2016-08-08 DIAGNOSIS — G8929 Other chronic pain: Secondary | ICD-10-CM | POA: Diagnosis not present

## 2016-08-14 ENCOUNTER — Encounter: Payer: Self-pay | Admitting: Family Medicine

## 2016-08-16 ENCOUNTER — Other Ambulatory Visit: Payer: Self-pay | Admitting: Orthopedic Surgery

## 2016-08-19 NOTE — Progress Notes (Signed)
HPI The patient presents for evaluation having previously been seen by Dr. Rollene Fare.  He followed her for many years for her mitral valve prolapse. Echo from 2014 demonstrated no evidence of prolapse and no regurgitation. He has also followed her because of a family history of early onset heart disease. I reviewed a CT coronary angiogram from 2008 which demonstrated normal coronaries and no evidence of calcium. Her mother also apparently had aortic root enlargement and she describes a root replacement and bypass.  In 2016 she had dyspnea and a  POET (Plain Old Exercise Treadmill) which was negative for ischemia.   She presented recently with leg swelling and atypical chest pain.  However, coronary calcium was zero.  She has had no new complaints.  She still has mild leg edema.  She is going to get her ankle fixed.     Allergies  Allergen Reactions  . Penicillins Hives and Rash  . Vancomycin Swelling    Other reaction(s): Redness  . Iohexol Hives     Code: HIVES, Desc: HIVES WITH IV CONTRAST MEDIA- PT REQUIRES 13 HR PRE-MEDS, Onset Date: [id number]     Current Outpatient Prescriptions  Medication Sig Dispense Refill  . allopurinol (ZYLOPRIM) 300 MG tablet Take 300 mg by mouth daily.    Marland Kitchen diltiazem (DILACOR XR) 180 MG 24 hr capsule Take 1 capsule (180 mg total) by mouth every morning. 90 capsule 3  . furosemide (LASIX) 20 MG tablet Take 1 tablet (20 mg total) by mouth daily as needed. Take 1/2 to 1 tablets daily as needed for swelling 90 tablet 3  . metoprolol succinate (TOPROL-XL) 25 MG 24 hr tablet Take 1 tablet (25 mg total) by mouth daily. 90 tablet 3  . pantoprazole (PROTONIX) 40 MG tablet Take 1 tablet (40 mg total) by mouth 2 (two) times daily. 180 tablet 3  . Potassium Citrate 15 MEQ (1620 MG) TBCR Take 15 mEq by mouth 3 (three) times daily.     No current facility-administered medications for this visit.     Past Medical History:  Diagnosis Date  . GERD (gastroesophageal reflux  disease)   . History of kidney stones   . HTN (hypertension)   . Hyperlipidemia   . Mild obstructive sleep apnea    PER STUDY 07-07-2010--  MILD OSA/  NO CPAP RX    . Renal calculus, bilateral    NON-OBSTRUTIVE  . Right ureteral stone   . Splenic flexure syndrome   . Wears glasses     Past Surgical History:  Procedure Laterality Date  . CARDIOVASCULAR STRESS TEST  11-08-2009  DR Rollene Fare   NORMAL PERFUSION STUDY/ NO ISCHEMIA/ EF 63%  . CYSTOSCOPY W/ URETERAL STENT PLACEMENT  05/21/2011   Procedure: CYSTOSCOPY WITH RETROGRADE PYELOGRAM/URETERAL STENT PLACEMENT;  Surgeon: Malka So, MD;  Location: [facility];  Service: Urology;  Laterality: Left;  . CYSTOSCOPY WITH STENT PLACEMENT Right 11/20/2012   Procedure: CYSTOSCOPY WITH STENT PLACEMENT;  Surgeon: Irine Seal, MD;  Location[facility];  Service: Urology;  Laterality: Right;  . CYSTOSCOPY WITH URETEROSCOPY, STONE BASKETRY AND STENT PLACEMENT Right 11/20/2012   Procedure: RIGHT URETEROSCOPY,BALLOON DILITATION, STONE EXTRACTION  ;  Surgeon: Irine Seal, MD;  Location: [place];  Service: Urology;  Laterality: Right;  . EXTRACORPOREAL SHOCK WAVE LITHOTRIPSY  LEFT 05-31-2011/   RIGHT 10-30-2012  . HOLMIUM LASER APPLICATION Right 40/08/6759   Procedure: HOLMIUM LASER APPLICATION;  Surgeon: Irine Seal, MD;  Location: [facility];  Service: Urology;  Laterality: Right;  . NASAL SEPTUM SURGERY  DEC 2013  . RIGHT WRIST EXPLORATION/ CAPSULOTOMY/ TENOLYSIS  03-30-2002  . TRANSTHORACIC ECHOCARDIOGRAM  03-27-2012   NORMAL LVF/  EF 55-60%/  NO EVIDENCE MVP  . VAGINAL HYSTERECTOMY  1999  . WRIST GANGLION EXCISION Right 11-14-2001     ROS:  Review of Systems  All other systems reviewed and are negative.    PHYSICAL EXAM BP 134/72   Pulse (!) 54   Ht 5' (1.524 m)   Wt 176 lb (79.8 kg)   BMI 34.37 kg/m   GENERAL:  Well appearing NECK:  No jugular venous distention, waveform within normal  limits, carotid upstroke brisk and symmetric, no bruits, no thyromegaly LUNGS:  Clear to auscultation bilaterally BACK:  No CVA tenderness CHEST:  Unremarkable HEART:  PMI not displaced or sustained,S1 and S2 within normal limits, no S3, no S4, no clicks, no rubs, no murmurs ABD:  Flat, positive bowel sounds normal in frequency in pitch, no bruits, no rebound, no guarding, no midline pulsatile mass, no hepatomegaly, no splenomegaly EXT:  2 plus pulses throughout, no edema, no cyanosis no clubbing    ASSESSMENT AND PLAN  MVP: This was not evident on echo previously.  No further imaging.   DYSPNEA AND CHEST PAIN: She had a negative POET (Plain Old Exercise Treadmill) and a zero calcium score. No further testing.  BRUIT:    She had minimal plaque on Carotid Doppler.  No further testing is indicated.   EDEMA:   This is mild and chronic.  No further testing is indicated.   PREOP; Based on ACC/AHA guidelines, the patient would be at acceptable risk for the planned procedure without further cardiovascular testing.  DYSLIPIDEMIA She has been on a statin.  But her MESA risk is 1.6%.  She has no indication for statin or ASA from a cardiovascular standpoint.   She will stop the Crestor.   I reviewed the carotid study and CT calcium score for this appt.

## 2016-08-20 ENCOUNTER — Ambulatory Visit (INDEPENDENT_AMBULATORY_CARE_PROVIDER_SITE_OTHER): Payer: 59 | Admitting: Cardiology

## 2016-08-20 VITALS — BP 134/72 | HR 54 | Ht 60.0 in | Wt 176.0 lb

## 2016-08-20 DIAGNOSIS — E785 Hyperlipidemia, unspecified: Secondary | ICD-10-CM | POA: Diagnosis not present

## 2016-08-20 DIAGNOSIS — R0600 Dyspnea, unspecified: Secondary | ICD-10-CM | POA: Diagnosis not present

## 2016-08-20 DIAGNOSIS — Z01810 Encounter for preprocedural cardiovascular examination: Secondary | ICD-10-CM

## 2016-08-20 DIAGNOSIS — M7989 Other specified soft tissue disorders: Secondary | ICD-10-CM | POA: Diagnosis not present

## 2016-08-20 NOTE — Patient Instructions (Signed)
Medication Instructions:  STOP- Crestor  If you need a refill on your cardiac medications before your next appointment, please call your pharmacy.  Labwork: None Ordered  Testing/Procedures: None Ordered  Follow-Up: Your physician wants you to follow-up in: As Needed.   Thank you for choosing [facility]!!

## 2016-08-21 ENCOUNTER — Encounter: Payer: Self-pay | Admitting: Cardiology

## 2016-08-29 ENCOUNTER — Encounter (HOSPITAL_BASED_OUTPATIENT_CLINIC_OR_DEPARTMENT_OTHER): Payer: Self-pay | Admitting: *Deleted

## 2016-08-30 ENCOUNTER — Encounter (HOSPITAL_BASED_OUTPATIENT_CLINIC_OR_DEPARTMENT_OTHER)
Admission: RE | Admit: 2016-08-30 | Discharge: 2016-08-30 | Disposition: A | Discharge status: Home / Self Care | Payer: 59 | Source: Ambulatory Visit | Attending: Orthopedic Surgery | Admitting: Orthopedic Surgery

## 2016-08-30 DIAGNOSIS — Z01812 Encounter for preprocedural laboratory examination: Secondary | ICD-10-CM | POA: Diagnosis not present

## 2016-08-30 DIAGNOSIS — M7672 Peroneal tendinitis, left leg: Secondary | ICD-10-CM | POA: Diagnosis not present

## 2016-08-30 LAB — BASIC METABOLIC PANEL
ANION GAP: 6 (ref 5–15)
BUN: 11 mg/dL (ref 6–20)
CHLORIDE: 106 mmol/L (ref 101–111)
CO2: 28 mmol/L (ref 22–32)
Calcium: 9.3 mg/dL (ref 8.9–10.3)
Creatinine, Ser: 0.88 mg/dL (ref 0.44–1.00)
GFR calc non Af Amer: >60 mL/min (ref >60)
GLUCOSE: 147 mg/dL — AB (ref 65–99)
POTASSIUM: 4 mmol/L (ref 3.5–5.1)
Sodium: 140 mmol/L (ref 135–145)

## 2016-09-06 ENCOUNTER — Encounter (HOSPITAL_BASED_OUTPATIENT_CLINIC_OR_DEPARTMENT_OTHER): Payer: Self-pay | Admitting: Certified Registered"

## 2016-09-06 ENCOUNTER — Ambulatory Visit (HOSPITAL_BASED_OUTPATIENT_CLINIC_OR_DEPARTMENT_OTHER): Payer: 59 | Admitting: Certified Registered"

## 2016-09-06 ENCOUNTER — Encounter (HOSPITAL_BASED_OUTPATIENT_CLINIC_OR_DEPARTMENT_OTHER)
Admission: RE | Disposition: A | Discharge status: Home / Self Care | Payer: Self-pay | Source: Ambulatory Visit | Attending: Orthopedic Surgery

## 2016-09-06 ENCOUNTER — Ambulatory Visit (HOSPITAL_BASED_OUTPATIENT_CLINIC_OR_DEPARTMENT_OTHER)
Admission: RE | Admit: 2016-09-06 | Discharge: 2016-09-06 | Disposition: A | Discharge status: Home / Self Care | Payer: 59 | Source: Ambulatory Visit | Attending: Orthopedic Surgery | Admitting: Orthopedic Surgery

## 2016-09-06 DIAGNOSIS — I341 Nonrheumatic mitral (valve) prolapse: Secondary | ICD-10-CM | POA: Diagnosis not present

## 2016-09-06 DIAGNOSIS — G4733 Obstructive sleep apnea (adult) (pediatric): Secondary | ICD-10-CM | POA: Diagnosis not present

## 2016-09-06 DIAGNOSIS — E669 Obesity, unspecified: Secondary | ICD-10-CM | POA: Insufficient documentation

## 2016-09-06 DIAGNOSIS — Z6834 Body mass index (BMI) 34.0-34.9, adult: Secondary | ICD-10-CM | POA: Diagnosis not present

## 2016-09-06 DIAGNOSIS — Z8249 Family history of ischemic heart disease and other diseases of the circulatory system: Secondary | ICD-10-CM | POA: Insufficient documentation

## 2016-09-06 DIAGNOSIS — M65862 Other synovitis and tenosynovitis, left lower leg: Secondary | ICD-10-CM | POA: Diagnosis not present

## 2016-09-06 DIAGNOSIS — Z91041 Radiographic dye allergy status: Secondary | ICD-10-CM | POA: Diagnosis not present

## 2016-09-06 DIAGNOSIS — S86312A Strain of muscle(s) and tendon(s) of peroneal muscle group at lower leg level, left leg, initial encounter: Secondary | ICD-10-CM | POA: Insufficient documentation

## 2016-09-06 DIAGNOSIS — K219 Gastro-esophageal reflux disease without esophagitis: Secondary | ICD-10-CM | POA: Insufficient documentation

## 2016-09-06 DIAGNOSIS — Z880 Allergy status to penicillin: Secondary | ICD-10-CM | POA: Insufficient documentation

## 2016-09-06 DIAGNOSIS — Z79899 Other long term (current) drug therapy: Secondary | ICD-10-CM | POA: Insufficient documentation

## 2016-09-06 DIAGNOSIS — I10 Essential (primary) hypertension: Secondary | ICD-10-CM | POA: Insufficient documentation

## 2016-09-06 DIAGNOSIS — M7672 Peroneal tendinitis, left leg: Secondary | ICD-10-CM | POA: Diagnosis not present

## 2016-09-06 DIAGNOSIS — Z87442 Personal history of urinary calculi: Secondary | ICD-10-CM | POA: Insufficient documentation

## 2016-09-06 DIAGNOSIS — X58XXXA Exposure to other specified factors, initial encounter: Secondary | ICD-10-CM | POA: Insufficient documentation

## 2016-09-06 DIAGNOSIS — Z881 Allergy status to other antibiotic agents status: Secondary | ICD-10-CM | POA: Insufficient documentation

## 2016-09-06 DIAGNOSIS — Z9889 Other specified postprocedural states: Secondary | ICD-10-CM

## 2016-09-06 DIAGNOSIS — E785 Hyperlipidemia, unspecified: Secondary | ICD-10-CM | POA: Diagnosis not present

## 2016-09-06 DIAGNOSIS — S86192A Other injury of other muscle(s) and tendon(s) of posterior muscle group at lower leg level, left leg, initial encounter: Secondary | ICD-10-CM | POA: Diagnosis not present

## 2016-09-06 HISTORY — PX: TENDON REPAIR: SHX5111

## 2016-09-06 SURGERY — TENDON REPAIR
Anesthesia: General | Site: Ankle | Laterality: Left

## 2016-09-06 MED ORDER — HYDROMORPHONE HCL 1 MG/ML IJ SOLN: 0.2500 mg | INTRAMUSCULAR | Status: DC | PRN

## 2016-09-06 MED ORDER — MIDAZOLAM HCL 2 MG/2ML IJ SOLN
INTRAMUSCULAR | Status: AC
  Filled 2016-09-06: qty 2

## 2016-09-06 MED ORDER — SCOPOLAMINE 1 MG/3DAYS TD PT72: 1.0000 | MEDICATED_PATCH | Freq: Once | TRANSDERMAL | Status: DC | PRN

## 2016-09-06 MED ORDER — SENNA 8.6 MG PO TABS: 2.0000 | ORAL_TABLET | Freq: Two times a day (BID) | ORAL | 0 refills | Status: DC

## 2016-09-06 MED ORDER — MEPERIDINE HCL 25 MG/ML IJ SOLN: 6.2500 mg | INTRAMUSCULAR | Status: DC | PRN

## 2016-09-06 MED ORDER — PROPOFOL 10 MG/ML IV BOLUS
INTRAVENOUS | Status: DC | PRN
  Administered 2016-09-06: 150 mg via INTRAVENOUS

## 2016-09-06 MED ORDER — MIDAZOLAM HCL 2 MG/2ML IJ SOLN
1.0000 mg | INTRAMUSCULAR | Status: DC | PRN
  Administered 2016-09-06: 1 mg via INTRAVENOUS

## 2016-09-06 MED ORDER — 0.9 % SODIUM CHLORIDE (POUR BTL) OPTIME
TOPICAL | Status: DC | PRN
  Administered 2016-09-06: 120 mL

## 2016-09-06 MED ORDER — FENTANYL CITRATE (PF) 100 MCG/2ML IJ SOLN
INTRAMUSCULAR | Status: AC
  Filled 2016-09-06: qty 2

## 2016-09-06 MED ORDER — PROMETHAZINE HCL 25 MG/ML IJ SOLN: 6.2500 mg | INTRAMUSCULAR | Status: DC | PRN

## 2016-09-06 MED ORDER — BUPIVACAINE-EPINEPHRINE (PF) 0.5% -1:200000 IJ SOLN
INTRAMUSCULAR | Status: AC
Start: 2016-09-06 — End: 2016-09-06
  Filled 2016-09-06: qty 30

## 2016-09-06 MED ORDER — FENTANYL CITRATE (PF) 100 MCG/2ML IJ SOLN
50.0000 ug | INTRAMUSCULAR | Status: DC | PRN
  Administered 2016-09-06 (×2): 50 ug via INTRAVENOUS

## 2016-09-06 MED ORDER — SODIUM CHLORIDE 0.9 % IV SOLN: INTRAVENOUS | Status: DC

## 2016-09-06 MED ORDER — OXYCODONE HCL 5 MG/5ML PO SOLN: 5.0000 mg | Freq: Once | ORAL | Status: DC | PRN

## 2016-09-06 MED ORDER — CEFAZOLIN SODIUM-DEXTROSE 2-4 GM/100ML-% IV SOLN
INTRAVENOUS | Status: AC
  Filled 2016-09-06: qty 100

## 2016-09-06 MED ORDER — LACTATED RINGERS IV SOLN
INTRAVENOUS | Status: DC
  Administered 2016-09-06 (×2): via INTRAVENOUS

## 2016-09-06 MED ORDER — LIDOCAINE HCL (CARDIAC) 20 MG/ML IV SOLN
INTRAVENOUS | Status: DC | PRN
  Administered 2016-09-06: 60 mg via INTRAVENOUS

## 2016-09-06 MED ORDER — BUPIVACAINE-EPINEPHRINE 0.5% -1:200000 IJ SOLN
INTRAMUSCULAR | Status: DC | PRN
  Administered 2016-09-06: 20 mL

## 2016-09-06 MED ORDER — OXYCODONE HCL 5 MG PO TABS: 5.0000 mg | ORAL_TABLET | Freq: Once | ORAL | Status: DC | PRN

## 2016-09-06 MED ORDER — CHLORHEXIDINE GLUCONATE 4 % EX LIQD: 60.0000 mL | Freq: Once | CUTANEOUS | Status: DC

## 2016-09-06 MED ORDER — ONDANSETRON HCL 4 MG/2ML IJ SOLN
INTRAMUSCULAR | Status: DC | PRN
  Administered 2016-09-06: 4 mg via INTRAVENOUS

## 2016-09-06 MED ORDER — DOCUSATE SODIUM 100 MG PO CAPS: 100.0000 mg | ORAL_CAPSULE | Freq: Two times a day (BID) | ORAL | 0 refills | Status: DC

## 2016-09-06 MED ORDER — DEXAMETHASONE SODIUM PHOSPHATE 10 MG/ML IJ SOLN
INTRAMUSCULAR | Status: DC | PRN
  Administered 2016-09-06: 10 mg via INTRAVENOUS

## 2016-09-06 MED ORDER — CEFAZOLIN SODIUM-DEXTROSE 2-4 GM/100ML-% IV SOLN
2.0000 g | INTRAVENOUS | Status: AC
  Administered 2016-09-06: 2 g via INTRAVENOUS

## 2016-09-06 SURGICAL SUPPLY — 79 items
ANCH SUT 1 SHRT SM RGD INSRTR (Anchor) ×2 IMPLANT
ANCHOR SUT 1.45 SZ 1 SHORT (Anchor) ×2 IMPLANT
BANDAGE ACE 4X5 VEL STRL LF (GAUZE/BANDAGES/DRESSINGS) ×2 IMPLANT
BANDAGE ESMARK 6X9 LF (GAUZE/BANDAGES/DRESSINGS) ×1 IMPLANT
BLADE AVERAGE 25X9 (BLADE) IMPLANT
BLADE SURG 15 STRL LF DISP TIS (BLADE) ×4 IMPLANT
BLADE SURG 15 STRL SS (BLADE) ×6
BNDG CMPR 9X4 STRL LF SNTH (GAUZE/BANDAGES/DRESSINGS)
BNDG CMPR 9X6 STRL LF SNTH (GAUZE/BANDAGES/DRESSINGS)
BNDG COHESIVE 4X5 TAN STRL (GAUZE/BANDAGES/DRESSINGS) ×2 IMPLANT
BNDG COHESIVE 6X5 TAN STRL LF (GAUZE/BANDAGES/DRESSINGS) ×2 IMPLANT
BNDG ESMARK 4X9 LF (GAUZE/BANDAGES/DRESSINGS) IMPLANT
BNDG ESMARK 6X9 LF (GAUZE/BANDAGES/DRESSINGS)
BOOT STEPPER DURA MED (SOFTGOODS) ×2 IMPLANT
CHLORAPREP W/TINT 26ML (MISCELLANEOUS) ×3 IMPLANT
COVER BACK TABLE 60X90IN (DRAPES) ×3 IMPLANT
CUFF TOURNIQUET SINGLE 34IN LL (TOURNIQUET CUFF) ×3 IMPLANT
DECANTER SPIKE VIAL GLASS SM (MISCELLANEOUS) IMPLANT
DRAPE EXTREMITY T 121X128X90 (DRAPE) ×3 IMPLANT
DRAPE OEC MINIVIEW 54X84 (DRAPES) IMPLANT
DRAPE U-SHAPE 47X51 STRL (DRAPES) ×2 IMPLANT
DRSG MEPITEL 4X7.2 (GAUZE/BANDAGES/DRESSINGS) ×2 IMPLANT
DRSG PAD ABDOMINAL 8X10 ST (GAUZE/BANDAGES/DRESSINGS) ×6 IMPLANT
ELECT REM PT RETURN 9FT ADLT (ELECTROSURGICAL) ×3
ELECTRODE REM PT RTRN 9FT ADLT (ELECTROSURGICAL) ×2 IMPLANT
GAUZE SPONGE 4X4 12PLY STRL (GAUZE/BANDAGES/DRESSINGS) ×5 IMPLANT
GLOVE BIO SURGEON STRL SZ8 (GLOVE) ×3 IMPLANT
GLOVE BIOGEL PI IND STRL 7.0 (GLOVE) ×2 IMPLANT
GLOVE BIOGEL PI IND STRL 8 (GLOVE) ×4 IMPLANT
GLOVE BIOGEL PI INDICATOR 7.0 (GLOVE) ×2
GLOVE BIOGEL PI INDICATOR 8 (GLOVE) ×2
GLOVE ECLIPSE 6.5 STRL STRAW (GLOVE) ×2 IMPLANT
GLOVE ECLIPSE 8.0 STRL XLNG CF (GLOVE) ×3 IMPLANT
GOWN STRL REUS W/ TWL LRG LVL3 (GOWN DISPOSABLE) ×2 IMPLANT
GOWN STRL REUS W/ TWL XL LVL3 (GOWN DISPOSABLE) ×4 IMPLANT
GOWN STRL REUS W/TWL LRG LVL3 (GOWN DISPOSABLE) ×3
GOWN STRL REUS W/TWL XL LVL3 (GOWN DISPOSABLE) ×6
K-WIRE .062X4 (WIRE) ×2 IMPLANT
KIT BIO-TENODESIS 3X8 DISP (MISCELLANEOUS)
KIT INSRT BABSR STRL DISP BTN (MISCELLANEOUS) IMPLANT
NDL SUT 6 .5 CRC .975X.05 MAYO (NEEDLE) IMPLANT
NEEDLE HYPO 22GX1.5 SAFETY (NEEDLE) ×2 IMPLANT
NEEDLE MAYO TAPER (NEEDLE)
NS IRRIG 1000ML POUR BTL (IV SOLUTION) ×3 IMPLANT
PACK BASIN DAY SURGERY FS (CUSTOM PROCEDURE TRAY) ×3 IMPLANT
PAD CAST 4YDX4 CTTN HI CHSV (CAST SUPPLIES) ×2 IMPLANT
PADDING CAST ABS 4INX4YD NS (CAST SUPPLIES)
PADDING CAST ABS COTTON 4X4 ST (CAST SUPPLIES) IMPLANT
PADDING CAST COTTON 4X4 STRL (CAST SUPPLIES) ×3
PADDING CAST COTTON 6X4 STRL (CAST SUPPLIES) ×3 IMPLANT
PASSER SUT SWANSON 36MM LOOP (INSTRUMENTS) IMPLANT
PENCIL BUTTON HOLSTER BLD 10FT (ELECTRODE) ×3 IMPLANT
RETRIEVER SUT HEWSON (MISCELLANEOUS) IMPLANT
SANITIZER HAND PURELL 535ML FO (MISCELLANEOUS) ×3 IMPLANT
SCOTCHCAST PLUS 4X4 WHITE (CAST SUPPLIES) IMPLANT
SHEET MEDIUM DRAPE 40X70 STRL (DRAPES) ×3 IMPLANT
SLEEVE SCD COMPRESS KNEE MED (MISCELLANEOUS) ×3 IMPLANT
SPLINT FAST PLASTER 5X30 (CAST SUPPLIES) ×20
SPLINT PLASTER CAST FAST 5X30 (CAST SUPPLIES) ×20 IMPLANT
SPONGE LAP 18X18 X RAY DECT (DISPOSABLE) ×3 IMPLANT
STOCKINETTE 6  STRL (DRAPES) ×1
STOCKINETTE 6 STRL (DRAPES) ×2 IMPLANT
SUCTION FRAZIER HANDLE 10FR (MISCELLANEOUS) ×1
SUCTION TUBE FRAZIER 10FR DISP (MISCELLANEOUS) ×1 IMPLANT
SUT ETHIBOND 0 MO6 C/R (SUTURE) IMPLANT
SUT ETHIBOND 2 OS 4 DA (SUTURE) IMPLANT
SUT ETHILON 3 0 PS 1 (SUTURE) ×3 IMPLANT
SUT FIBERWIRE 2-0 18 17.9 3/8 (SUTURE)
SUT MERSILENE 2.0 SH NDLE (SUTURE) IMPLANT
SUT MNCRL AB 3-0 PS2 18 (SUTURE) ×3 IMPLANT
SUT VIC AB 0 SH 27 (SUTURE) ×2 IMPLANT
SUT VIC AB 2-0 SH 27 (SUTURE)
SUT VIC AB 2-0 SH 27XBRD (SUTURE) IMPLANT
SUTURE FIBERWR 2-0 18 17.9 3/8 (SUTURE) IMPLANT
SYR BULB 3OZ (MISCELLANEOUS) ×3 IMPLANT
SYR CONTROL 10ML LL (SYRINGE) ×2 IMPLANT
TOWEL OR 17X24 6PK STRL BLUE (TOWEL DISPOSABLE) ×3 IMPLANT
TUBE CONNECTING 20X1/4 (TUBING) ×2 IMPLANT
UNDERPAD 30X30 (UNDERPADS AND DIAPERS) ×3 IMPLANT

## 2016-09-06 NOTE — Op Note (Signed)
09/06/2016  8:30 AM  PATIENT:  Sarah Wong  51 y.o. female  PRE-OPERATIVE DIAGNOSIS:  1.  Left peroneus brevis tendon tear                                                             2.  Left peroneus longus tenosynovitis  POST-OPERATIVE DIAGNOSIS:  same  Procedure(s): 1.  Left peroneus longus tenolysis 2.  Left peroneus brevis tenolysis and tendon repair  SURGEON:  [name], MD  ASSISTANT: Mechele Claude, PA-C  ANESTHESIA:   General  EBL:  minimal   TOURNIQUET:   Total Tourniquet Time Documented: Thigh (Left) - 31 minutes Total: Thigh (Left) - 31 minutes  COMPLICATIONS:  None apparent  DISPOSITION:  Extubated, awake and stable to recovery.  INDICATION FOR PROCEDURE:  The patient is a 51 year old woman with a long history of left lateral hindfoot pain. She has signs and symptoms of peroneal tendinitis and an MRI shows generative changes at the insertion of the peroneus brevis at the base of the fifth metatarsal. She has failed nonoperative treatment to date including activity modification, oral anti-inflammatories, shoewear modification and physical therapy. She presents today for operative treatment of this painful condition.  The risks and benefits of the alternative treatment options have been discussed in detail.  The patient wishes to proceed with surgery and specifically understands risks of bleeding, infection, nerve damage, blood clots, need for additional surgery, amputation and death.  PROCEDURE IN DETAIL:After pre operative consent was obtained, and the correct operative site was identified, the patient was brought to the operating room and placed supine on the OR table.  Anesthesia was administered.  Pre-operative antibiotics were administered.  A surgical timeout was taken. The left lower extremity was prepped and draped in standard sterile fashion with a tourniquet around the thigh. The extremity was exsanguinated and the tourniquet was inflated to  250 mmHg. A curvilinear incision was then made along the lateral aspect of the hindfoot along the course of the peroneal tendons. Dissection was carried down through the skin and subtenon's tissue. Care was taken to protect branches of the sural nerve. The peroneal tendon sheath was identified. It was incised and released proximally. The superior peroneal retinaculum was taken down from the posterior aspect of the fibula in subperiosteal fashion. The base of the fifth metatarsal was exposed with the insertion of the peroneus brevis tendon. Significant tenosynovitis was noted along the peroneus longus and brevis tendons. This was sharply excised with scissors and scalpel. The brevis was noted to have a low-lying muscle belly and this was abraded as well. The insertion of the brevis was noted to have degenerative change. This was debrided. A  0.0625 K wire was inserted into the cortical bone around the insertion point and several locations to stimulate healing. The insertion point was further debrided with a rongeur and curet. A juggernaut anchor was inserted. The brevis tendon was repaired to the base of the fifth metatarsal with horizontal mattress suture. Wound was irrigated copiously. The superior peroneal retinaculum was repaired to the fibula through drill holes with 0 Vicryl suture. The subtenon's tissues were approximated with 3-0 Monocryl the skin incision was closed running 3-0 nylon. Half percent Marcaine with epinephrine was infiltrated in the subtenon's tissues around the incision for postoperative pain  control. Sterile dressings were applied followed by compression wrap and a Cam Walker boot. Patient was awakened from anesthesia and transported to the recovery room in stable condition. Tourniquet was released after application of the dressings at 31 minutes.   FOLLOW UP PLAN: Weightbearing as tolerated on the left foot in a Cam Walker boot. Follow-up with me in the office in 2 weeks for suture  removal.    Mechele Claude PA-C was present and scrubbed for the duration of the operative case. His assistance assistance was essential in positioning the patient, prepping and draping, gaining maintaining exposure, performing the operation, closing and dressing the wounds and applying the splint.

## 2016-09-06 NOTE — Anesthesia Preprocedure Evaluation (Signed)
Anesthesia Evaluation  Patient identified by MRN, date of birth, ID band Patient awake    Reviewed: Allergy & Precautions, H&P , NPO status , Patient's Chart, lab work & pertinent test results, reviewed documented beta blocker date and time   Airway Mallampati: II  TM Distance: >3 FB Neck ROM: Full    Dental no notable dental hx.    Pulmonary sleep apnea ,  mild   Pulmonary exam normal breath sounds clear to auscultation       Cardiovascular hypertension, Pt. on medications and Pt. on home beta blockers Normal cardiovascular exam Rhythm:Regular Rate:Normal     Neuro/Psych negative neurological ROS  negative psych ROS   GI/Hepatic Neg liver ROS, GERD  Medicated,  Endo/Other  negative endocrine ROS  Renal/GU negative Renal ROS  negative genitourinary   Musculoskeletal negative musculoskeletal ROS (+)   Abdominal (+) + obese,   Peds negative pediatric ROS (+)  Hematology negative hematology ROS (+)   Anesthesia Other Findings   Reproductive/Obstetrics negative OB ROS                             Anesthesia Physical  Anesthesia Plan  ASA: II  Anesthesia Plan: General   Post-op Pain Management:    Induction: Intravenous  PONV Risk Score and Plan: 3 and Ondansetron, Midazolam and Dexamethasone  Airway Management Planned: Oral ETT  Additional Equipment:   Intra-op Plan:   Post-operative Plan: Extubation in OR  Informed Consent: I have reviewed the patients History and Physical, chart, labs and discussed the procedure including the risks, benefits and alternatives for the proposed anesthesia with the patient or authorized representative who has indicated his/her understanding and acceptance.   Dental advisory given  Plan Discussed with: CRNA and Surgeon  Anesthesia Plan Comments:         Anesthesia Quick Evaluation

## 2016-09-06 NOTE — Transfer of Care (Signed)
Immediate Anesthesia Transfer of Care Note  Patient: Sarah Wong  Procedure(s) Performed: Procedure(s): Tenolysis of left peroneal tendons with repair of peroneus brevus (Left)  Patient Location: PACU  Anesthesia Type:General  Level of Consciousness: awake and patient cooperative  Airway & Oxygen Therapy: Patient Spontanous Breathing and Patient connected to face mask oxygen  Post-op Assessment: Report given to RN and Post -op Vital signs reviewed and stable  Post vital signs: Reviewed and stable  Last Vitals:  Vitals:   09/06/16 0631  BP: 137/81  Pulse: (!) 57  Resp: 18  Temp: 36.4 C  SpO2: 100%    Last Pain:  Vitals:   09/06/16 0631  TempSrc: Oral  PainSc: 6       Patients Stated Pain Goal: 4 (38/46/65 [id number])  Complications: No apparent anesthesia complications

## 2016-09-06 NOTE — Anesthesia Postprocedure Evaluation (Signed)
Anesthesia Post Note  Patient: Sarah Wong  Procedure(s) Performed: Procedure(s) (LRB): Tenolysis of left peroneal tendons with repair of peroneus brevus (Left)     Patient location during evaluation: PACU Anesthesia Type: General Level of consciousness: awake and alert Pain management: pain level controlled Vital Signs Assessment: post-procedure vital signs reviewed and stable Respiratory status: spontaneous breathing, nonlabored ventilation and respiratory function stable Cardiovascular status: blood pressure returned to baseline and stable Postop Assessment: no signs of nausea or vomiting Anesthetic complications: no    Last Vitals:  Vitals:   09/06/16 0915 09/06/16 0930  BP: 122/75   Pulse: (!) 58 (!) 55  Resp: (!) 8 (!) 8  Temp:    SpO2: 97% 98%    Last Pain:  Vitals:   09/06/16 0930  TempSrc:   PainSc: 0-No pain    LLE Motor Response: Purposeful movement (09/06/16 0930) LLE Sensation: Full sensation;No pain (09/06/16 0930)          Lynda Rainwater

## 2016-09-06 NOTE — H&P (Signed)
Sarah Wong is an 51 y.o. female.   Chief Complaint: left ankle pain HPI: 51 y/o female with chronic left ankle pain.  MRI and PE indicate a peroneal tendon tear.  She has failed non op treatment and presents for surgery.  Past Medical History:  Diagnosis Date  . GERD (gastroesophageal reflux disease)   . History of kidney stones   . HTN (hypertension)   . Hyperlipidemia   . Mild obstructive sleep apnea    PER STUDY 07-07-2010--  MILD OSA/  NO CPAP RX    . MVP (mitral valve prolapse)   . Renal calculus, bilateral    NON-OBSTRUTIVE  . Right ureteral stone   . Splenic flexure syndrome   . Wears glasses     Past Surgical History:  Procedure Laterality Date  . CARDIOVASCULAR STRESS TEST  11-08-2009  DR Rollene Fare   NORMAL PERFUSION STUDY/ NO ISCHEMIA/ EF 63%  . CYSTOSCOPY W/ URETERAL STENT PLACEMENT  05/21/2011   Procedure: CYSTOSCOPY WITH RETROGRADE PYELOGRAM/URETERAL STENT PLACEMENT;  Surgeon: Malka So, MD;  Location: [facility];  Service: Urology;  Laterality: Left;  . CYSTOSCOPY WITH STENT PLACEMENT Right 11/20/2012   Procedure: CYSTOSCOPY WITH STENT PLACEMENT;  Surgeon: Irine Seal, MD;  Location: [facility];  Service: Urology;  Laterality: Right;  . CYSTOSCOPY WITH URETEROSCOPY, STONE BASKETRY AND STENT PLACEMENT Right 11/20/2012   Procedure: RIGHT URETEROSCOPY,BALLOON DILITATION, STONE EXTRACTION  ;  Surgeon: Irine Seal, MD;  Location: [place];  Service: Urology;  Laterality: Right;  . EXTRACORPOREAL SHOCK WAVE LITHOTRIPSY  LEFT 05-31-2011/   RIGHT 10-30-2012  . HOLMIUM LASER APPLICATION Right 40/98/1191   Procedure: HOLMIUM LASER APPLICATION;  Surgeon: Irine Seal, MD;  Location: [facility];  Service: Urology;  Laterality: Right;  . NASAL SEPTUM SURGERY  DEC 2013  . RIGHT WRIST EXPLORATION/ CAPSULOTOMY/ TENOLYSIS  03-30-2002  . TRANSTHORACIC ECHOCARDIOGRAM  03-27-2012   NORMAL LVF/  EF 55-60%/  NO EVIDENCE MVP  . VAGINAL  HYSTERECTOMY  1999  . WRIST GANGLION EXCISION Right 11-14-2001    Family History  Problem Relation Age of Onset  . Hypertension Mother   . Diabetes Mother   . COPD Mother   . Heart disease Mother 49       "aortic replacement" CABG.  Died age 14  . Hypertension Father   . Heart failure Father        open heart surgery  . Stroke Maternal Grandmother    Social History:  reports that she has never smoked. She has never used smokeless tobacco. She reports that she does not drink alcohol or use drugs.  Allergies:  Allergies  Allergen Reactions  . Penicillins Hives and Rash  . Vancomycin Swelling    Other reaction(s): Redness  . Iohexol Hives     Code: HIVES, Desc: HIVES WITH IV CONTRAST MEDIA- PT REQUIRES 13 HR PRE-MEDS, Onset Date: [id number]     Medications Prior to Admission  Medication Sig Dispense Refill  . allopurinol (ZYLOPRIM) 300 MG tablet Take 300 mg by mouth daily.    Marland Kitchen diltiazem (DILACOR XR) 180 MG 24 hr capsule Take 1 capsule (180 mg total) by mouth every morning. 90 capsule 3  . furosemide (LASIX) 20 MG tablet Take 1 tablet (20 mg total) by mouth daily as needed. Take 1/2 to 1 tablets daily as needed for swelling 90 tablet 3  . metoprolol succinate (TOPROL-XL) 25 MG 24 hr tablet Take 1 tablet (25 mg total) by mouth daily.  90 tablet 3  . pantoprazole (PROTONIX) 40 MG tablet Take 1 tablet (40 mg total) by mouth 2 (two) times daily. 180 tablet 3  . Potassium Citrate 15 MEQ (1620 MG) TBCR Take 15 mEq by mouth 3 (three) times daily.      No results found for this or any previous visit (from the past 48 hour(s)). No results found.  ROS no recent f/c/n/v/wt loss.  Blood pressure 137/81, pulse (!) 57, temperature 97.6 F (36.4 C), temperature source Oral, resp. rate 18, height 5' (1.524 m), weight 80.5 kg (177 lb 6 oz), SpO2 100 %. Physical Exam  wn wd woman in nad.  A and ox 4.  Mood and affect normal.  EOMI.  resp unlabored.  L ankle ttp along course of peroneals.  Sens  to LT intact at the sural n dist.  No lymphadenopathy.  5/5 strength in PF and DF of the ankle and toes.    Assessment/Plan L ankle peroneal tendonitis and peroneal tendon tear.  To OR for tenolysis and / or repair of tendon tear.  The risks and benefits of the alternative treatment options have been discussed in detail.  The patient wishes to proceed with surgery and specifically understands risks of bleeding, infection, nerve damage, blood clots, need for additional surgery, amputation and death.   [name], MD 21-Sep-2016, 7:27 AM

## 2016-09-06 NOTE — Brief Op Note (Signed)
09/06/2016  8:30 AM  PATIENT:  Sarah Wong  51 y.o. female  PRE-OPERATIVE DIAGNOSIS:  1.  Left peroneus brevis tendon tear      2.  Left peroneus longus tenosynovitis  POST-OPERATIVE DIAGNOSIS:  same  Procedure(s): 1.  Left peroneus longus tenolysis 2.  Left peroneus brevis tenolysis and tendon repair  SURGEON:  [name], MD  ASSISTANT: Mechele Claude, PA-C  ANESTHESIA:   General  EBL:  minimal   TOURNIQUET:   Total Tourniquet Time Documented: Thigh (Left) - 31 minutes Total: Thigh (Left) - 31 minutes  COMPLICATIONS:  None apparent  DISPOSITION:  Extubated, awake and stable to recovery.  INDICATION FOR PROCEDURE:  The patient is a Sarah Wong with a long history of left lateral hindfoot pain. She has signs and symptoms of peroneal tendinitis and an MRI shows generative changes at the insertion of the peroneus brevis at the base of the fifth metatarsal. She has failed nonoperative treatment to date including activity modification, oral anti-inflammatories, shoewear modification and physical therapy. She presents today for operative treatment of this painful condition.  The risks and benefits of the alternative treatment options have been discussed in detail.  The patient wishes to proceed with surgery and specifically understands risks of bleeding, infection, nerve damage, blood clots, need for additional surgery, amputation and death.  PROCEDURE IN DETAIL:After pre operative consent was obtained, and the correct operative site was identified, the patient was brought to the operating room and placed supine on the OR table.  Anesthesia was administered.  Pre-operative antibiotics were administered.  A surgical timeout was taken. The left lower extremity was prepped and draped in standard sterile fashion with a tourniquet around the thigh. The extremity was exsanguinated and the tourniquet was inflated to 250 mmHg. A curvilinear incision was then made along the lateral  aspect of the hindfoot along the course of the peroneal tendons. Dissection was carried down through the skin and subtenon's tissue. Care was taken to protect branches of the sural nerve. The peroneal tendon sheath was identified. It was incised and released proximally. The superior peroneal retinaculum was taken down from the posterior aspect of the fibula in subperiosteal fashion. The base of the fifth metatarsal was exposed with the insertion of the peroneus brevis tendon. Significant tenosynovitis was noted along the peroneus longus and brevis tendons. This was sharply excised with scissors and scalpel. The brevis was noted to have a low-lying muscle belly and this was abraded as well. The insertion of the brevis was noted to have degenerative change. This was debrided. A  0.0625 K wire was inserted into the cortical bone around the insertion point and several locations to stimulate healing. The insertion point was further debrided with a rongeur and curet. A juggernaut anchor was inserted. The brevis tendon was repaired to the base of the fifth metatarsal with horizontal mattress suture. Wound was irrigated copiously. The superior peroneal retinaculum was repaired to the fibula through drill holes with 0 Vicryl suture. The subtenon's tissues were approximated with 3-0 Monocryl the skin incision was closed running 3-0 nylon. Half percent Marcaine with epinephrine was infiltrated in the subtenon's tissues around the incision for postoperative pain control. Sterile dressings were applied followed by compression wrap and a Cam Walker boot. Patient was awakened from anesthesia and transported to the recovery room in stable condition. Tourniquet was released after application of the dressings at 31 minutes.   FOLLOW UP PLAN: Weightbearing as tolerated on the left foot in a Cam Walker  boot. Follow-up with me in the office in 2 weeks for suture removal.    Mechele Claude PA-C was present and scrubbed for the  duration of the operative case. His assistance assistance was essential in positioning the patient, prepping and draping, gaining maintaining exposure, performing the operation, closing and dressing the wounds and applying the splint.

## 2016-09-06 NOTE — Anesthesia Procedure Notes (Signed)
Procedure Name: LMA Insertion Date/Time: 09/06/2016 7:39 AM Performed by: Kameela Leipold D Pre-anesthesia Checklist: Patient identified, Emergency Drugs available, Suction available and Patient being monitored Patient Re-evaluated:Patient Re-evaluated prior to induction Oxygen Delivery Method: Circle system utilized Preoxygenation: Pre-oxygenation with 100% oxygen Induction Type: IV induction Ventilation: Mask ventilation without difficulty LMA: LMA inserted LMA Size: 3.0 Number of attempts: 1 Airway Equipment and Method: Bite block Placement Confirmation: positive ETCO2 Tube secured with: Tape Dental Injury: Teeth and Oropharynx as per pre-operative assessment

## 2016-09-06 NOTE — Discharge Instructions (Addendum)
[name], MD [facility]  Please read the following information regarding your care after surgery.  Medications  You only need a prescription for the narcotic pain medicine (ex. oxycodone, Percocet, Norco).  All of the other medicines listed below are available over the counter. X Aleve 2 pills twice a day for the first 3 days after surgery. X acetominophen (Tylenol) 650 mg every 4-6 hours as you need for minor to moderate pain X oxycodone as prescribed for severe pain  Narcotic pain medicine (ex. oxycodone, Percocet, Vicodin) will cause constipation.  To prevent this problem, take the following medicines while you are taking any pain medicine. X docusate sodium (Colace) 100 mg twice a day X senna (Senokot) 2 tablets twice a day   Weight Bearing X Bear weight when you are able on your operated leg or foot in the CAM boot.  Dressing/Cam Boot X Keep your dressing clean and dry.  Dont put anything (coat hanger, pencil, etc) down inside of it.  If it gets damp, use a hair dryer on the cool setting to dry it.  If it gets soaked, call the office to schedule an appointment for a dressing change.  Do not remove the CAM boot until after we see you in the office 2 weeks after surgery.   After your dressing, cast or splint is removed; you may shower, but do not soak or scrub the wound.  Allow the water to run over it, and then gently pat it dry.  Swelling It is normal for you to have swelling where you had surgery.  To reduce swelling and pain, keep your toes above your nose for at least 3 days after surgery.  It may be necessary to keep your foot or leg elevated for several weeks.  If it hurts, it should be elevated.  Follow Up Call my office at [phone] when you are discharged from the hospital or surgery center to schedule an appointment to be seen two weeks after surgery.  Call my office at [phone] if you develop a fever >101.5 F, nausea, vomiting, bleeding from the  surgical site or severe pain.     Post Anesthesia Home Care Instructions  Activity: Get plenty of rest for the remainder of the day. A responsible individual must stay with you for 24 hours following the procedure.  For the next 24 hours, DO NOT: -Drive a car -Paediatric nurse -Drink alcoholic beverages -Take any medication unless instructed by your physician -Make any legal decisions or sign important papers.  Meals: Start with liquid foods such as gelatin or soup. Progress to regular foods as tolerated. Avoid greasy, spicy, heavy foods. If nausea and/or vomiting occur, drink only clear liquids until the nausea and/or vomiting subsides. Call your physician if vomiting continues.  Special Instructions/Symptoms: Your throat may feel dry or sore from the anesthesia or the breathing tube placed in your throat during surgery. If this causes discomfort, gargle with warm salt water. The discomfort should disappear within 24 hours.  If you had a scopolamine patch placed behind your ear for the management of post- operative nausea and/or vomiting:  1. The medication in the patch is effective for 72 hours, after which it should be removed.  Wrap patch in a tissue and discard in the trash. Wash hands thoroughly with soap and water. 2. You may remove the patch earlier than 72 hours if you experience unpleasant side effects which may include dry mouth, dizziness or visual disturbances. 3. Avoid touching the patch. Wash  your hands with soap and water after contact with the patch. °  ° °

## 2016-09-07 ENCOUNTER — Encounter (HOSPITAL_BASED_OUTPATIENT_CLINIC_OR_DEPARTMENT_OTHER): Payer: Self-pay | Admitting: Orthopedic Surgery

## 2016-09-07 DIAGNOSIS — S86192A Other injury of other muscle(s) and tendon(s) of posterior muscle group at lower leg level, left leg, initial encounter: Secondary | ICD-10-CM | POA: Diagnosis not present

## 2016-09-27 DIAGNOSIS — Z1231 Encounter for screening mammogram for malignant neoplasm of breast: Secondary | ICD-10-CM | POA: Diagnosis not present

## 2016-09-27 DIAGNOSIS — Z01419 Encounter for gynecological examination (general) (routine) without abnormal findings: Secondary | ICD-10-CM | POA: Diagnosis not present

## 2016-10-31 DIAGNOSIS — M25672 Stiffness of left ankle, not elsewhere classified: Secondary | ICD-10-CM | POA: Diagnosis not present

## 2016-11-01 HISTORY — PX: HAND SURGERY: SHX662

## 2016-11-02 DIAGNOSIS — M65331 Trigger finger, right middle finger: Secondary | ICD-10-CM | POA: Diagnosis not present

## 2016-11-06 ENCOUNTER — Telehealth: Payer: Self-pay

## 2016-11-06 NOTE — Telephone Encounter (Signed)
      [facility] Pre-operative Risk Assessment    Request for surgical clearance:  1. What type of surgery is being performed? righ middle finger:  Right long finger A1 pulley release  2. When is this surgery scheduled? 11-21-2016   3. Are there any medications that need to be held prior to surgery and how long? Not listed    4. Practice name and name of physician performing surgery?  [facility] Dr Caralyn Guile d/t [facility]  5. What is your office phone and fax number?  [phone] fx [phone]   6. Anesthesia type (None, local, MAC, general) ? General anesthesia   Waylan Rocher 11/06/2016, 10:32 AM  _________________________________________________________________   (provider comments below)

## 2016-11-07 DIAGNOSIS — M25672 Stiffness of left ankle, not elsewhere classified: Secondary | ICD-10-CM | POA: Diagnosis not present

## 2016-11-07 NOTE — Telephone Encounter (Signed)
   Patient recently evaluated by Dr. Percival Spanish on 8/20//2018 and was doing well from a cardiac perspective at that time. Dr. Percival Spanish cleared pt for surgery, although there was no mention of what surgery. Since it has been over 2 months since seen I attempted to contact the patient to assess for any new symptoms since his last office visit but was unable to reach the patient. Left a VM asking for her to call back and ask for the provider covering Pre-operative Clearance.   Daune Perch, AGNP-C [facility] 11/07/2016  2:31 PM Pager: [phone]

## 2016-11-07 NOTE — Telephone Encounter (Signed)
    Chart reviewed as part of pre-operative protocol coverage. Patient was contacted 11/07/2016 in reference to pre-operative risk assessment for pending surgery as outlined below.  Sarah Wong was last seen on 08/20/2016 by Hochrein.  Since that day, Sarah Wong has done well.  She is currently in PT for recovery from foot surgery, walking on treadmill and using stationary bike as well as walking at home without any exertional symptoms. She has no previous CAD. She has taken cardizem for many years for palpitations and has had no recent palpitations.   Therefore, based on ACC/AHA guidelines, RCRI score is 0, the patient would be at acceptable risk for the planned procedure without further cardiovascular testing.   Sarah Perch, NP 11/07/2016, 2:53 PM

## 2016-11-15 DIAGNOSIS — M25672 Stiffness of left ankle, not elsewhere classified: Secondary | ICD-10-CM | POA: Diagnosis not present

## 2016-11-16 DIAGNOSIS — M79672 Pain in left foot: Secondary | ICD-10-CM | POA: Diagnosis not present

## 2016-11-21 DIAGNOSIS — M65331 Trigger finger, right middle finger: Secondary | ICD-10-CM | POA: Diagnosis not present

## 2016-11-21 DIAGNOSIS — M65841 Other synovitis and tenosynovitis, right hand: Secondary | ICD-10-CM | POA: Diagnosis not present

## 2016-11-21 DIAGNOSIS — M67431 Ganglion, right wrist: Secondary | ICD-10-CM | POA: Diagnosis not present

## 2016-11-29 ENCOUNTER — Other Ambulatory Visit: Payer: Self-pay | Admitting: Cardiology

## 2016-11-29 NOTE — Telephone Encounter (Signed)
New message     *STAT* If patient is at the pharmacy, call can be transferred to refill team.   1. Which medications need to be refilled? (please list name of each medication and dose if known) pantoprazole (PROTONIX) 40 MG tablet Take 1 tablet (40 mg total) by mouth 2 (two) times daily.  2. Which pharmacy/location (including street and city if local pharmacy) is medication to be sent to? [facility]  3. Do they need a 30 day or 90 day supply? [place]

## 2016-11-30 MED ORDER — PANTOPRAZOLE SODIUM 40 MG PO TBEC: 40.0000 mg | DELAYED_RELEASE_TABLET | Freq: Two times a day (BID) | ORAL | 2 refills | Status: DC

## 2016-11-30 NOTE — Telephone Encounter (Signed)
Rx(s) sent to pharmacy electronically.  

## 2016-12-14 DIAGNOSIS — M7662 Achilles tendinitis, left leg: Secondary | ICD-10-CM | POA: Diagnosis not present

## 2016-12-14 DIAGNOSIS — M7672 Peroneal tendinitis, left leg: Secondary | ICD-10-CM | POA: Diagnosis not present

## 2016-12-14 DIAGNOSIS — Z09 Encounter for follow-up examination after completed treatment for conditions other than malignant neoplasm: Secondary | ICD-10-CM | POA: Diagnosis not present

## 2017-01-25 DIAGNOSIS — M25571 Pain in right ankle and joints of right foot: Secondary | ICD-10-CM | POA: Diagnosis not present

## 2017-01-25 DIAGNOSIS — S93491A Sprain of other ligament of right ankle, initial encounter: Secondary | ICD-10-CM | POA: Diagnosis not present

## 2017-01-27 DIAGNOSIS — R5383 Other fatigue: Secondary | ICD-10-CM | POA: Diagnosis not present

## 2017-01-27 DIAGNOSIS — J209 Acute bronchitis, unspecified: Secondary | ICD-10-CM | POA: Diagnosis not present

## 2017-01-30 DIAGNOSIS — J209 Acute bronchitis, unspecified: Secondary | ICD-10-CM | POA: Diagnosis not present

## 2017-01-30 DIAGNOSIS — J189 Pneumonia, unspecified organism: Secondary | ICD-10-CM | POA: Diagnosis not present

## 2017-02-22 DIAGNOSIS — R109 Unspecified abdominal pain: Secondary | ICD-10-CM | POA: Diagnosis not present

## 2017-02-22 DIAGNOSIS — N202 Calculus of kidney with calculus of ureter: Secondary | ICD-10-CM | POA: Diagnosis not present

## 2017-03-08 ENCOUNTER — Other Ambulatory Visit: Payer: Self-pay | Admitting: Urology

## 2017-03-08 DIAGNOSIS — N390 Urinary tract infection, site not specified: Secondary | ICD-10-CM | POA: Diagnosis not present

## 2017-03-08 DIAGNOSIS — B952 Enterococcus as the cause of diseases classified elsewhere: Secondary | ICD-10-CM | POA: Diagnosis not present

## 2017-03-08 DIAGNOSIS — N201 Calculus of ureter: Secondary | ICD-10-CM | POA: Diagnosis not present

## 2017-03-08 DIAGNOSIS — R8271 Bacteriuria: Secondary | ICD-10-CM | POA: Diagnosis not present

## 2017-03-11 ENCOUNTER — Encounter (HOSPITAL_COMMUNITY): Payer: Self-pay | Admitting: General Practice

## 2017-03-13 NOTE — H&P (Signed)
I have ureteral stone.  HPI: Sarah Wong is a 52 year-old female established patient who is here for ureteral stone.  The problem is on the right side. She first stated noticing pain on 02/22/2017. This is not her first kidney stone. She is currently having flank pain and nausea. She denies having back pain, groin pain, vomiting, fever, and chills. Pain is occuring on the right side. She has not caught a stone in her urine strainer since her symptoms began.   She has had eswl, ureteral stent, and ureteroscopy for treatment of her stones in the past.     ALLERGIES: Contrast Media Ready-Box MISC - Hives Penicillins - Skin Rash Vancomycin HCl Powder - Other Reaction, skin flushing   MEDICATIONS: Ketorolac Tromethamine 10 mg tablet 1 tablet PO Q 6 H PRN  Metoprolol Succinate 25 mg tablet, extended release 24 hr  DilTIAZem HCl TABS Oral  Estradiol  Furosemide 20 mg tablet  Hydrocodone-Acetaminophen 5 mg-325 mg tablet 1 tablet PO Q 6 H PRN  Oxybutynin Chloride 5 mg tablet 1 tablet PO Daily  Pantoprazole Sodium 40 MG Oral Tablet Delayed Release Oral    GU PSH: Cysto Uretero Lithotripsy - April 19, 2012 Cystoscopy Insert Stent - April 19, 2012, 04-20-11 ESWL - 04/19/12, 20-Apr-2011 Hysterectomy Unilat SO - 20-Apr-2011    NON-GU PSH: Excise Uvula - 04/19/12 Extensive Hand Surgery Foot surgery (unspecified), Left - 09/22/2016 Hand/finger Surgery, Right - 11/22/2016 Nose Surgery (Unspecified) - 04/19/12 Wrist Arthroscopy/surgery        GU PMH: Flank Pain (Acute), Right, Secondary to 5-6 mm right UPJ calculus. Ketorolac 60 mg IM today. Culture urine. No ABX unless culture proven UTI - 02/22/2017 Renal calculus, She has small bilateral non-obstructing stones with a history of hyperuricemia. - 12/07/2015, Bilateral kidney stones, - April 20, 2014 Urinary incontinence, Unspec - 10/05/2015 Abdominal Pain Unspec, Right flank pain - Apr 20, 2014 Dorsalgia, Unspec, Back pain - 2014-04-20 Urge incontinence, Urge incontinence of urine - 2014-04-20 Mixed incontinence, Urge and  stress incontinence - April 20, 2014 Ureteral calculus, Calculus of ureter - 04-20-2014, Ureteral Stone, - 04/19/2012 History of urolithiasis, Nephrolithiasis - 2012/04/19      PMH Notes: Left ankle fracture 9/17   NON-GU PMH: Hyperuricemia, Asymptomatic hyperuricemia - 20-Apr-2014 Encounter for general adult medical examination without abnormal findings, Encounter for preventive health examination - 04-19-2012 Cardiac murmur, unspecified, Murmurs - Apr 19, 2012 Personal history of other diseases of the circulatory system, History of mitral valve prolapse - 04/19/2012 Personal history of other diseases of the digestive system, History of esophageal reflux - 04/19/12 Personal history of other diseases of the nervous system and sense organs, History of sleep apnea - 04/19/12    FAMILY HISTORY: Death - Runs In Family Death In The Family Mother - Runs In Family Diabetes - Mother Disorders Of Blood And Blood-forming Organs - Father Family Health Status Number - Runs In Family Gout - Runs In Family Heart Disease - Runs In Family Hypertension - Mother Nephritis - Father   SOCIAL HISTORY: Marital Status: Married Preferred Language: English; Ethnicity: Not Hispanic Or Latino; Race: White Current Smoking Status: Patient has never smoked.  Does not use smokeless tobacco. Social Drinker.  Does not use drugs. Drinks 4+ caffeinated drinks per day. 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    REVIEW OF SYSTEMS:     GU Review Female:  Patient denies frequent urination, hard to postpone urination, burning /pain with urination, get up at night to urinate, leakage of urine, stream starts and stops, trouble starting your stream, have to strain to urinate, and  being pregnant.    Gastrointestinal (Upper):  Patient reports nausea. Patient denies vomiting and indigestion/ heartburn.    Gastrointestinal (Lower):  Patient denies diarrhea and constipation.    Constitutional:  Patient denies fever, night sweats, weight loss, and fatigue.    Skin:  Patient denies skin  rash/ lesion and itching.    Eyes:  Patient denies blurred vision and double vision.    Ears/ Nose/ Throat:  Patient denies sore throat and sinus problems.    Hematologic/Lymphatic:  Patient denies swollen glands and easy bruising.    Cardiovascular:  Patient denies leg swelling and chest pains.    Respiratory:  Patient denies cough and shortness of breath.    Endocrine:  Patient denies excessive thirst.    Musculoskeletal:  Patient denies back pain and joint pain.    Neurological:  Patient denies headaches and dizziness.    Psychologic:  Patient denies depression and anxiety.    VITAL SIGNS:       03/08/2017 08:46 AM     Weight 173 lb / 78.47 kg     Height 60 in / 152.4 cm     BP 130/78 mmHg     Pulse 47 /min     Temperature 97.9 F / 36.6 C     BMI 33.8 kg/m     MULTI-SYSTEM PHYSICAL EXAMINATION:      Constitutional: Well-nourished. No physical deformities. Normally developed. Good grooming.      Respiratory: Normal breath sounds. No labored breathing, no use of accessory muscles.      Cardiovascular: Regular rate and rhythm. No murmur, no gallop. Normal temperature, normal extremity pulses, no swelling, no varicosities.      Skin: No paleness, no jaundice, no cyanosis. No lesion, no ulcer, no rash.      Neurologic / Psychiatric: Oriented to time, oriented to place, oriented to person. No depression, no anxiety, no agitation.      Gastrointestinal: No mass, no tenderness, no rigidity, non obese abdomen. No CVAT     Musculoskeletal: Normal gait and station of head and neck.            PAST DATA REVIEWED:   Source Of History:  Patient  Records Review:  Previous Patient Records  Urine Test Review:  Urinalysis, Urine Culture   Notes:  02/22/17 urine culture: STREP VIRIDANS   PROCEDURES:    KUB - 19147  A single view of the abdomen is obtained.      No progression of right proximal ureteral stone. It still appears to be at L3. Multiple renal calculi noted w/in left renal  pelvis.     Urinalysis w/Scope - 81001  Dipstick Dipstick Cont'd Micro  Specimen: Voided Bilirubin: Neg WBC/hpf: 0 - 5/hpf  Color: Yellow Ketones: Neg RBC/hpf: 10 - 20/hpf  Appearance: Clear Blood: 3+ Bacteria: Rare (0-9/hpf)  Specific Gravity: 1.020 Protein: Trace Cystals: NS (Not Seen)  pH: 6.5 Urobilinogen: 0.2 Casts: NS (Not Seen)  Glucose: Neg Nitrites: Neg Trichomonas: Not Present   Leukocyte Esterase: Neg Mucous: Present    Epithelial Cells: 6 - 10/hpf    Yeast: NS (Not Seen)    Sperm: Not Present    ASSESSMENT:     ICD-10 Details  1 GU:  Ureteral calculus - N20.1 Right, Stable, Chronic - Recommend proceeding with ESWL at this time since there has been no significant progression of proximal right ureteral stone. Instructed to contract office if she passes stone or has any acute changes ie temp >100.5, intractable pain, or  vomiting. Culture urine. No ABX unless culture proven UTI.    PLAN:   Medications  Refill Meds: Tamsulosin Hcl 0.4 mg capsule 1 capsule PO Q HS #30 1 Refill(s)    Orders  Labs Urine Culture  Document  Letter(s):  Created for Patient: Clinical Summary   Notes:  For observation I described the risks which include but are not limited to: silent renal damage, life-threatening infection, need for emergent surgery,failure to pass stone, and pain.  For ureteroscopy I described the risks which include heart attack, stroke, pulmonary embolus, death, bleeding, infection, damage to contiguous structures, positioning injury, ureteral stricture, ureteral avulsion, ureteral injury, need for ureteral stent, inability to perform ureteroscopy, need for an interval procedure, inability to clear stone burden, stent discomfort and pain.  For shockwave lithotripsy I described the risks which include arrhythmia, kidney contusion, kidney hemorrhage, need for transfusion, back discomfort, flank ecchymosis, flank abrasion, inability to break up stone, inability to pass stone  fragments, Steinstrasse, infection associated with obstructing stones, need for different surgical procedure and possible need for repeat shockwave lithotripsy. The patient is willing to proceed.     After a thorough review of the management options for the patient's condition the patient  elected to proceed with surgical therapy as noted above. We have discussed the potential benefits and risks of the procedure, side effects of the proposed treatment, the likelihood of the patient achieving the goals of the procedure, and any potential problems that might occur during the procedure or recuperation. Informed consent has been obtained.

## 2017-03-14 ENCOUNTER — Ambulatory Visit (HOSPITAL_COMMUNITY): Payer: 59

## 2017-03-14 ENCOUNTER — Ambulatory Visit (HOSPITAL_COMMUNITY)
Admission: RE | Admit: 2017-03-14 | Discharge: 2017-03-14 | Disposition: A | Discharge status: Home / Self Care | Payer: 59 | Source: Ambulatory Visit | Attending: Urology | Admitting: Urology

## 2017-03-14 ENCOUNTER — Encounter (HOSPITAL_COMMUNITY)
Admission: RE | Disposition: A | Discharge status: Home / Self Care | Payer: Self-pay | Source: Ambulatory Visit | Attending: Urology

## 2017-03-14 ENCOUNTER — Other Ambulatory Visit: Payer: Self-pay

## 2017-03-14 ENCOUNTER — Encounter (HOSPITAL_COMMUNITY): Payer: Self-pay | Admitting: *Deleted

## 2017-03-14 DIAGNOSIS — N201 Calculus of ureter: Secondary | ICD-10-CM | POA: Diagnosis not present

## 2017-03-14 DIAGNOSIS — Z79899 Other long term (current) drug therapy: Secondary | ICD-10-CM | POA: Diagnosis not present

## 2017-03-14 DIAGNOSIS — Z79890 Hormone replacement therapy (postmenopausal): Secondary | ICD-10-CM | POA: Insufficient documentation

## 2017-03-14 DIAGNOSIS — K219 Gastro-esophageal reflux disease without esophagitis: Secondary | ICD-10-CM | POA: Diagnosis not present

## 2017-03-14 DIAGNOSIS — I341 Nonrheumatic mitral (valve) prolapse: Secondary | ICD-10-CM | POA: Insufficient documentation

## 2017-03-14 DIAGNOSIS — N200 Calculus of kidney: Secondary | ICD-10-CM | POA: Diagnosis not present

## 2017-03-14 DIAGNOSIS — G4730 Sleep apnea, unspecified: Secondary | ICD-10-CM | POA: Diagnosis not present

## 2017-03-14 HISTORY — PX: EXTRACORPOREAL SHOCK WAVE LITHOTRIPSY: SHX1557

## 2017-03-14 SURGERY — LITHOTRIPSY, ESWL
Anesthesia: LOCAL | Laterality: Right

## 2017-03-14 MED ORDER — DIAZEPAM 5 MG PO TABS
10.0000 mg | ORAL_TABLET | ORAL | Status: AC
  Administered 2017-03-14: 10 mg via ORAL
  Filled 2017-03-14: qty 2

## 2017-03-14 MED ORDER — DIPHENHYDRAMINE HCL 25 MG PO CAPS
25.0000 mg | ORAL_CAPSULE | ORAL | Status: AC
  Administered 2017-03-14: 25 mg via ORAL
  Filled 2017-03-14: qty 1

## 2017-03-14 MED ORDER — CIPROFLOXACIN HCL 500 MG PO TABS
500.0000 mg | ORAL_TABLET | ORAL | Status: AC
  Administered 2017-03-14: 500 mg via ORAL
  Filled 2017-03-14: qty 1

## 2017-03-14 MED ORDER — SODIUM CHLORIDE 0.9 % IV SOLN
INTRAVENOUS | Status: DC
  Administered 2017-03-14: 08:00:00 via INTRAVENOUS

## 2017-03-14 NOTE — Discharge Instructions (Signed)
Lithotripsy, Care After °This sheet gives you information about how to care for yourself after your procedure. Your health care provider may also give you more specific instructions. If you have problems or questions, contact your health care provider. °What can I expect after the procedure? °After the procedure, it is common to have: °· Some blood in your urine. This should only last for a few days. °· Soreness in your back, sides, or upper abdomen for a few days. °· Blotches or bruises on your back where the pressure wave entered the skin. °· Pain, discomfort, or nausea when pieces (fragments) of the kidney stone move through the tube that carries urine from the kidney to the bladder (ureter). Stone fragments may pass soon after the procedure, but they may continue to pass for up to 4-8 weeks. °? If you have severe pain or nausea, contact your health care provider. This may be caused by a large stone that was not broken up, and this may mean that you need more treatment. °· Some pain or discomfort during urination. °· Some pain or discomfort in the lower abdomen or (in men) at the base of the penis. ° °Follow these instructions at home: °Medicines °· Take over-the-counter and prescription medicines only as told by your health care provider. °· If you were prescribed an antibiotic medicine, take it as told by your health care provider. Do not stop taking the antibiotic even if you start to feel better. °· Do not drive for 24 hours if you were given a medicine to help you relax (sedative). °· Do not drive or use heavy machinery while taking prescription pain medicine. °Eating and drinking °· Drink enough water and fluids to keep your urine clear or pale yellow. This helps any remaining pieces of the stone to pass. It can also help prevent new stones from forming. °· Eat plenty of fresh fruits and vegetables. °· Follow instructions from your health care provider about eating and drinking restrictions. You may be  instructed: °? To reduce how much salt (sodium) you eat or drink. Check ingredients and nutrition facts on packaged foods and beverages. °? To reduce how much meat you eat. °· Eat the recommended amount of calcium for your age and gender. Ask your health care provider how much calcium you should have. °General instructions °· Get plenty of rest. °· Most people can resume normal activities 1-2 days after the procedure. Ask your health care provider what activities are safe for you. °· If directed, strain all urine through the strainer that was provided by your health care provider. °? Keep all fragments for your health care provider to see. Any stones that are found may be sent to a medical lab for examination. The stone may be as small as a grain of salt. °· Keep all follow-up visits as told by your health care provider. This is important. °Contact a health care provider if: °· You have pain that is severe or does not get better with medicine. °· You have nausea that is severe or does not go away. °· You have blood in your urine longer than your health care provider told you to expect. °· You have more blood in your urine. °· You have pain during urination that does not go away. °· You urinate more frequently than usual and this does not go away. °· You develop a rash or any other possible signs of an allergic reaction. °Get help right away if: °· You have severe pain in   your back, sides, or upper abdomen. °· You have severe pain while urinating. °· Your urine is very dark red. °· You have blood in your stool (feces). °· You cannot pass any urine at all. °· You feel a strong urge to urinate after emptying your bladder. °· You have a fever or chills. °· You develop shortness of breath, difficulty breathing, or chest pain. °· You have severe nausea that leads to persistent vomiting. °· You faint. °Summary °· After this procedure, it is common to have some pain, discomfort, or nausea when pieces (fragments) of the  kidney stone move through the tube that carries urine from the kidney to the bladder (ureter). If this pain or nausea is severe, however, you should contact your health care provider. °· Most people can resume normal activities 1-2 days after the procedure. Ask your health care provider what activities are safe for you. °· Drink enough water and fluids to keep your urine clear or pale yellow. This helps any remaining pieces of the stone to pass, and it can help prevent new stones from forming. °· If directed, strain your urine and keep all fragments for your health care provider to see. Fragments or stones may be as small as a grain of salt. °· Get help right away if you have severe pain in your back, sides, or upper abdomen or have severe pain while urinating. °This information is not intended to replace advice given to you by your health care provider. Make sure you discuss any questions you have with your health care provider. °Document Released: 01/07/2007 Document Revised: 11/09/2015 Document Reviewed: 11/09/2015 °Elsevier Interactive Patient Education © 2018 Elsevier Inc[employer] °Moderate Conscious Sedation, Adult, Care After °These instructions provide you with information about caring for yourself after your procedure. Your health care provider may also give you more specific instructions. Your treatment has been planned according to current medical practices, but problems sometimes occur. Call your health care provider if you have any problems or questions after your procedure. °What can I expect after the procedure? °After your procedure, it is common: °· To feel sleepy for several hours. °· To feel clumsy and have poor balance for several hours. °· To have poor judgment for several hours. °· To vomit if you eat too soon. ° °Follow these instructions at home: °For at least 24 hours after the procedure: ° °· Do not: °? Participate in activities where you could fall or become injured. °? Drive. °? Use heavy  machinery. °? Drink alcohol. °? Take sleeping pills or medicines that cause drowsiness. °? Make important decisions or sign legal documents. °? Take care of children on your own. °· Rest. °Eating and drinking °· Follow the diet recommended by your health care provider. °· If you vomit: °? Drink water, juice, or soup when you can drink without vomiting. °? Make sure you have little or no nausea before eating solid foods. °General instructions °· Have a responsible adult stay with you until you are awake and alert. °· Take over-the-counter and prescription medicines only as told by your health care provider. °· If you smoke, do not smoke without supervision. °· Keep all follow-up visits as told by your health care provider. This is important. °Contact a health care provider if: °· You keep feeling nauseous or you keep vomiting. °· You feel light-headed. °· You develop a rash. °· You have a fever. °Get help right away if: °· You have trouble breathing. °This information is not intended to replace advice   given to you by your health care provider. Make sure you discuss any questions you have with your health care provider. Document Released: 10/08/2012 Document Revised: 05/23/2015 Document Reviewed: 04/09/2015 Elsevier Interactive Patient Education  [employer].    I have reviewed discharge instructions in detail with the patient. They will follow-up with me or their physician as scheduled. My nurse will also be calling the patients as per protocol.

## 2017-03-14 NOTE — Interval H&P Note (Signed)
History and Physical Interval Note:  03/14/2017 7:18 AM  Sarah Wong  has presented today for surgery, with the diagnosis of RIGHT PROXIMAL URETERAL STONE  The various methods of treatment have been discussed with the patient and family. After consideration of risks, benefits and other options for treatment, the patient has consented to  Procedure(s): RIGHT EXTRACORPOREAL SHOCK WAVE LITHOTRIPSY (ESWL) (Right) as a surgical intervention .  The patient's history has been reviewed, patient examined, no change in status, stable for surgery.  I have reviewed the patient's chart and labs.  Questions were answered to the patient's satisfaction.     Linn Goetze A

## 2017-03-15 ENCOUNTER — Encounter (HOSPITAL_COMMUNITY): Payer: Self-pay | Admitting: Urology

## 2017-03-22 DIAGNOSIS — R8271 Bacteriuria: Secondary | ICD-10-CM | POA: Diagnosis not present

## 2017-03-22 DIAGNOSIS — N201 Calculus of ureter: Secondary | ICD-10-CM | POA: Diagnosis not present

## 2017-04-11 DIAGNOSIS — R350 Frequency of micturition: Secondary | ICD-10-CM | POA: Diagnosis not present

## 2017-06-05 DIAGNOSIS — J410 Simple chronic bronchitis: Secondary | ICD-10-CM | POA: Diagnosis not present

## 2017-06-05 DIAGNOSIS — J322 Chronic ethmoidal sinusitis: Secondary | ICD-10-CM | POA: Diagnosis not present

## 2017-06-05 DIAGNOSIS — J320 Chronic maxillary sinusitis: Secondary | ICD-10-CM | POA: Diagnosis not present

## 2017-06-07 DIAGNOSIS — Z139 Encounter for screening, unspecified: Secondary | ICD-10-CM | POA: Diagnosis not present

## 2017-06-12 ENCOUNTER — Ambulatory Visit (INDEPENDENT_AMBULATORY_CARE_PROVIDER_SITE_OTHER): Payer: 59 | Admitting: Family Medicine

## 2017-06-12 ENCOUNTER — Encounter: Payer: Self-pay | Admitting: Family Medicine

## 2017-06-12 VITALS — BP 124/66 | HR 64 | Temp 97.8°F | Wt 172.9 lb

## 2017-06-12 DIAGNOSIS — Z23 Encounter for immunization: Secondary | ICD-10-CM | POA: Diagnosis not present

## 2017-06-12 DIAGNOSIS — Z7729 Contact with and (suspected ) exposure to other hazardous substances: Secondary | ICD-10-CM
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
## 2017-06-12 NOTE — Patient Instructions (Signed)
Follow up for any fever or persistent diarrhea.

## 2017-07-05 DIAGNOSIS — M25572 Pain in left ankle and joints of left foot: Secondary | ICD-10-CM | POA: Diagnosis not present

## 2017-07-26 ENCOUNTER — Ambulatory Visit (INDEPENDENT_AMBULATORY_CARE_PROVIDER_SITE_OTHER): Payer: 59 | Admitting: Family Medicine

## 2017-07-26 ENCOUNTER — Encounter: Payer: Self-pay | Admitting: Family Medicine

## 2017-07-26 VITALS — BP 120/60 | HR 54 | Temp 98.0°F | Wt 173.3 lb

## 2017-07-26 DIAGNOSIS — B029 Zoster without complications: Secondary | ICD-10-CM

## 2017-07-26 DIAGNOSIS — R221 Localized swelling, mass and lump, neck: Secondary | ICD-10-CM

## 2017-07-26 MED ORDER — VALACYCLOVIR HCL 1 G PO TABS: 1000.0000 mg | ORAL_TABLET | Freq: Three times a day (TID) | ORAL | 0 refills | Status: AC

## 2017-07-26 NOTE — Progress Notes (Signed)
Sarah Wong DOB: 04/02/1965 Encounter date: 07/26/2017  This is a 52 y.o. female who presents with Chief Complaint  Patient presents with  . possible shingles    red spots on the top of the head, noticed them last night, painful and itching, hurts to bruch her hair    History of present illness:  Throbbing sharp pains to top of head. Even wind going over head bothers her. Very painful when using brush over skin this morning. Then used hair spray which was extremely painful. Front of scalp and left upper forehead. Never had similar rash in past. No other exposure that she can think of that would cause pain. Has not tried anything for pain. Made appointment because coworker told her it looked like shingles. Current pain is 6/10.  Also mentions "lump" on left side of neck. Been there for years, but noting enlargement in recent months. Not tender, not red, no drainage. Wanting removal if possible.       Allergies  Allergen Reactions  . Penicillins Hives and Rash  . Vancomycin Swelling    Other reaction(s): Redness  . Iohexol Hives     Code: HIVES, Desc: HIVES WITH IV CONTRAST MEDIA- PT REQUIRES 13 HR PRE-MEDS, Onset Date: [id number]    Current Meds  Medication Sig  . diltiazem (DILACOR XR) 180 MG 24 hr capsule Take 1 capsule (180 mg total) by mouth every morning.  Marland Kitchen estradiol (ESTRACE) 1 MG tablet Take 1 mg by mouth daily.  . furosemide (LASIX) 20 MG tablet Take 1 tablet (20 mg total) by mouth daily as needed. Take 1/2 to 1 tablets daily as needed for swelling (Patient taking differently: Take 20 mg by mouth daily. Take 1/2 to 1 tablets daily as needed for swelling)  . metoprolol succinate (TOPROL-XL) 25 MG 24 hr tablet Take 1 tablet (25 mg total) by mouth daily.  Marland Kitchen oxybutynin (DITROPAN) 5 MG tablet Take 5 mg by mouth 3 (three) times daily.  . pantoprazole (PROTONIX) 40 MG tablet Take 1 tablet (40 mg total) by mouth 2 (two) times daily.  . tamsulosin (FLOMAX) 0.4 MG CAPS capsule  Take 0.4 mg by mouth.    Review of Systems  Constitutional: Negative for chills and fever.  HENT: Positive for postnasal drip and sinus pressure (slight maxillary). Negative for congestion and ear pain.   Eyes: Negative for pain and visual disturbance.  Respiratory: Negative for cough, shortness of breath and wheezing.   Cardiovascular: Negative for chest pain.  Skin: Positive for color change, rash and wound.       See hpi    Objective:  BP 120/60 (BP Location: Left Arm, Patient Position: Sitting, Cuff Size: Normal)   Pulse (!) 54   Temp 98 F (36.7 C) (Oral)   Wt 173 lb 4.8 oz (78.6 kg)   SpO2 97%   BMI 33.85 kg/m   Weight: 173 lb 4.8 oz (78.6 kg)   BP Readings from Last 3 Encounters:  07/26/17 120/60  06/12/17 124/66  03/14/17 129/69   Wt Readings from Last 3 Encounters:  07/26/17 173 lb 4.8 oz (78.6 kg)  06/12/17 172 lb 14.4 oz (78.4 kg)  03/14/17 167 lb (75.8 kg)    Physical Exam  Constitutional: She appears well-developed and well-nourished. No distress.  HENT:  Head: Normocephalic and atraumatic.  Right Ear: Tympanic membrane, external ear and ear canal normal.  Left Ear: Tympanic membrane, external ear and ear canal normal.  Nose: Nose normal.  Slight erythema oropharynx  Eyes:  Pupils are equal, round, and reactive to light. Conjunctivae are normal.  Neck: Neck supple.  There is a somewhat mobile 1-2cm mass left posterior neck.  Cardiovascular: Normal rate, regular rhythm and normal heart sounds. Exam reveals no friction rub.  No murmur heard. Pulmonary/Chest: Effort normal and breath sounds normal. No respiratory distress. She has no wheezes. She has no rales.  Skin:  Anterior scalp nearly midline there are multiple (5) small vesicular lesions; some are ruptured. No noted lesions outside of hair.   Psychiatric: She has a normal mood and affect.    Assessment/Plan  1. Herpes zoster without complication Valtrex as directed. We reviewed dermatome and  possible rash progression as well as possible neuropathic pain. Cautioned to watch for eye involvement and let us know if this occurs. Complete 7 days of valtrex and if rash is not dried up at that point we could extend treatment or recheck. If any worsening let us know. We discussed multiple options for pain control if needed, but we are going to start with either tylenol/motrin and she will let us know if she needs additional options.   2. Neck mass - Ambulatory referral to General Surgery  Return if symptoms worsen or fail to improve.   Micheline Rough, MD

## 2017-07-26 NOTE — Patient Instructions (Signed)

## 2017-07-31 DIAGNOSIS — Z719 Counseling, unspecified: Secondary | ICD-10-CM | POA: Diagnosis not present

## 2017-08-07 DIAGNOSIS — H04123 Dry eye syndrome of bilateral lacrimal glands: Secondary | ICD-10-CM | POA: Diagnosis not present

## 2017-08-07 DIAGNOSIS — H53022 Refractive amblyopia, left eye: Secondary | ICD-10-CM | POA: Diagnosis not present

## 2017-08-09 ENCOUNTER — Other Ambulatory Visit: Payer: Self-pay | Admitting: Surgery

## 2017-08-09 DIAGNOSIS — R221 Localized swelling, mass and lump, neck: Secondary | ICD-10-CM | POA: Diagnosis not present

## 2017-08-10 ENCOUNTER — Other Ambulatory Visit: Payer: Self-pay | Admitting: Cardiology

## 2017-08-19 DIAGNOSIS — E790 Hyperuricemia without signs of inflammatory arthritis and tophaceous disease: Secondary | ICD-10-CM | POA: Diagnosis not present

## 2017-08-19 DIAGNOSIS — N200 Calculus of kidney: Secondary | ICD-10-CM | POA: Diagnosis not present

## 2017-08-19 DIAGNOSIS — R1011 Right upper quadrant pain: Secondary | ICD-10-CM | POA: Diagnosis not present

## 2017-08-20 ENCOUNTER — Ambulatory Visit
Admission: RE | Admit: 2017-08-20 | Discharge: 2017-08-20 | Disposition: A | Discharge status: Home / Self Care | Payer: 59 | Source: Ambulatory Visit | Attending: Surgery | Admitting: Surgery

## 2017-08-20 DIAGNOSIS — R221 Localized swelling, mass and lump, neck: Secondary | ICD-10-CM | POA: Diagnosis not present

## 2017-08-22 ENCOUNTER — Ambulatory Visit: Payer: 59 | Admitting: Cardiology

## 2017-08-29 DIAGNOSIS — Z23 Encounter for immunization: Secondary | ICD-10-CM | POA: Diagnosis not present

## 2017-09-11 DIAGNOSIS — N200 Calculus of kidney: Secondary | ICD-10-CM | POA: Diagnosis not present

## 2017-09-11 DIAGNOSIS — R221 Localized swelling, mass and lump, neck: Secondary | ICD-10-CM | POA: Diagnosis not present

## 2017-09-13 ENCOUNTER — Other Ambulatory Visit (HOSPITAL_COMMUNITY): Payer: Self-pay | Admitting: Otolaryngology

## 2017-09-13 DIAGNOSIS — I889 Nonspecific lymphadenitis, unspecified: Secondary | ICD-10-CM

## 2017-09-14 ENCOUNTER — Other Ambulatory Visit: Payer: Self-pay | Admitting: Cardiology

## 2017-09-16 NOTE — Progress Notes (Signed)
HPI The patient presents for evaluation having previously been seen by Dr. Rollene Fare.  He followed her for many years for her mitral valve prolapse. Echo from 2014 demonstrated no evidence of prolapse and no regurgitation. He has also followed her because of a family history of early onset heart disease.   A CT coronary angiogram from 2018 demonstrated normal coronaries and no evidence of calcium.        Allergies  Allergen Reactions  . Penicillins Hives and Rash  . Vancomycin Swelling    Other reaction(s): Redness  . Iohexol Hives     Code: HIVES, Desc: HIVES WITH IV CONTRAST MEDIA- PT REQUIRES 13 HR PRE-MEDS, Onset Date: [id number]     Current Outpatient Medications  Medication Sig Dispense Refill  . diltiazem (DILT-XR) 180 MG 24 hr capsule Take 1 capsule (180 mg total) by mouth every morning. 90 capsule 3  . estradiol (ESTRACE) 1 MG tablet Take 1 mg by mouth daily.    . furosemide (LASIX) 20 MG tablet Take 1 tablet (20 mg total) by mouth daily as needed. 90 tablet 3  . metoprolol succinate (TOPROL-XL) 25 MG 24 hr tablet Take 1 tablet (25 mg total) by mouth daily. 90 tablet 3  . oxybutynin (DITROPAN) 5 MG tablet Take 5 mg by mouth 3 (three) times daily.    . pantoprazole (PROTONIX) 40 MG tablet Take 1 tablet (40 mg total) by mouth 2 (two) times daily. 180 tablet 3   No current facility-administered medications for this visit.     Past Medical History:  Diagnosis Date  . GERD (gastroesophageal reflux disease)   . History of kidney stones   . HTN (hypertension)   . Hyperlipidemia    history of, no medications now  . Mild obstructive sleep apnea    PER STUDY 07-07-2010--  MILD OSA/  NO CPAP RX    . MVP (mitral valve prolapse)   . Renal calculus, bilateral    NON-OBSTRUTIVE  . Right ureteral stone   . Splenic flexure syndrome   . Wears glasses     Past Surgical History:  Procedure Laterality Date  . CARDIOVASCULAR STRESS TEST  11-08-2009  DR Rollene Fare   NORMAL  PERFUSION STUDY/ NO ISCHEMIA/ EF 63%  . CYSTOSCOPY W/ URETERAL STENT PLACEMENT  05/21/2011   Procedure: CYSTOSCOPY WITH RETROGRADE PYELOGRAM/URETERAL STENT PLACEMENT;  Surgeon: Malka So, MD;  Location: [facility];  Service: Urology;  Laterality: Left;  . CYSTOSCOPY WITH STENT PLACEMENT Right 11/20/2012   Procedure: CYSTOSCOPY WITH STENT PLACEMENT;  Surgeon: Irine Seal, MD;  Location: [facility];  Service: Urology;  Laterality: Right;  . CYSTOSCOPY WITH URETEROSCOPY, STONE BASKETRY AND STENT PLACEMENT Right 11/20/2012   Procedure: RIGHT URETEROSCOPY,BALLOON DILITATION, STONE EXTRACTION  ;  Surgeon: Irine Seal, MD;  Location: [place];  Service: Urology;  Laterality: Right;  . EXTRACORPOREAL SHOCK WAVE LITHOTRIPSY  LEFT 05-31-2011/   RIGHT 10-30-2012  . EXTRACORPOREAL SHOCK WAVE LITHOTRIPSY Right 03/14/2017   Procedure: RIGHT EXTRACORPOREAL SHOCK WAVE LITHOTRIPSY (ESWL);  Surgeon: Bjorn Loser, MD;  Location: [facility];  Service: Urology;  Laterality: Right;  . HOLMIUM LASER APPLICATION Right 75/10/2583   Procedure: HOLMIUM LASER APPLICATION;  Surgeon: Irine Seal, MD;  Location: [facility];  Service: Urology;  Laterality: Right;  . NASAL SEPTUM SURGERY  DEC 2013  . RIGHT WRIST EXPLORATION/ CAPSULOTOMY/ TENOLYSIS  03-30-2002  . TENDON REPAIR Left 09/06/2016   Procedure: Tenolysis of left peroneal tendons with repair of  peroneus brevus;  Surgeon: [name], MD;  Location: [place];  Service: Orthopedics;  Laterality: Left;  . TRANSTHORACIC ECHOCARDIOGRAM  03-27-2012   NORMAL LVF/  EF 55-60%/  NO EVIDENCE MVP  . TUBAL LIGATION  1994  . VAGINAL HYSTERECTOMY  1999  . WRIST GANGLION EXCISION Right 11-14-2001     ROS:  As stated in the HPI and negative for all other systems.  PHYSICAL EXAM BP (!) 151/93   Pulse 61   Ht 5' (1.524 m)   Wt 173 lb 9.6 oz (78.7 kg)   BMI 33.90 kg/m   GENERAL:  Well appearing NECK:  No jugular  venous distention, waveform within normal limits, carotid upstroke brisk and symmetric, no bruits, no thyromegaly LUNGS:  Clear to auscultation bilaterally CHEST:  Unremarkable HEART:  PMI not displaced or sustained,S1 and S2 within normal limits, no S3, no S4, no clicks, no rubs, no murmurs ABD:  Flat, positive bowel sounds normal in frequency in pitch, no bruits, no rebound, no guarding, no midline pulsatile mass, no hepatomegaly, no splenomegaly EXT:  2 plus pulses throughout, mild left greater than right leg edema, no cyanosis no clubbing  EKG: Sinus rhythm, rate 61, axis within normal limits, intervals within normal limits, no acute ST-T wave changes.  ASSESSMENT AND PLAN  DYSPNEA AND CHEST PAIN:   She is not currently complaining of these symptoms.  No further testing is indicated.   EDEMA:    This is mild and chronic and she will continue with the meds as listed.   DYSLIPIDEMIA:  This was stopped.   Her MESA risk is 1.6%.  She has no indication for statin or ASA

## 2017-09-17 ENCOUNTER — Encounter: Payer: Self-pay | Admitting: Cardiology

## 2017-09-17 ENCOUNTER — Ambulatory Visit (INDEPENDENT_AMBULATORY_CARE_PROVIDER_SITE_OTHER): Payer: 59 | Admitting: Cardiology

## 2017-09-17 VITALS — BP 151/93 | HR 61 | Ht 60.0 in | Wt 173.6 lb

## 2017-09-17 DIAGNOSIS — R0600 Dyspnea, unspecified: Secondary | ICD-10-CM

## 2017-09-17 DIAGNOSIS — E785 Hyperlipidemia, unspecified: Secondary | ICD-10-CM | POA: Diagnosis not present

## 2017-09-17 DIAGNOSIS — R0609 Other forms of dyspnea: Secondary | ICD-10-CM | POA: Diagnosis not present

## 2017-09-17 DIAGNOSIS — O368131 Decreased fetal movements, third trimester, fetus 1: Secondary | ICD-10-CM | POA: Diagnosis not present

## 2017-09-17 MED ORDER — FUROSEMIDE 20 MG PO TABS: 20.0000 mg | ORAL_TABLET | Freq: Every day | ORAL | 3 refills | Status: DC | PRN

## 2017-09-17 MED ORDER — METOPROLOL SUCCINATE ER 25 MG PO TB24: 25.0000 mg | ORAL_TABLET | Freq: Every day | ORAL | 3 refills | Status: DC

## 2017-09-17 MED ORDER — PANTOPRAZOLE SODIUM 40 MG PO TBEC: 40.0000 mg | DELAYED_RELEASE_TABLET | Freq: Two times a day (BID) | ORAL | 3 refills | Status: DC

## 2017-09-17 MED ORDER — DILTIAZEM HCL ER 180 MG PO CP24: 180.0000 mg | ORAL_CAPSULE | Freq: Every morning | ORAL | 3 refills | Status: DC

## 2017-09-17 NOTE — Patient Instructions (Signed)
Medication Instructions:  Continue current medications  If you need a refill on your cardiac medications before your next appointment, please call your pharmacy.  Labwork: None Ordered   Testing/Procedures: None Ordered   Follow-Up: Your physician wants you to follow-up in: 1 Year with Rhonda Barrett. You should receive a reminder letter in the mail two months in advance. If you do not receive a letter, please call our office in [phone] to schedule your follow-up appointment.     Thank you for choosing [facility] at [facility]!!

## 2017-09-19 ENCOUNTER — Encounter (HOSPITAL_COMMUNITY): Payer: Self-pay

## 2017-09-19 ENCOUNTER — Ambulatory Visit (HOSPITAL_COMMUNITY)
Admission: RE | Admit: 2017-09-19 | Discharge: 2017-09-19 | Disposition: A | Discharge status: Home / Self Care | Payer: 59 | Source: Ambulatory Visit | Attending: Otolaryngology | Admitting: Otolaryngology

## 2017-09-19 DIAGNOSIS — R221 Localized swelling, mass and lump, neck: Secondary | ICD-10-CM | POA: Diagnosis not present

## 2017-09-19 DIAGNOSIS — I889 Nonspecific lymphadenitis, unspecified: Secondary | ICD-10-CM

## 2017-09-23 DIAGNOSIS — N200 Calculus of kidney: Secondary | ICD-10-CM | POA: Diagnosis not present

## 2017-10-08 DIAGNOSIS — Z1231 Encounter for screening mammogram for malignant neoplasm of breast: Secondary | ICD-10-CM | POA: Diagnosis not present

## 2017-10-08 DIAGNOSIS — Z01419 Encounter for gynecological examination (general) (routine) without abnormal findings: Secondary | ICD-10-CM | POA: Diagnosis not present

## 2017-10-09 DIAGNOSIS — O10013 Pre-existing essential hypertension complicating pregnancy, third trimester: Secondary | ICD-10-CM | POA: Diagnosis not present

## 2017-10-09 DIAGNOSIS — O0993 Supervision of high risk pregnancy, unspecified, third trimester: Secondary | ICD-10-CM | POA: Diagnosis not present

## 2017-10-09 LAB — HM MAMMOGRAPHY

## 2017-10-15 ENCOUNTER — Ambulatory Visit (INDEPENDENT_AMBULATORY_CARE_PROVIDER_SITE_OTHER): Payer: 59 | Admitting: Gastroenterology

## 2017-10-15 ENCOUNTER — Encounter: Payer: Self-pay | Admitting: Gastroenterology

## 2017-10-15 VITALS — BP 122/78 | HR 52 | Ht 60.0 in | Wt 173.2 lb

## 2017-10-15 DIAGNOSIS — K219 Gastro-esophageal reflux disease without esophagitis: Secondary | ICD-10-CM

## 2017-10-15 DIAGNOSIS — O10013 Pre-existing essential hypertension complicating pregnancy, third trimester: Secondary | ICD-10-CM | POA: Diagnosis not present

## 2017-10-15 DIAGNOSIS — O0993 Supervision of high risk pregnancy, unspecified, third trimester: Secondary | ICD-10-CM | POA: Diagnosis not present

## 2017-10-15 DIAGNOSIS — R1011 Right upper quadrant pain: Secondary | ICD-10-CM | POA: Diagnosis not present

## 2017-10-15 MED ORDER — DICYCLOMINE HCL 10 MG PO CAPS: 10.0000 mg | ORAL_CAPSULE | Freq: Three times a day (TID) | ORAL | 11 refills | Status: DC

## 2017-10-15 NOTE — Progress Notes (Signed)
    History of Present Illness: This is a 52 year old female with history of GERD and RUQ pain.  She had same symptoms about 2 years ago.  Previous evaluation in 11/2015 below.  She states the symptoms resolved however they returned about 6 months ago.  Right upper quadrant pain generally exacerbated by meals and occasionally associated with urgent looser stools.  Right upper quadrant pain may be present at other times.  She has had several kidney stones however she relates that pain is back and flank related and is clearly different from her current pain.  Abd [place], 11/2015: 9 mm right kidney stone, otherwise negative HIDA scan 12/2015: Normal, EF=70% EGD 08/1998: Small HH otherwise negative  Current Medications, Allergies, Past Medical History, Past Surgical History, Family History and Social History were reviewed in [employer] record.  Physical Exam: General: Well developed, well nourished, no acute distress Head: Normocephalic and atraumatic Eyes:  sclerae anicteric, EOMI Ears: Normal auditory acuity Mouth: No deformity or lesions Lungs: Clear throughout to auscultation Heart: Regular rate and rhythm; no murmurs, rubs or bruits Abdomen: Soft, non tender and non distended. No masses, hepatosplenomegaly or hernias noted. Normal Bowel sounds Rectal: Musculoskeletal: Symmetrical with no gross deformities  Pulses:  Normal pulses noted Extremities: No clubbing, cyanosis, edema or deformities noted Neurological: Alert oriented x 4, grossly nonfocal Psychological:  Alert and cooperative. Normal mood and affect  Assessment and Recommendations:  1.  Suspected IBS.  Schedule RUQ [place]. Avoid foods that exacerbate symptoms.  Begin dicyclomine 10 mg ac and hs and assess response.  If symptoms do not respond adequately over the next 2 weeks she is advised to call the office for further instruction. REV in 6 weeks.   2.  GERD.  Follow standard antireflux measures.  Continue  pantoprazole 40 mg twice daily.

## 2017-10-15 NOTE — Patient Instructions (Signed)
We have sent the following medications to your pharmacy for you to pick up at your convenience: dicyclomine.  You have been scheduled for an abdominal ultrasound at [facility] (1st floor of hospital) on 10/17/17 at 10:30am. Please arrive 15 minutes prior to your appointment for registration. Make certain not to have anything to eat or drink 6 hours prior to your appointment. Should you need to reschedule your appointment, please contact radiology at [phone]. This test typically takes about 30 minutes to perform.  Normal BMI (Body Mass Index- based on height and weight) is between 19 and 25. Your BMI today is Body mass index is 33.84 kg/m. Marland Kitchen Please consider follow up  regarding your BMI with your Primary Care Provider.  Thank you for choosing me and [facility].  Pricilla Riffle. Dagoberto Ligas., MD., Marval Regal

## 2017-10-17 ENCOUNTER — Other Ambulatory Visit (HOSPITAL_COMMUNITY): Payer: 59

## 2017-10-21 DIAGNOSIS — O0993 Supervision of high risk pregnancy, unspecified, third trimester: Secondary | ICD-10-CM | POA: Diagnosis not present

## 2017-10-22 DIAGNOSIS — O10013 Pre-existing essential hypertension complicating pregnancy, third trimester: Secondary | ICD-10-CM | POA: Diagnosis not present

## 2017-10-23 ENCOUNTER — Ambulatory Visit (HOSPITAL_COMMUNITY)
Admission: RE | Admit: 2017-10-23 | Discharge: 2017-10-23 | Disposition: A | Discharge status: Home / Self Care | Payer: 59 | Source: Ambulatory Visit | Attending: Gastroenterology | Admitting: Gastroenterology

## 2017-10-23 DIAGNOSIS — R1011 Right upper quadrant pain: Secondary | ICD-10-CM

## 2017-10-23 DIAGNOSIS — K7689 Other specified diseases of liver: Secondary | ICD-10-CM | POA: Diagnosis not present

## 2017-10-23 DIAGNOSIS — K760 Fatty (change of) liver, not elsewhere classified: Secondary | ICD-10-CM | POA: Diagnosis not present

## 2017-10-24 ENCOUNTER — Other Ambulatory Visit: Payer: Self-pay

## 2017-10-24 DIAGNOSIS — K760 Fatty (change of) liver, not elsewhere classified: Secondary | ICD-10-CM

## 2017-10-24 DIAGNOSIS — R7309 Other abnormal glucose: Secondary | ICD-10-CM

## 2017-10-29 DIAGNOSIS — O10013 Pre-existing essential hypertension complicating pregnancy, third trimester: Secondary | ICD-10-CM | POA: Diagnosis not present

## 2017-11-01 ENCOUNTER — Other Ambulatory Visit (INDEPENDENT_AMBULATORY_CARE_PROVIDER_SITE_OTHER): Payer: 59

## 2017-11-01 DIAGNOSIS — R7309 Other abnormal glucose: Secondary | ICD-10-CM

## 2017-11-01 DIAGNOSIS — K760 Fatty (change of) liver, not elsewhere classified: Secondary | ICD-10-CM

## 2017-11-01 LAB — CBC WITH DIFFERENTIAL/PLATELET
BASOS ABS: 0 10*3/uL (ref 0.0–0.1)
Basophils Relative: 0.5 % (ref 0.0–3.0)
Eosinophils Absolute: 0.1 10*3/uL (ref 0.0–0.7)
Eosinophils Relative: 1 % (ref 0.0–5.0)
HEMATOCRIT: 40.3 % (ref 36.0–46.0)
HEMOGLOBIN: 13.8 g/dL (ref 12.0–15.0)
LYMPHS PCT: 36 % (ref 12.0–46.0)
Lymphs Abs: 2.7 10*3/uL (ref 0.7–4.0)
MCHC: 34.3 g/dL (ref 30.0–36.0)
MCV: 96.1 fl (ref 78.0–100.0)
MONOS PCT: 5.6 % (ref 3.0–12.0)
Monocytes Absolute: 0.4 10*3/uL (ref 0.1–1.0)
NEUTROS ABS: 4.3 10*3/uL (ref 1.4–7.7)
Neutrophils Relative %: 56.9 % (ref 43.0–77.0)
PLATELETS: 172 10*3/uL (ref 150.0–400.0)
RBC: 4.19 Mil/uL (ref 3.87–5.11)
RDW: 12.8 % (ref 11.5–15.5)
WBC: 7.6 10*3/uL (ref 4.0–10.5)

## 2017-11-01 LAB — COMPREHENSIVE METABOLIC PANEL
ALBUMIN: 4.2 g/dL (ref 3.5–5.2)
ALT: 20 U/L (ref 0–35)
AST: 24 U/L (ref 0–37)
Alkaline Phosphatase: 65 U/L (ref 39–117)
BILIRUBIN TOTAL: 0.5 mg/dL (ref 0.2–1.2)
BUN: 13 mg/dL (ref 6–23)
CALCIUM: 9.2 mg/dL (ref 8.4–10.5)
CHLORIDE: 105 meq/L (ref 96–112)
CO2: 27 mEq/L (ref 19–32)
Creatinine, Ser: 0.88 mg/dL (ref 0.40–1.20)
GFR: 71.05 mL/min (ref >60.00)
Glucose, Bld: 122 mg/dL — ABNORMAL HIGH (ref 70–99)
Potassium: 4 mEq/L (ref 3.5–5.1)
Sodium: 141 mEq/L (ref 135–145)
Total Protein: 7 g/dL (ref 6.0–8.3)

## 2017-11-01 LAB — TSH: TSH: 2.77 u[IU]/mL (ref 0.35–4.50)

## 2017-11-04 ENCOUNTER — Other Ambulatory Visit: Payer: Self-pay

## 2017-11-04 DIAGNOSIS — I10 Essential (primary) hypertension: Secondary | ICD-10-CM | POA: Diagnosis not present

## 2017-11-04 DIAGNOSIS — Z3483 Encounter for supervision of other normal pregnancy, third trimester: Secondary | ICD-10-CM | POA: Diagnosis not present

## 2017-11-04 DIAGNOSIS — K760 Fatty (change of) liver, not elsewhere classified: Secondary | ICD-10-CM

## 2017-11-04 DIAGNOSIS — O0993 Supervision of high risk pregnancy, unspecified, third trimester: Secondary | ICD-10-CM | POA: Diagnosis not present

## 2017-11-04 DIAGNOSIS — R7309 Other abnormal glucose: Secondary | ICD-10-CM

## 2017-11-05 DIAGNOSIS — O10013 Pre-existing essential hypertension complicating pregnancy, third trimester: Secondary | ICD-10-CM | POA: Diagnosis not present

## 2017-11-12 DIAGNOSIS — O10013 Pre-existing essential hypertension complicating pregnancy, third trimester: Secondary | ICD-10-CM | POA: Diagnosis not present

## 2017-11-15 DIAGNOSIS — O164 Unspecified maternal hypertension, complicating childbirth: Secondary | ICD-10-CM | POA: Diagnosis not present

## 2017-11-15 DIAGNOSIS — O10913 Unspecified pre-existing hypertension complicating pregnancy, third trimester: Secondary | ICD-10-CM | POA: Diagnosis not present

## 2017-11-15 DIAGNOSIS — Z3490 Encounter for supervision of normal pregnancy, unspecified, unspecified trimester: Secondary | ICD-10-CM | POA: Diagnosis not present

## 2017-11-26 ENCOUNTER — Ambulatory Visit: Payer: 59 | Admitting: Gastroenterology

## 2017-12-13 DIAGNOSIS — M79672 Pain in left foot: Secondary | ICD-10-CM | POA: Diagnosis not present

## 2018-01-01 HISTORY — PX: ANKLE SURGERY: SHX546

## 2018-01-10 DIAGNOSIS — M25572 Pain in left ankle and joints of left foot: Secondary | ICD-10-CM | POA: Diagnosis not present

## 2018-01-13 DIAGNOSIS — R82991 Hypocitraturia: Secondary | ICD-10-CM | POA: Diagnosis not present

## 2018-01-13 DIAGNOSIS — N200 Calculus of kidney: Secondary | ICD-10-CM | POA: Diagnosis not present

## 2018-02-17 DIAGNOSIS — J3081 Allergic rhinitis due to animal (cat) (dog) hair and dander: Secondary | ICD-10-CM | POA: Diagnosis not present

## 2018-02-17 DIAGNOSIS — J320 Chronic maxillary sinusitis: Secondary | ICD-10-CM | POA: Diagnosis not present

## 2018-02-17 DIAGNOSIS — J301 Allergic rhinitis due to pollen: Secondary | ICD-10-CM | POA: Diagnosis not present

## 2018-03-19 DIAGNOSIS — Z23 Encounter for immunization: Secondary | ICD-10-CM | POA: Diagnosis not present

## 2018-04-15 ENCOUNTER — Other Ambulatory Visit (INDEPENDENT_AMBULATORY_CARE_PROVIDER_SITE_OTHER): Payer: 59

## 2018-04-15 DIAGNOSIS — K760 Fatty (change of) liver, not elsewhere classified: Secondary | ICD-10-CM

## 2018-04-15 DIAGNOSIS — N200 Calculus of kidney: Secondary | ICD-10-CM | POA: Diagnosis not present

## 2018-04-15 DIAGNOSIS — R8279 Other abnormal findings on microbiological examination of urine: Secondary | ICD-10-CM | POA: Diagnosis not present

## 2018-04-15 DIAGNOSIS — R1084 Generalized abdominal pain: Secondary | ICD-10-CM | POA: Diagnosis not present

## 2018-04-15 DIAGNOSIS — B964 Proteus (mirabilis) (morganii) as the cause of diseases classified elsewhere: Secondary | ICD-10-CM | POA: Diagnosis not present

## 2018-04-15 DIAGNOSIS — N390 Urinary tract infection, site not specified: Secondary | ICD-10-CM | POA: Diagnosis not present

## 2018-04-15 LAB — HEPATIC FUNCTION PANEL
ALT: 18 U/L (ref 0–35)
AST: 18 U/L (ref 0–37)
Albumin: 4.1 g/dL (ref 3.5–5.2)
Alkaline Phosphatase: 53 U/L (ref 39–117)
Bilirubin, Direct: 0.1 mg/dL (ref 0.0–0.3)
Total Bilirubin: 0.6 mg/dL (ref 0.2–1.2)
Total Protein: 7.3 g/dL (ref 6.0–8.3)

## 2018-07-30 ENCOUNTER — Telehealth: Payer: Self-pay | Admitting: Physician Assistant

## 2018-07-30 NOTE — Telephone Encounter (Signed)
LVM for patient to call and schedule yearly followup with Rosaria Ferries in September.  The patient wishes a early AM or late PM appointment.

## 2018-07-30 NOTE — Telephone Encounter (Signed)
Follow Up   Patient returning call about scheduling appointment. Attempted to schedule, no slots available under Rosaria Ferries, PA-C. Please give patient a call back to assist with scheduling.

## 2018-09-16 ENCOUNTER — Ambulatory Visit: Payer: 59 | Admitting: Physician Assistant

## 2018-09-28 NOTE — Progress Notes (Signed)
HPI The patient presents for evaluation having previously been seen by Dr. Rollene Fare.  He followed her for many years for her mitral valve prolapse. Echo from 2014 demonstrated no evidence of prolapse and no regurgitation. He has also followed her because of a family history of early onset heart disease.   A CT coronary angiogram from 2018 demonstrated normal coronaries and no evidence of calcium.     Since I last saw her she has done well.  She is having some foot problems.  However, despite this she is still trying to be active.  She has changed her diet and kept her weight down.  The patient denies any new symptoms such as chest discomfort, neck or arm discomfort. There has been no new shortness of breath, PND or orthopnea. There have been no reported palpitations, presyncope or syncope.   She has had some chronic vague intermittent chest discomfort.  However, this is mild and unchanged from previous.  It does not happen with exertion.  Allergies  Allergen Reactions  . Penicillins Hives and Rash  . Vancomycin Swelling    Other reaction(s): Redness  . Iohexol Hives     Code: HIVES, Desc: HIVES WITH IV CONTRAST MEDIA- PT REQUIRES 13 HR PRE-MEDS, Onset Date: [id number]     Current Outpatient Medications  Medication Sig Dispense Refill  . diltiazem (DILT-XR) 180 MG 24 hr capsule Take 1 capsule (180 mg total) by mouth every morning. 90 capsule 3  . estradiol (ESTRACE) 1 MG tablet Take 1 mg by mouth daily.    . furosemide (LASIX) 20 MG tablet Take 1 tablet (20 mg total) by mouth daily as needed. 90 tablet 3  . metoprolol succinate (TOPROL-XL) 25 MG 24 hr tablet Take 1 tablet (25 mg total) by mouth daily. 90 tablet 3  . oxybutynin (DITROPAN) 5 MG tablet Take 5 mg by mouth 3 (three) times daily.    . pantoprazole (PROTONIX) 40 MG tablet Take 1 tablet (40 mg total) by mouth 2 (two) times daily. 180 tablet 3  . potassium citrate (UROCIT-K) 10 MEQ (1080 MG) SR tablet potassium citrate ER 10 mEq  (1,080 mg) tablet,extended release    . XIIDRA 5 % SOLN      No current facility-administered medications for this visit.     Past Medical History:  Diagnosis Date  . GERD (gastroesophageal reflux disease)   . History of kidney stones   . HTN (hypertension)   . Hyperlipidemia    history of, no medications now  . Mild obstructive sleep apnea    PER STUDY 07-07-2010--  MILD OSA/  NO CPAP RX    . MVP (mitral valve prolapse)   . Renal calculus, bilateral    NON-OBSTRUTIVE  . Right ureteral stone   . Splenic flexure syndrome   . Wears glasses     Past Surgical History:  Procedure Laterality Date  . CARDIOVASCULAR STRESS TEST  11-08-2009  DR Rollene Fare   NORMAL PERFUSION STUDY/ NO ISCHEMIA/ EF 63%  . CYSTOSCOPY W/ URETERAL STENT PLACEMENT  05/21/2011   Procedure: CYSTOSCOPY WITH RETROGRADE PYELOGRAM/URETERAL STENT PLACEMENT;  Surgeon: Malka So, MD;  Location: [facility];  Service: Urology;  Laterality: Left;  . CYSTOSCOPY WITH STENT PLACEMENT Right 11/20/2012   Procedure: CYSTOSCOPY WITH STENT PLACEMENT;  Surgeon: Irine Seal, MD;  Location[facility];  Service: Urology;  Laterality: Right;  . CYSTOSCOPY WITH URETEROSCOPY, STONE BASKETRY AND STENT PLACEMENT Right 11/20/2012   Procedure: RIGHT URETEROSCOPY,BALLOON DILITATION, STONE  EXTRACTION  ;  Surgeon: Irine Seal, MD;  Location: [facility];  Service: Urology;  Laterality: Right;  . EXTRACORPOREAL SHOCK WAVE LITHOTRIPSY  LEFT 05-31-2011/   RIGHT 10-30-2012  . EXTRACORPOREAL SHOCK WAVE LITHOTRIPSY Right 03/14/2017   Procedure: RIGHT EXTRACORPOREAL SHOCK WAVE LITHOTRIPSY (ESWL);  Surgeon: Bjorn Loser, MD;  Location: [facility];  Service: Urology;  Laterality: Right;  . HAND SURGERY Right 11/2016   trigger finger  . HOLMIUM LASER APPLICATION Right [id number]   Procedure: HOLMIUM LASER APPLICATION;  Surgeon: Irine Seal, MD;  Location: [facility];  Service: Urology;  Laterality: Right;  .  NASAL SEPTUM SURGERY  DEC 2013  . RIGHT WRIST EXPLORATION/ CAPSULOTOMY/ TENOLYSIS  03-30-2002  . TENDON REPAIR Left 09/06/2016   Procedure: Tenolysis of left peroneal tendons with repair of peroneus brevus;  Surgeon: [name], MD;  Location: [place];  Service: Orthopedics;  Laterality: Left;  . TRANSTHORACIC ECHOCARDIOGRAM  03-27-2012   NORMAL LVF/  EF 55-60%/  NO EVIDENCE MVP  . TUBAL LIGATION  1994  . VAGINAL HYSTERECTOMY  1999  . WRIST GANGLION EXCISION Right 11-14-2001     ROS:  As stated in the HPI and negative for all other systems.  PHYSICAL EXAM BP 130/70   Pulse (!) 40   Ht 5' (1.524 m)   Wt 166 lb 12.8 oz (75.7 kg)   BMI 32.58 kg/m   GENERAL:  Well appearing NECK:  No jugular venous distention, waveform within normal limits, carotid upstroke brisk and symmetric, no bruits, no thyromegaly LUNGS:  Clear to auscultation bilaterally CHEST:  Unremarkable HEART:  PMI not displaced or sustained,S1 and S2 within normal limits, no S3, no S4, no clicks, no rubs, no murmurs ABD:  Flat, positive bowel sounds normal in frequency in pitch, no bruits, no rebound, no guarding, no midline pulsatile mass, no hepatomegaly, no splenomegaly EXT:  2 plus pulses throughout, no edema, no cyanosis no clubbing   EKG: Sinus rhythm, rate 40, axis within normal limits, intervals within normal limits, no acute ST-T wave changes.   Lab Results  Component Value Date   CHOL 204 (H) 09/29/2018   TRIG 158.0 (H) 09/29/2018   HDL 54.20 09/29/2018   LDLCALC 118 (H) 09/29/2018    ASSESSMENT AND PLAN  DYSPNEA AND CHEST PAIN:     Her chest discomfort is mild and vague and unchanged from previous.  She said no evidence of coronary disease.  No change in therapy.   DYSLIPIDEMIA:    Her total cholesterol recently went up.  Her LDL still only 118.  Her HDL is 54.   Her MESA risk is low  She has no indication for statin or ASA  MVP: She was followed for this over the years.  I did not  see evidence of this echo would not suggest this by exam.  No further imaging is indicated.  BRADYCARDIA: She has a slow heart otitis.  No change in therapy.

## 2018-09-29 ENCOUNTER — Encounter: Payer: Self-pay | Admitting: Family Medicine

## 2018-09-29 ENCOUNTER — Other Ambulatory Visit: Payer: Self-pay

## 2018-09-29 ENCOUNTER — Ambulatory Visit (INDEPENDENT_AMBULATORY_CARE_PROVIDER_SITE_OTHER): Payer: 59 | Admitting: Family Medicine

## 2018-09-29 VITALS — BP 122/64 | HR 52 | Temp 97.8°F | Wt 167.8 lb

## 2018-09-29 DIAGNOSIS — Z0000 Encounter for general adult medical examination without abnormal findings: Secondary | ICD-10-CM

## 2018-09-29 LAB — HEPATIC FUNCTION PANEL
ALT: 14 U/L (ref 0–35)
AST: 19 U/L (ref 0–37)
Albumin: 4.3 g/dL (ref 3.5–5.2)
Alkaline Phosphatase: 57 U/L (ref 39–117)
Bilirubin, Direct: 0.1 mg/dL (ref 0.0–0.3)
Total Bilirubin: 0.5 mg/dL (ref 0.2–1.2)
Total Protein: 7.1 g/dL (ref 6.0–8.3)

## 2018-09-29 LAB — LIPID PANEL
Cholesterol: 204 mg/dL — ABNORMAL HIGH (ref 0–200)
HDL: 54.2 mg/dL
LDL Cholesterol: 118 mg/dL — ABNORMAL HIGH (ref 0–99)
NonHDL: 149.61
Total CHOL/HDL Ratio: 4
Triglycerides: 158 mg/dL — ABNORMAL HIGH (ref 0.0–149.0)
VLDL: 31.6 mg/dL (ref 0.0–40.0)

## 2018-09-29 LAB — BASIC METABOLIC PANEL
BUN: 7 mg/dL (ref 6–23)
CO2: 29 mEq/L (ref 19–32)
Calcium: 9.5 mg/dL (ref 8.4–10.5)
Chloride: 104 mEq/L (ref 96–112)
Creatinine, Ser: 0.97 mg/dL (ref 0.40–1.20)
GFR: 59.54 mL/min — ABNORMAL LOW (ref >60.00)
Glucose, Bld: 129 mg/dL — ABNORMAL HIGH (ref 70–99)
Potassium: 4.2 mEq/L (ref 3.5–5.1)
Sodium: 142 mEq/L (ref 135–145)

## 2018-09-29 LAB — CBC WITH DIFFERENTIAL/PLATELET
Basophils Absolute: 0.1 K/uL (ref 0.0–0.1)
Basophils Relative: 0.8 % (ref 0.0–3.0)
Eosinophils Absolute: 0.1 K/uL (ref 0.0–0.7)
Eosinophils Relative: 0.9 % (ref 0.0–5.0)
HCT: 39.5 % (ref 36.0–46.0)
Hemoglobin: 13.2 g/dL (ref 12.0–15.0)
Lymphocytes Relative: 42.4 % (ref 12.0–46.0)
Lymphs Abs: 2.9 K/uL (ref 0.7–4.0)
MCHC: 33.5 g/dL (ref 30.0–36.0)
MCV: 95.8 fl (ref 78.0–100.0)
Monocytes Absolute: 0.3 K/uL (ref 0.1–1.0)
Monocytes Relative: 4 % (ref 3.0–12.0)
Neutro Abs: 3.5 K/uL (ref 1.4–7.7)
Neutrophils Relative %: 51.9 % (ref 43.0–77.0)
Platelets: 192 K/uL (ref 150.0–400.0)
RBC: 4.12 Mil/uL (ref 3.87–5.11)
RDW: 13.3 % (ref 11.5–15.5)
WBC: 6.8 K/uL (ref 4.0–10.5)

## 2018-09-29 LAB — TSH: TSH: 2.03 u[IU]/mL (ref 0.35–4.50)

## 2018-09-29 NOTE — Patient Instructions (Signed)
Go ahead and set up colonoscopy screen  We will call you with labs done today  Continue with regular mammograms .

## 2018-09-29 NOTE — Progress Notes (Signed)
Subjective:     Patient ID: Sarah Wong, female   DOB: February 06, 1965, 53 y.o.   MRN: [id number]  HPI Here for physical exam.  She sees gynecologist yearly and is getting mammograms through their office.  She has had previous hysterectomy.  Her chronic problems include history of mitral valve prolapse, mixed urinary urgency and stress incontinence, and history of kidney stones.  She has seen GI for some recurrent right upper quadrant abdominal pain but has never had a colonoscopy.  No family history of colon cancer.  No history of hepatitis C screening.  She has had recent Shingrix vaccine.  Tetanus up-to-date.  Past Medical History:  Diagnosis Date  . GERD (gastroesophageal reflux disease)   . History of kidney stones   . HTN (hypertension)   . Hyperlipidemia    history of, no medications now  . Mild obstructive sleep apnea    PER STUDY 07-07-2010--  MILD OSA/  NO CPAP RX    . MVP (mitral valve prolapse)   . Renal calculus, bilateral    NON-OBSTRUTIVE  . Right ureteral stone   . Splenic flexure syndrome   . Wears glasses    Past Surgical History:  Procedure Laterality Date  . CARDIOVASCULAR STRESS TEST  11-08-2009  DR Rollene Fare   NORMAL PERFUSION STUDY/ NO ISCHEMIA/ EF 63%  . CYSTOSCOPY W/ URETERAL STENT PLACEMENT  05/21/2011   Procedure: CYSTOSCOPY WITH RETROGRADE PYELOGRAM/URETERAL STENT PLACEMENT;  Surgeon: Malka So, MD;  Location: [facility];  Service: Urology;  Laterality: Left;  . CYSTOSCOPY WITH STENT PLACEMENT Right 11/20/2012   Procedure: CYSTOSCOPY WITH STENT PLACEMENT;  Surgeon: Irine Seal, MD;  Location[facility];  Service: Urology;  Laterality: Right;  . CYSTOSCOPY WITH URETEROSCOPY, STONE BASKETRY AND STENT PLACEMENT Right 11/20/2012   Procedure: RIGHT URETEROSCOPY,BALLOON DILITATION, STONE EXTRACTION  ;  Surgeon: Irine Seal, MD;  Location: [place];  Service: Urology;  Laterality: Right;  . EXTRACORPOREAL SHOCK WAVE LITHOTRIPSY   LEFT 05-31-2011/   RIGHT 10-30-2012  . EXTRACORPOREAL SHOCK WAVE LITHOTRIPSY Right 03/14/2017   Procedure: RIGHT EXTRACORPOREAL SHOCK WAVE LITHOTRIPSY (ESWL);  Surgeon: Bjorn Loser, MD;  Location: [facility];  Service: Urology;  Laterality: Right;  . HAND SURGERY Right 11/2016   trigger finger  . HOLMIUM LASER APPLICATION Right [id number]   Procedure: HOLMIUM LASER APPLICATION;  Surgeon: Irine Seal, MD;  Location: [facility];  Service: Urology;  Laterality: Right;  . NASAL SEPTUM SURGERY  DEC 2013  . RIGHT WRIST EXPLORATION/ CAPSULOTOMY/ TENOLYSIS  03-30-2002  . TENDON REPAIR Left 09/06/2016   Procedure: Tenolysis of left peroneal tendons with repair of peroneus brevus;  Surgeon: [name], MD;  Location: [facility];  Service: Orthopedics;  Laterality: Left;  . TRANSTHORACIC ECHOCARDIOGRAM  03-27-2012   NORMAL LVF/  EF 55-60%/  NO EVIDENCE MVP  . TUBAL LIGATION  1994  . VAGINAL HYSTERECTOMY  1999  . WRIST GANGLION EXCISION Right 11-14-2001    reports that she has never smoked. She has never used smokeless tobacco. She reports that she does not drink alcohol or use drugs. family history includes COPD in her mother; Diabetes in her mother; Heart disease (age of onset: 36) in her mother; Heart failure in her father; Hypertension in her father and mother; Stroke in her maternal grandmother. Allergies  Allergen Reactions  . Penicillins Hives and Rash  . Vancomycin Swelling    Other reaction(s): Redness  . Iohexol Hives     Code: HIVES,  Desc: HIVES WITH IV CONTRAST MEDIA- PT REQUIRES 13 HR PRE-MEDS, Onset Date: [id number]      Review of Systems  Constitutional: Negative for activity change, appetite change, fatigue, fever and unexpected weight change.  HENT: Negative for ear pain, hearing loss, sore throat and trouble swallowing.   Eyes: Negative for visual disturbance.  Respiratory: Negative for cough and shortness of breath.   Cardiovascular: Negative for  chest pain and palpitations.  Gastrointestinal: Positive for abdominal pain. Negative for blood in stool, constipation, nausea and vomiting.  Genitourinary: Negative for dysuria and hematuria.  Musculoskeletal: Negative for arthralgias, back pain and myalgias.  Skin: Negative for rash.  Neurological: Negative for dizziness, syncope and headaches.  Hematological: Negative for adenopathy.  Psychiatric/Behavioral: Negative for confusion and dysphoric mood.       Objective:   Physical Exam Constitutional:      Appearance: She is well-developed.  HENT:     Head: Normocephalic and atraumatic.  Eyes:     Pupils: Pupils are equal, round, and reactive to light.  Neck:     Musculoskeletal: Normal range of motion and neck supple.     Thyroid: No thyromegaly.     Comments: Left posterior neck reveals about 2 x 2 centimeter slightly mobile fatty consistency subcutaneous mass which is been present for several years and suspect lipoma.  No neck adenopathy noted Cardiovascular:     Rate and Rhythm: Normal rate and regular rhythm.     Heart sounds: Normal heart sounds. No murmur.  Pulmonary:     Effort: No respiratory distress.     Breath sounds: Normal breath sounds. No wheezing or rales.  Abdominal:     General: Bowel sounds are normal. There is no distension.     Palpations: Abdomen is soft. There is no mass.     Tenderness: There is no guarding or rebound.     Comments: She has some mild tenderness in palpating right upper quadrant.  No guarding or rebound.  Musculoskeletal: Normal range of motion.  Lymphadenopathy:     Cervical: No cervical adenopathy.  Skin:    Findings: No rash.  Neurological:     Mental Status: She is alert and oriented to person, place, and time.     Cranial Nerves: No cranial nerve deficit.     Deep Tendon Reflexes: Reflexes normal.  Psychiatric:        Behavior: Behavior normal.        Thought Content: Thought content normal.        Judgment: Judgment normal.         Assessment:     Physical exam.  We discussed several health maintenance issues as below    Plan:     -Flu vaccine already given -She recently had Shingrix vaccine -Check hepatitis C antibody -Obtain screening labs -Patient needs colonoscopy.  She will discuss with her gastroenterologist -She will continue regular mammograms through [facility]

## 2018-09-30 ENCOUNTER — Encounter: Payer: Self-pay | Admitting: Cardiology

## 2018-09-30 ENCOUNTER — Ambulatory Visit (INDEPENDENT_AMBULATORY_CARE_PROVIDER_SITE_OTHER): Payer: 59 | Admitting: Cardiology

## 2018-09-30 VITALS — BP 130/70 | HR 40 | Ht 60.0 in | Wt 166.8 lb

## 2018-09-30 DIAGNOSIS — I341 Nonrheumatic mitral (valve) prolapse: Secondary | ICD-10-CM | POA: Diagnosis not present

## 2018-09-30 DIAGNOSIS — R0609 Other forms of dyspnea: Secondary | ICD-10-CM

## 2018-09-30 DIAGNOSIS — R0600 Dyspnea, unspecified: Secondary | ICD-10-CM

## 2018-09-30 DIAGNOSIS — E785 Hyperlipidemia, unspecified: Secondary | ICD-10-CM | POA: Diagnosis not present

## 2018-09-30 DIAGNOSIS — R001 Bradycardia, unspecified: Secondary | ICD-10-CM | POA: Diagnosis not present

## 2018-09-30 LAB — HEPATITIS C ANTIBODY
Hepatitis C Ab: NONREACTIVE
SIGNAL TO CUT-OFF: 0.01 (ref <1.00)

## 2018-09-30 MED ORDER — FUROSEMIDE 20 MG PO TABS: 20.0000 mg | ORAL_TABLET | Freq: Every day | ORAL | 3 refills | Status: DC | PRN

## 2018-09-30 MED ORDER — METOPROLOL SUCCINATE ER 25 MG PO TB24: 25.0000 mg | ORAL_TABLET | Freq: Every day | ORAL | 3 refills | Status: DC

## 2018-09-30 MED ORDER — PANTOPRAZOLE SODIUM 40 MG PO TBEC: 40.0000 mg | DELAYED_RELEASE_TABLET | Freq: Two times a day (BID) | ORAL | 3 refills | Status: DC

## 2018-09-30 MED ORDER — DILTIAZEM HCL ER 180 MG PO CP24: 180.0000 mg | ORAL_CAPSULE | Freq: Every morning | ORAL | 3 refills | Status: DC

## 2018-09-30 NOTE — Addendum Note (Signed)
Addended by: Cain Sieve on: 09/30/2018 09:04 AM   Modules accepted: Orders

## 2018-09-30 NOTE — Patient Instructions (Addendum)
Medication Instructions:  Your physician recommends that you continue on your current medications as directed. Please refer to the Current Medication list given to you today.  If you need a refill on your cardiac medications before your next appointment, please call your pharmacy.   Lab work: Lipids in 6 months  Testing/Procedures: NONE  Follow-Up: At [facility], you and your health needs are our priority.  As part of our continuing mission to provide you with exceptional heart care, we have created designated Provider Care Teams.  These Care Teams include your primary Cardiologist (physician) and Advanced Practice Providers (APPs -  Physician Assistants and Nurse Practitioners) who all work together to provide you with the care you need, when you need it. You will need a follow up appointment in 12 months.  Please call our office 2 months in advance to schedule this appointment.  You may see Dr. Percival Spanish or one of the following Advanced Practice Providers on your designated Care Team:   Rosaria Ferries, PA-C Jory Sims, DNP, ANP

## 2018-10-10 ENCOUNTER — Ambulatory Visit (INDEPENDENT_AMBULATORY_CARE_PROVIDER_SITE_OTHER): Payer: 59 | Admitting: Physician Assistant

## 2018-10-10 ENCOUNTER — Encounter: Payer: Self-pay | Admitting: Physician Assistant

## 2018-10-10 VITALS — BP 110/68 | HR 78 | Temp 97.4°F | Ht 60.0 in | Wt 167.0 lb

## 2018-10-10 DIAGNOSIS — R1011 Right upper quadrant pain: Secondary | ICD-10-CM | POA: Diagnosis not present

## 2018-10-10 DIAGNOSIS — R110 Nausea: Secondary | ICD-10-CM

## 2018-10-10 DIAGNOSIS — Z1211 Encounter for screening for malignant neoplasm of colon: Secondary | ICD-10-CM

## 2018-10-10 DIAGNOSIS — K219 Gastro-esophageal reflux disease without esophagitis: Secondary | ICD-10-CM

## 2018-10-10 DIAGNOSIS — G8929 Other chronic pain: Secondary | ICD-10-CM

## 2018-10-10 MED ORDER — NA SULFATE-K SULFATE-MG SULF 17.5-3.13-1.6 GM/177ML PO SOLN: 1.0000 | Freq: Once | ORAL | 0 refills | Status: AC

## 2018-10-10 MED ORDER — DICYCLOMINE HCL 10 MG PO CAPS: 10.0000 mg | ORAL_CAPSULE | Freq: Two times a day (BID) | ORAL | 3 refills | Status: DC

## 2018-10-10 MED ORDER — METOCLOPRAMIDE HCL 10 MG PO TABS: ORAL_TABLET | ORAL | 0 refills | Status: DC

## 2018-10-10 NOTE — Patient Instructions (Signed)
We have sent the following medications to your pharmacy for you to pick up at your convenience:  Reglan, Bentyl  You have been scheduled for an endoscopy and colonoscopy. Please follow the written instructions given to you at your visit today. Please pick up your prep supplies at the pharmacy within the next 1-3 days. If you use inhalers (even only as needed), please bring them with you on the day of your procedure.

## 2018-10-10 NOTE — Progress Notes (Signed)
Chief Complaint: Abdominal pain  HPI:    Sarah Wong is a 53 year old Caucasian female with a past medical history as listed below including mitral valve disease (03/27/2012 echo with LVEF 55-60%), known to Dr. Fuller Plan, who was referred to me by [name], MD for a complaint of abdominal pain.      10/15/2017 patient saw Dr. Fuller Plan with right upper quadrant abdominal pain.  Described a history of GERD.  This pain is exacerbated by meals and occasionally associated with urgent looser stools.  Previous work-up noted in 2017 with abdominal ultrasound showing a 9 mm right kidney stone otherwise negative, HIDA scan 12/2015 was normal EF equals 70%, EGD 08/1998 and small hiatal hernia and otherwise normal.  At that time suspected IBS.  She is scheduled for right upper quadrant ultrasound and started on dicyclomine 10 mg before meals and at bedtime.  Continued on Pantoprazole 40 mg twice daily for reflux.    10/23/2017 right upper quadrant ultrasound with diffuse increase in liver echogenicity likely due to diffuse hepatic steatosis.  Decreased attenuation near the gallbladder fossa likely due to focal fatty sparing.  No focal liver lesions beyond apparent fatty sparing near the gallbladder fossa evident.    09/29/2018 CBC, BMP, hepatic function panel normal.    Today, the patient presents clinic and tells me that her right upper quadrant pain hjas come back over the past month, it does go away sometimes, but is back and sometimes "is stabbing and other times not".  Tells me it radiates in intensity and at its worst is a 7-8/10.  It is there all day long in some degree.  Associated with nausea.  Food makes it worse, sometimes "I do not even make it through a meal before I feel the pain".  Also still associated with some urgent loose stools, sometimes even just water.    Reflux symptoms are controlled with Pantoprazole 40 mg twice daily but if she misses a dose she will "feel it hard".    Denies fever,  chills, weight loss, anorexia, vomiting or symptoms that awaken her from sleep.  Past Medical History:  Diagnosis Date  . GERD (gastroesophageal reflux disease)   . History of kidney stones   . HTN (hypertension)   . Hyperlipidemia    history of, no medications now  . Mild obstructive sleep apnea    PER STUDY 07-07-2010--  MILD OSA/  NO CPAP RX    . MVP (mitral valve prolapse)   . Renal calculus, bilateral    NON-OBSTRUTIVE  . Right ureteral stone   . Splenic flexure syndrome   . Wears glasses     Past Surgical History:  Procedure Laterality Date  . CARDIOVASCULAR STRESS TEST  11-08-2009  DR Rollene Fare   NORMAL PERFUSION STUDY/ NO ISCHEMIA/ EF 63%  . CYSTOSCOPY W/ URETERAL STENT PLACEMENT  05/21/2011   Procedure: CYSTOSCOPY WITH RETROGRADE PYELOGRAM/URETERAL STENT PLACEMENT;  Surgeon: Malka So, MD;  Location: [facility];  Service: Urology;  Laterality: Left;  . CYSTOSCOPY WITH STENT PLACEMENT Right 11/20/2012   Procedure: CYSTOSCOPY WITH STENT PLACEMENT;  Surgeon: Irine Seal, MD;  Location: [facility];  Service: Urology;  Laterality: Right;  . CYSTOSCOPY WITH URETEROSCOPY, STONE BASKETRY AND STENT PLACEMENT Right 11/20/2012   Procedure: RIGHT URETEROSCOPY,BALLOON DILITATION, STONE EXTRACTION  ;  Surgeon: Irine Seal, MD;  Location: [facility];  Service: Urology;  Laterality: Right;  . EXTRACORPOREAL SHOCK WAVE LITHOTRIPSY  LEFT 05-31-2011/   RIGHT 10-30-2012  .  EXTRACORPOREAL SHOCK WAVE LITHOTRIPSY Right 03/14/2017   Procedure: RIGHT EXTRACORPOREAL SHOCK WAVE LITHOTRIPSY (ESWL);  Surgeon: Bjorn Loser, MD;  Location: [facility];  Service: Urology;  Laterality: Right;  . HAND SURGERY Right 11/2016   trigger finger  . HOLMIUM LASER APPLICATION Right [id number]   Procedure: HOLMIUM LASER APPLICATION;  Surgeon: Irine Seal, MD;  Location[facility];  Service: Urology;  Laterality: Right;  . NASAL SEPTUM SURGERY  DEC 2013  . RIGHT WRIST  EXPLORATION/ CAPSULOTOMY/ TENOLYSIS  03-30-2002  . TENDON REPAIR Left 09/06/2016   Procedure: Tenolysis of left peroneal tendons with repair of peroneus brevus;  Surgeon: [name], MD;  Location: [facility];  Service: Orthopedics;  Laterality: Left;  . TRANSTHORACIC ECHOCARDIOGRAM  03-27-2012   NORMAL LVF/  EF 55-60%/  NO EVIDENCE MVP  . TUBAL LIGATION  1994  . VAGINAL HYSTERECTOMY  1999  . WRIST GANGLION EXCISION Right 11-14-2001    Current Outpatient Medications  Medication Sig Dispense Refill  . diltiazem (DILT-XR) 180 MG 24 hr capsule Take 1 capsule (180 mg total) by mouth every morning. 90 capsule 3  . estradiol (ESTRACE) 1 MG tablet Take 1 mg by mouth daily.    . furosemide (LASIX) 20 MG tablet Take 1 tablet (20 mg total) by mouth daily as needed. 90 tablet 3  . metoprolol succinate (TOPROL-XL) 25 MG 24 hr tablet Take 1 tablet (25 mg total) by mouth daily. 90 tablet 3  . oxybutynin (DITROPAN) 5 MG tablet Take 5 mg by mouth 3 (three) times daily.    . pantoprazole (PROTONIX) 40 MG tablet Take 1 tablet (40 mg total) by mouth 2 (two) times daily. 180 tablet 3  . potassium citrate (UROCIT-K) 10 MEQ (1080 MG) SR tablet potassium citrate ER 10 mEq (1,080 mg) tablet,extended release    . XIIDRA 5 % SOLN      No current facility-administered medications for this visit.     Allergies as of 10/10/2018 - Review Complete 09/30/2018  Allergen Reaction Noted  . Penicillins Hives and Rash 01/19/2009  . Vancomycin Swelling 05/19/2011  . Iohexol Hives 11/21/2006    Family History  Problem Relation Age of Onset  . Hypertension Mother   . Diabetes Mother   . COPD Mother   . Heart disease Mother 67       "aortic replacement" CABG.  Died age 69  . Hypertension Father   . Heart failure Father        open heart surgery  . Stroke Maternal Grandmother     Social History   Socioeconomic History  . Marital status: Married    Spouse name: Not on file  . Number of children:  2  . Years of education: Not on file  . Highest education level: Not on file  Occupational History  . Not on file  Social Needs  . Financial resource strain: Not on file  . Food insecurity    Worry: Not on file    Inability: Not on file  . Transportation needs    Medical: Not on file    Non-medical: Not on file  Tobacco Use  . Smoking status: Never Smoker  . Smokeless tobacco: Never Used  Substance and Sexual Activity  . Alcohol use: No  . Drug use: No  . Sexual activity: Not on file  Lifestyle  . Physical activity    Days per week: Not on file    Minutes per session: Not on file  . Stress: Not  on file  Relationships  . Social Herbalist on phone: Not on file    Gets together: Not on file    Attends religious service: Not on file    Active member of club or organization: Not on file    Attends meetings of clubs or organizations: Not on file    Relationship status: Not on file  . Intimate partner violence    Fear of current or ex partner: Not on file    Emotionally abused: Not on file    Physically abused: Not on file    Forced sexual activity: Not on file  Other Topics Concern  . Not on file  Social History Narrative   Lives with husband and two children.      Review of Systems:    Constitutional: No weight loss, fever or chills Cardiovascular: No chest pain Respiratory: No SOB  Gastrointestinal: See HPI and otherwise negative   Physical Exam:  Vital signs: BP 110/68   Pulse 78   Temp (!) 97.4 F (36.3 C) (Temporal)   Ht 5' (1.524 m)   Wt 167 lb (75.8 kg)   BMI 32.61 kg/m   Constitutional:   Pleasant Caucasian female appears to be in NAD, Well developed, Well nourished, alert and cooperative Respiratory: Respirations even and unlabored. Lungs clear to auscultation bilaterally.   No wheezes, crackles, or rhonchi.  Cardiovascular: Normal S1, S2. No MRG. Regular rate and rhythm. No peripheral edema, cyanosis or pallor.  Gastrointestinal:  Soft,  nondistended, marked RUQ and Epigastric ttp with voluntary and involuntary guarding Normal bowel sounds. No appreciable masses or hepatomegaly. Psychiatric: Demonstrates good judgement and reason without abnormal affect or behaviors.  RELEVANT LABS AND IMAGING: CBC    Component Value Date/Time   WBC 6.8 09/29/2018 1125   RBC 4.12 09/29/2018 1125   HGB 13.2 09/29/2018 1125   HCT 39.5 09/29/2018 1125   PLT 192.0 09/29/2018 1125   MCV 95.8 09/29/2018 1125   MCH 31.9 05/21/2011 1720   MCHC 33.5 09/29/2018 1125   RDW 13.3 09/29/2018 1125   LYMPHSABS 2.9 09/29/2018 1125   MONOABS 0.3 09/29/2018 1125   EOSABS 0.1 09/29/2018 1125   BASOSABS 0.1 09/29/2018 1125    CMP     Component Value Date/Time   NA 142 09/29/2018 1125   NA 144 07/27/2016   K 4.2 09/29/2018 1125   CL 104 09/29/2018 1125   CO2 29 09/29/2018 1125   GLUCOSE 129 (H) 09/29/2018 1125   BUN 7 09/29/2018 1125   BUN 10 07/27/2016   CREATININE 0.97 09/29/2018 1125   CALCIUM 9.5 09/29/2018 1125   PROT 7.1 09/29/2018 1125   ALBUMIN 4.3 09/29/2018 1125   AST 19 09/29/2018 1125   ALT 14 09/29/2018 1125   ALKPHOS 57 09/29/2018 1125   BILITOT 0.5 09/29/2018 1125   GFRNONAA >60 08/30/2016 0920   GFRAA >60 08/30/2016 0920    Assessment: 1.  Chronic right upper quadrant pain: Has been off and on over the past 3 years, multiple evaluations with ultrasounds, last in December 2019, also a HIDA scan negative in 2017, EGD prior to that negative, continues per the patient; could definitely be functional versus gallbladder versus gastric origin 2.  Screening for colorectal cancer: Patient is 83 and due for screening colonoscopy 3.  GERD: Controlled on Pantoprazole 40 mg twice daily but terrible symptoms if she misses a dose 4.  Nausea  Plan: 1.  Discussed with patient that since it has been sometime  since we did an endoscopy and further evaluated her right upper quadrant pain, recommend that we repeat at this time.  She is also  due for screening colonoscopy.  We will schedule these together in the [facility] with Dr. Fuller Plan.  Did discuss risks, benefits, limitations and alternatives and the patient agrees to proceed. 2.  Refilled Dicyclomine 10 mg twice daily, every morning and nightly.  #60 with 3 refills.  Patient has not been on these and cannot recall if that helped her, but we will try again. 3.  Continue Pantoprazole 40 mg twice daily 4.  Prescribed Metoclopramide 10 mg #2, this should be taken 20 to 30 minutes before the first half of prep and 20 to 30 minutes before the second half of prep. 5.  Patient to follow in clinic per recommendations from Dr. Fuller Plan after time of procedure.  Sarah Newer, PA-C [facility] 10/10/2018, 9:10 AM  Cc: [name], MD

## 2018-10-13 NOTE — Progress Notes (Signed)
Reviewed and agree with management plan.  Malcolm T. Stark, MD FACG [facility]  

## 2018-10-23 ENCOUNTER — Encounter: Payer: Self-pay | Admitting: Gastroenterology

## 2018-10-27 ENCOUNTER — Ambulatory Visit (AMBULATORY_SURGERY_CENTER): Payer: 59 | Admitting: Gastroenterology

## 2018-10-27 ENCOUNTER — Other Ambulatory Visit: Payer: Self-pay

## 2018-10-27 ENCOUNTER — Encounter: Payer: Self-pay | Admitting: Gastroenterology

## 2018-10-27 VITALS — BP 104/54 | HR 49 | Temp 96.0°F | Resp 16 | Ht 60.0 in | Wt 167.0 lb

## 2018-10-27 DIAGNOSIS — Z1211 Encounter for screening for malignant neoplasm of colon: Secondary | ICD-10-CM | POA: Diagnosis present

## 2018-10-27 DIAGNOSIS — K317 Polyp of stomach and duodenum: Secondary | ICD-10-CM | POA: Diagnosis not present

## 2018-10-27 DIAGNOSIS — R1011 Right upper quadrant pain: Secondary | ICD-10-CM

## 2018-10-27 DIAGNOSIS — G8929 Other chronic pain: Secondary | ICD-10-CM

## 2018-10-27 DIAGNOSIS — K2950 Unspecified chronic gastritis without bleeding: Secondary | ICD-10-CM

## 2018-10-27 DIAGNOSIS — K2970 Gastritis, unspecified, without bleeding: Secondary | ICD-10-CM

## 2018-10-27 MED ORDER — DICYCLOMINE HCL 10 MG PO CAPS: ORAL_CAPSULE | ORAL | 5 refills | Status: DC

## 2018-10-27 MED ORDER — SODIUM CHLORIDE 0.9 % IV SOLN: 500.0000 mL | Freq: Once | INTRAVENOUS | Status: DC

## 2018-10-27 NOTE — Op Note (Signed)
[facility] Patient Name: Sarah Wong Procedure Date: 10/27/2018 1:22 PM MRN: [id number] Endoscopist: Ladene Artist , MD Age: 53 Referring MD:  Date of Birth: [birth year] Gender: Female Account #: [id number] Procedure:                Colonoscopy Indications:              Screening for colorectal malignant neoplasm Medicines:                Monitored Anesthesia Care Procedure:                Pre-Anesthesia Assessment:                           - Prior to the procedure, a History and Physical                            was performed, and patient medications and                            allergies were reviewed. The patient's tolerance of                            previous anesthesia was also reviewed. The risks                            and benefits of the procedure and the sedation                            options and risks were discussed with the patient.                            All questions were answered, and informed consent                            was obtained. Prior Anticoagulants: The patient has                            taken no previous anticoagulant or antiplatelet                            agents. ASA Grade Assessment: II - A patient with                            mild systemic disease. After reviewing the risks                            and benefits, the patient was deemed in                            satisfactory condition to undergo the procedure.                           After obtaining informed consent, the colonoscope  was passed under direct vision. Throughout the                            procedure, the patient's blood pressure, pulse, and                            oxygen saturations were monitored continuously. The                            Colonoscope was introduced through the anus and                            advanced to the the terminal ileum, with                            identification of the  appendiceal orifice and IC                            valve. The terminal ileum, ileocecal valve,                            appendiceal orifice, and rectum were photographed.                            The quality of the bowel preparation was good. The                            colonoscopy was performed without difficulty. The                            patient tolerated the procedure well. Scope In: 1:28:22 PM Scope Out: 1:39:17 PM Scope Withdrawal Time: 0 hours 8 minutes 41 seconds  Total Procedure Duration: 0 hours 10 minutes 55 seconds  Findings:                 The perianal and digital rectal examinations were                            normal.                           The terminal ileum appeared normal.                           The entire examined colon appeared normal on direct                            and retroflexion views. Complications:            No immediate complications. Estimated blood loss:                            None. Estimated Blood Loss:     Estimated blood loss: none. Impression:               - The examined portion of the ileum was normal.                           -  The entire examined colon is normal on direct and                            retroflexion views.                           - No specimens collected. Recommendation:           - Repeat colonoscopy in 10 years for screening                            purposes.                           - Patient has a contact number available for                            emergencies. The signs and symptoms of potential                            delayed complications were discussed with the                            patient. Return to normal activities tomorrow.                            Written discharge instructions were provided to the                            patient.                           - Resume previous diet.                           - Continue present medications. Ladene Artist,  MD 10/27/2018 1:50:36 PM This report has been signed electronically.

## 2018-10-27 NOTE — Progress Notes (Signed)
Called to room to assist during endoscopic procedure.  Patient ID and intended procedure confirmed with present staff. Received instructions for my participation in the procedure from the performing physician.  

## 2018-10-27 NOTE — Progress Notes (Signed)
Report given to PACU, vss 

## 2018-10-27 NOTE — Op Note (Signed)
[facility] Patient Name: Sarah Wong Procedure Date: 10/27/2018 1:21 PM MRN: [id number] Endoscopist: Ladene Artist , MD Age: 53 Referring MD:  Date of Birth: 1965/12/01 Gender: Female Account #: [id number] Procedure:                Upper GI endoscopy Indications:              Abdominal pain in the right upper quadrant Medicines:                Monitored Anesthesia Care Procedure:                Pre-Anesthesia Assessment:                           - Prior to the procedure, a History and Physical                            was performed, and patient medications and                            allergies were reviewed. The patient's tolerance of                            previous anesthesia was also reviewed. The risks                            and benefits of the procedure and the sedation                            options and risks were discussed with the patient.                            All questions were answered, and informed consent                            was obtained. Prior Anticoagulants: The patient has                            taken no previous anticoagulant or antiplatelet                            agents. ASA Grade Assessment: II - A patient with                            mild systemic disease. After reviewing the risks                            and benefits, the patient was deemed in                            satisfactory condition to undergo the procedure.                           After obtaining informed consent, the endoscope was  passed under direct vision. Throughout the                            procedure, the patient's blood pressure, pulse, and                            oxygen saturations were monitored continuously. The                            Endoscope was introduced through the mouth, and                            advanced to the second part of duodenum. The upper                            GI  endoscopy was accomplished without difficulty.                            The patient tolerated the procedure well. Scope In: Scope Out: Findings:                 The examined esophagus was normal.                           Localized moderate inflammation characterized by                            erythema, friability and granularity was found in                            the gastric body and in the gastric antrum.                            Biopsies were taken with a cold forceps for                            histology.                           Multiple 4 to 7 mm sessile polyps with no bleeding                            and no stigmata of recent bleeding were found in                            the gastric body. Biopsies were taken with a cold                            forceps for histology.                           The exam of the stomach was otherwise normal.                           The duodenal bulb  and second portion of the                            duodenum were normal. Complications:            No immediate complications. Estimated Blood Loss:     Estimated blood loss was minimal. Impression:               - Normal esophagus.                           - Gastritis. Biopsied.                           - Multiple gastric polyps. Biopsied.                           - Normal duodenal bulb and second portion of the                            duodenum. Recommendation:           - Patient has a contact number available for                            emergencies. The signs and symptoms of potential                            delayed complications were discussed with the                            patient. Return to normal activities tomorrow.                            Written discharge instructions were provided to the                            patient.                           - Resume previous diet.                           - Continue present medications.                            - Change dicyclomine to 10 mg po 30 minutes ac and                            hs, #120, 5 refills.                           - Await pathology results.                           - Return to GI office in 6 weeks. Ladene Artist, MD 10/27/2018 1:56:12 PM This report has been signed electronically.

## 2018-10-27 NOTE — Patient Instructions (Addendum)
Handout on gastritis given to you today  Await biopsies of gastritis and of gastric polyps   Return to Dr Lynne Leader office for an appointment in 6 weeks- make this appointment  Change Dicyclomine to 10 mg by mouth 30 minutes before meals and at bedtime -sent to your pharmacy    YOU [place]:   Refer to the procedure report that was given to you for any specific questions about what was found during the examination.  If the procedure report does not answer your questions, please call your gastroenterologist to clarify.  If you requested that your care partner not be given the details of your procedure findings, then the procedure report has been included in a sealed envelope for you to review at your convenience later.  YOU SHOULD EXPECT: Some feelings of bloating in the abdomen. Passage of more gas than usual.  Walking can help get rid of the air that was put into your GI tract during the procedure and reduce the bloating. If you had a lower endoscopy (such as a colonoscopy or flexible sigmoidoscopy) you may notice spotting of blood in your stool or on the toilet paper. If you underwent a bowel prep for your procedure, you may not have a normal bowel movement for a few days.  Please Note:  You might notice some irritation and congestion in your nose or some drainage.  This is from the oxygen used during your procedure.  There is no need for concern and it should clear up in a day or so.  SYMPTOMS TO REPORT IMMEDIATELY:   Following lower endoscopy (colonoscopy or flexible sigmoidoscopy):  Excessive amounts of blood in the stool  Significant tenderness or worsening of abdominal pains  Swelling of the abdomen that is new, acute  Fever of 100F or higher   Following upper endoscopy (EGD)  Vomiting of blood or coffee ground material  New chest pain or pain under the shoulder blades  Painful or persistently difficult swallowing  New  shortness of breath  Fever of 100F or higher  Black, tarry-looking stools  For urgent or emergent issues, a gastroenterologist can be reached at any hour by calling [phone].   DIET:  We do recommend a small meal at first, but then you may proceed to your regular diet.  Drink plenty of fluids but you should avoid alcoholic beverages for 24 hours.  ACTIVITY:  You should plan to take it easy for the rest of today and you should NOT DRIVE or use heavy machinery until tomorrow (because of the sedation medicines used during the test).    FOLLOW UP: Our staff will call the number listed on your records 48-72 hours following your procedure to check on you and address any questions or concerns that you may have regarding the information given to you following your procedure. If we do not reach you, we will leave a message.  We will attempt to reach you two times.  During this call, we will ask if you have developed any symptoms of COVID 19. If you develop any symptoms (ie: fever, flu-like symptoms, shortness of breath, cough etc.) before then, please call [phone].  If you test positive for Covid 19 in the 2 weeks post procedure, please call and report this information to [place].    If any biopsies were taken you will be contacted by phone or by letter within the next 1-3 weeks.  Please call us at [phone]  [phone] if you have not heard about the biopsies in 3 weeks.    SIGNATURES/CONFIDENTIALITY: You and/or your care partner have signed paperwork which will be entered into your electronic medical record.  These signatures attest to the fact that that the information above on your After Visit Summary has been reviewed and is understood.  Full responsibility of the confidentiality of this discharge information lies with you and/or your care-partner.

## 2018-10-29 ENCOUNTER — Telehealth: Payer: Self-pay | Admitting: *Deleted

## 2018-10-29 NOTE — Telephone Encounter (Signed)
  Follow up Call-  Call back number 10/27/2018  Post procedure Call Back phone  # [phone]  Permission to leave phone message Yes  Some recent data might be hidden     Patient questions:  Do you have a fever, pain , or abdominal swelling? No. Pain Score  0 *  Have you tolerated food without any problems? Yes.    Have you been able to return to your normal activities? Yes.    Do you have any questions about your discharge instructions: Diet   No. Medications  No. Follow up visit  No.  Do you have questions or concerns about your Care? No.  Actions: * If pain score is 4 or above: 1. No action needed, pain <4.Have you developed a fever since your procedure? no  2.   Have you had an respiratory symptoms (SOB or cough) since your procedure? no  3.   Have you tested positive for COVID 19 since your procedure no  4.   Have you had any family members/close contacts diagnosed with the COVID 19 since your procedure?  no   If yes to any of these questions please route to Joylene John, RN and Alphonsa Gin, Therapist, sports.

## 2018-11-06 ENCOUNTER — Encounter: Payer: Self-pay | Admitting: Gastroenterology

## 2018-11-18 LAB — HM MAMMOGRAPHY

## 2018-12-02 ENCOUNTER — Ambulatory Visit: Payer: 59 | Admitting: Family Medicine

## 2018-12-05 ENCOUNTER — Ambulatory Visit: Payer: 59 | Admitting: Family Medicine

## 2018-12-08 ENCOUNTER — Ambulatory Visit: Payer: 59 | Admitting: Gastroenterology

## 2018-12-12 ENCOUNTER — Encounter: Payer: Self-pay | Admitting: Family Medicine

## 2018-12-12 ENCOUNTER — Other Ambulatory Visit: Payer: Self-pay

## 2018-12-12 ENCOUNTER — Ambulatory Visit (INDEPENDENT_AMBULATORY_CARE_PROVIDER_SITE_OTHER): Payer: 59 | Admitting: Family Medicine

## 2018-12-12 VITALS — BP 118/76 | HR 48 | Temp 96.5°F | Ht 60.0 in | Wt 165.2 lb

## 2018-12-12 DIAGNOSIS — R739 Hyperglycemia, unspecified: Secondary | ICD-10-CM | POA: Diagnosis not present

## 2018-12-12 LAB — GLUCOSE, POCT (MANUAL RESULT ENTRY): POC Glucose: 108 mg/dl — AB (ref 70–99)

## 2018-12-12 LAB — POCT GLYCOSYLATED HEMOGLOBIN (HGB A1C): Hemoglobin A1C: 5.4 % (ref 4.0–5.6)

## 2018-12-12 NOTE — Patient Instructions (Signed)

## 2018-12-12 NOTE — Progress Notes (Signed)
Subjective:     Patient ID: Sarah Wong, female   DOB: February 23, 1965, 53 y.o.   MRN: [id number]  HPI  Sarah Wong is here to get some follow-up labs from her physical back in September.  She had glucose then 129.  Positive family history of type 2 diabetes in her mother.  She has not had any polyuria or polydipsia.  She has tried to scale back soda intake and overall sugar starch intake weight is down about 2 pounds.  Past Medical History:  Diagnosis Date  . GERD (gastroesophageal reflux disease)   . History of kidney stones   . HTN (hypertension)   . Hyperlipidemia    history of, no medications now  . Mild obstructive sleep apnea    PER STUDY 07-07-2010--  MILD OSA/  NO CPAP RX    . MVP (mitral valve prolapse)   . Renal calculus, bilateral    NON-OBSTRUTIVE  . Right ureteral stone   . Splenic flexure syndrome   . Wears glasses    Past Surgical History:  Procedure Laterality Date  . ANKLE SURGERY Left   . CARDIOVASCULAR STRESS TEST  11-08-2009  DR Rollene Fare   NORMAL PERFUSION STUDY/ NO ISCHEMIA/ EF 63%  . CYSTOSCOPY W/ URETERAL STENT PLACEMENT  05/21/2011   Procedure: CYSTOSCOPY WITH RETROGRADE PYELOGRAM/URETERAL STENT PLACEMENT;  Surgeon: Malka So, MD;  Location: [facility];  Service: Urology;  Laterality: Left;  . CYSTOSCOPY WITH STENT PLACEMENT Right 11/20/2012   Procedure: CYSTOSCOPY WITH STENT PLACEMENT;  Surgeon: Irine Seal, MD;  Location: [facility];  Service: Urology;  Laterality: Right;  . CYSTOSCOPY WITH URETEROSCOPY, STONE BASKETRY AND STENT PLACEMENT Right 11/20/2012   Procedure: RIGHT URETEROSCOPY,BALLOON DILITATION, STONE EXTRACTION  ;  Surgeon: Irine Seal, MD;  Location: [place];  Service: Urology;  Laterality: Right;  . EXTRACORPOREAL SHOCK WAVE LITHOTRIPSY  LEFT 05-31-2011/   RIGHT 10-30-2012  . EXTRACORPOREAL SHOCK WAVE LITHOTRIPSY Right 03/14/2017   Procedure: RIGHT EXTRACORPOREAL SHOCK WAVE LITHOTRIPSY (ESWL);  Surgeon: Bjorn Loser, MD;  Location: [facility];  Service: Urology;  Laterality: Right;  . HAND SURGERY Right 11/2016   trigger finger  . HOLMIUM LASER APPLICATION Right [id number]   Procedure: HOLMIUM LASER APPLICATION;  Surgeon: Irine Seal, MD;  Location: [facility];  Service: Urology;  Laterality: Right;  . NASAL SEPTUM SURGERY  DEC 2013  . RIGHT WRIST EXPLORATION/ CAPSULOTOMY/ TENOLYSIS  03-30-2002  . TENDON REPAIR Left 09/06/2016   Procedure: Tenolysis of left peroneal tendons with repair of peroneus brevus;  Surgeon: [name], MD;  Location: [place];  Service: Orthopedics;  Laterality: Left;  . TRANSTHORACIC ECHOCARDIOGRAM  03-27-2012   NORMAL LVF/  EF 55-60%/  NO EVIDENCE MVP  . TUBAL LIGATION  1994  . VAGINAL HYSTERECTOMY  1999  . WRIST GANGLION EXCISION Right 11-14-2001    reports that she has never smoked. She has never used smokeless tobacco. She reports that she does not drink alcohol or use drugs. family history includes COPD in her mother; Diabetes in her mother; Heart disease (age of onset: 9) in her mother; Heart failure in her father; Hypertension in her father and mother; Stroke in her maternal grandmother. Allergies  Allergen Reactions  . Penicillins Hives and Rash  . Vancomycin Swelling    Other reaction(s): Redness  . Iohexol Hives     Code: HIVES, Desc: HIVES WITH IV CONTRAST MEDIA- PT REQUIRES 13 HR PRE-MEDS, Onset Date: [id number]   . Ivp Dye [  Iodinated Diagnostic Agents] Hives    Review of Systems  Constitutional: Negative for appetite change, chills, fever and unexpected weight change.  Endocrine: Negative for polydipsia and polyuria.       Objective:   Physical Exam Vitals reviewed.  Constitutional:      Appearance: Normal appearance.  Cardiovascular:     Rate and Rhythm: Normal rate and regular rhythm.  Pulmonary:     Effort: Pulmonary effort is normal.     Breath sounds: Normal breath sounds.  Neurological:     Mental Status:  She is alert.        Assessment:     Hyperglycemia with glucose 129 by recent physical labs    Plan:     -Repeat fasting glucose and A1c today here in office. Glucose fasting of 108 and hemoglobin A1c 5.4% -We had a long discussion regarding following low glycemic diet and regular exercise and continued weight loss -We suggested 78-month follow-up to reassess A1c.  We have challenged her to try to get her weight down below 160 pounds we now and then  [name] MD [facility]

## 2019-01-08 ENCOUNTER — Ambulatory Visit: Payer: 59 | Admitting: Gastroenterology

## 2019-04-06 ENCOUNTER — Telehealth: Payer: Self-pay | Admitting: Family Medicine

## 2019-04-06 NOTE — Telephone Encounter (Signed)
Pt states that her last appt, Burchette told her to come in March for fasting labs for her 6 month check up. Informed pt that I do not see any orders put in around the time of her last visit 12/12/18. Pt is wondering if he still wants her to do fasting labs?    Pt can be reached at [phone]

## 2019-04-06 NOTE — Telephone Encounter (Signed)
Pt has been notified that last appt note says to reaccess in June

## 2019-04-21 ENCOUNTER — Telehealth (INDEPENDENT_AMBULATORY_CARE_PROVIDER_SITE_OTHER): Payer: 59 | Admitting: Family Medicine

## 2019-04-21 DIAGNOSIS — J019 Acute sinusitis, unspecified: Secondary | ICD-10-CM

## 2019-04-21 MED ORDER — DOXYCYCLINE HYCLATE 100 MG PO CAPS: 100.0000 mg | ORAL_CAPSULE | Freq: Two times a day (BID) | ORAL | 0 refills | Status: DC

## 2019-04-21 NOTE — Progress Notes (Signed)
Patient ID: Sarah Wong, female   DOB: 1965-11-23, 54 y.o.   MRN: [id number]  This visit type was conducted due to national recommendations for restrictions regarding the COVID-19 pandemic in an effort to limit this patient's exposure and mitigate transmission in our community.   Virtual Visit via Video Note  I connected with Glynis Smiles on 04/21/19 at  4:00 PM EDT by a video enabled telemedicine application and verified that I am speaking with the correct person using two identifiers.  Location patient: home Location provider:work or home office Persons participating in the virtual visit: patient, provider  I discussed the limitations of evaluation and management by telemedicine and the availability of in person appointments. The patient expressed understanding and agreed to proceed.   HPI: Sarah Wong called with about 2-week history of sinus pressure and pain. She has had frequent sinusitis in the past though not recently. She has had bilateral frontal and maxillary sinus pressure. She had occasional bloody drainage and also frequent yellow to greenish nasal drainage. She has some fatigue. Intermittent headaches. No cough. No relief with over-the-counter medications. Allergy to penicillin.   ROS: See pertinent positives and negatives per HPI.  Past Medical History:  Diagnosis Date  . Fundic gland polyps of stomach, benign   . GERD (gastroesophageal reflux disease)   . History of kidney stones   . HTN (hypertension)   . Hyperlipidemia    history of, no medications now  . Mild obstructive sleep apnea    PER STUDY 07-07-2010--  MILD OSA/  NO CPAP RX    . MVP (mitral valve prolapse)   . Renal calculus, bilateral    NON-OBSTRUTIVE  . Right ureteral stone   . Splenic flexure syndrome   . Wears glasses     Past Surgical History:  Procedure Laterality Date  . ANKLE SURGERY Left   . CARDIOVASCULAR STRESS TEST  11-08-2009  DR Rollene Fare   NORMAL PERFUSION STUDY/ NO ISCHEMIA/ EF 63%   . CYSTOSCOPY W/ URETERAL STENT PLACEMENT  05/21/2011   Procedure: CYSTOSCOPY WITH RETROGRADE PYELOGRAM/URETERAL STENT PLACEMENT;  Surgeon: Malka So, MD;  Location: [facility];  Service: Urology;  Laterality: Left;  . CYSTOSCOPY WITH STENT PLACEMENT Right 11/20/2012   Procedure: CYSTOSCOPY WITH STENT PLACEMENT;  Surgeon: Irine Seal, MD;  Location: [facility];  Service: Urology;  Laterality: Right;  . CYSTOSCOPY WITH URETEROSCOPY, STONE BASKETRY AND STENT PLACEMENT Right 11/20/2012   Procedure: RIGHT URETEROSCOPY,BALLOON DILITATION, STONE EXTRACTION  ;  Surgeon: Irine Seal, MD;  Location: [place];  Service: Urology;  Laterality: Right;  . EXTRACORPOREAL SHOCK WAVE LITHOTRIPSY  LEFT 05-31-2011/   RIGHT 10-30-2012  . EXTRACORPOREAL SHOCK WAVE LITHOTRIPSY Right 03/14/2017   Procedure: RIGHT EXTRACORPOREAL SHOCK WAVE LITHOTRIPSY (ESWL);  Surgeon: Bjorn Loser, MD;  Location: [facility];  Service: Urology;  Laterality: Right;  . HAND SURGERY Right 11/2016   trigger finger  . HOLMIUM LASER APPLICATION Right [id number]   Procedure: HOLMIUM LASER APPLICATION;  Surgeon: Irine Seal, MD;  Location: [facility];  Service: Urology;  Laterality: Right;  . NASAL SEPTUM SURGERY  DEC 2013  . RIGHT WRIST EXPLORATION/ CAPSULOTOMY/ TENOLYSIS  03-30-2002  . TENDON REPAIR Left 09/06/2016   Procedure: Tenolysis of left peroneal tendons with repair of peroneus brevus;  Surgeon: [name], MD;  Location: [place];  Service: Orthopedics;  Laterality: Left;  . TRANSTHORACIC ECHOCARDIOGRAM  03-27-2012   NORMAL LVF/  EF 55-60%/  NO EVIDENCE MVP  . TUBAL  LIGATION  1994  . VAGINAL HYSTERECTOMY  1999  . WRIST GANGLION EXCISION Right 11-14-2001    Family History  Problem Relation Age of Onset  . Hypertension Mother   . Diabetes Mother   . COPD Mother   . Heart disease Mother 58       "aortic replacement" CABG.  Died age 49  . Hypertension Father   .  Heart failure Father        open heart surgery  . Stroke Maternal Grandmother   . Colon polyps Neg Hx   . Rectal cancer Neg Hx   . Stomach cancer Neg Hx   . Esophageal cancer Neg Hx     SOCIAL HX: Non-smoker   Current Outpatient Medications:  .  dicyclomine (BENTYL) 10 MG capsule, Dicyclomine 10 mg po TID 30 minutes before meals and hs, Disp: 120 capsule, Rfl: 5 .  diltiazem (DILT-XR) 180 MG 24 hr capsule, Take 1 capsule (180 mg total) by mouth every morning., Disp: 90 capsule, Rfl: 3 .  doxycycline (VIBRAMYCIN) 100 MG capsule, Take 1 capsule (100 mg total) by mouth 2 (two) times daily., Disp: 20 capsule, Rfl: 0 .  estradiol (ESTRACE) 1 MG tablet, Take 1 mg by mouth daily., Disp: , Rfl:  .  furosemide (LASIX) 20 MG tablet, Take 1 tablet (20 mg total) by mouth daily as needed., Disp: 90 tablet, Rfl: 3 .  metoprolol succinate (TOPROL-XL) 25 MG 24 hr tablet, Take 1 tablet (25 mg total) by mouth daily., Disp: 90 tablet, Rfl: 3 .  oxybutynin (DITROPAN) 5 MG tablet, Take 5 mg by mouth 3 (three) times daily., Disp: , Rfl:  .  pantoprazole (PROTONIX) 40 MG tablet, Take 1 tablet (40 mg total) by mouth 2 (two) times daily., Disp: 180 tablet, Rfl: 3 .  potassium citrate (UROCIT-K) 10 MEQ (1080 MG) SR tablet, potassium citrate ER 10 mEq (1,080 mg) tablet,extended release, Disp: , Rfl:  .  XIIDRA 5 % SOLN, , Disp: , Rfl:   EXAM:  VITALS per patient if applicable:  GENERAL: alert, oriented, appears well and in no acute distress  HEENT: atraumatic, conjunttiva clear, no obvious abnormalities on inspection of external nose and ears  NECK: normal movements of the head and neck  LUNGS: on inspection no signs of respiratory distress, breathing rate appears normal, no obvious gross SOB, gasping or wheezing  CV: no obvious cyanosis  MS: moves all visible extremities without noticeable abnormality  PSYCH/NEURO: pleasant and cooperative, no obvious depression or anxiety, speech and thought  processing grossly intact  ASSESSMENT AND PLAN:  Discussed the following assessment and plan:  Acute sinusitis symptoms involving frontal and maxillary sinuses  -Doxycycline 100 mg twice daily for 10 days -Plenty fluids and consider nasal saline irrigation with distilled saline water -Follow-up for any persistent or worsening symptoms     I discussed the assessment and treatment plan with the patient. The patient was provided an opportunity to ask questions and all were answered. The patient agreed with the plan and demonstrated an understanding of the instructions.   The patient was advised to call back or seek an in-person evaluation if the symptoms worsen or if the condition fails to improve as anticipated.    Carolann Littler, MD

## 2019-05-14 ENCOUNTER — Ambulatory Visit: Payer: 59 | Admitting: Gastroenterology

## 2019-05-27 ENCOUNTER — Ambulatory Visit (INDEPENDENT_AMBULATORY_CARE_PROVIDER_SITE_OTHER): Payer: 59 | Admitting: Gastroenterology

## 2019-05-27 ENCOUNTER — Encounter: Payer: Self-pay | Admitting: Gastroenterology

## 2019-05-27 VITALS — BP 110/74 | HR 72 | Ht 60.0 in | Wt 165.2 lb

## 2019-05-27 DIAGNOSIS — R0789 Other chest pain: Secondary | ICD-10-CM

## 2019-05-27 DIAGNOSIS — K580 Irritable bowel syndrome with diarrhea: Secondary | ICD-10-CM | POA: Diagnosis not present

## 2019-05-27 MED ORDER — DICYCLOMINE HCL 20 MG PO TABS: 20.0000 mg | ORAL_TABLET | Freq: Three times a day (TID) | ORAL | 11 refills | Status: DC

## 2019-05-27 NOTE — Progress Notes (Addendum)
    History of Present Illness: This is a 54 year old female with chronic intermittent RUQ abdominal pain/chest pain and intermittent diarrhea following meals. See 10/2018 office note from Ellouise Newer, [facility] for summary of prior evaluations.  She localizes her pain to her right lower ribs it is intermittent and not related to meals or bowel movements.  She often has postprandial urgent diarrhea this has not changed.  Colonoscopy 10/2018 Normal to TI  EGD 10/2018 - Normal esophagus. - Gastritis. Biopsied. Slight chronic inflammation - Multiple gastric polyps. Biopsied. Benign gastric fundic polyps - Normal duodenal bulb and second portion of the duodenum.  Current Medications, Allergies, Past Medical History, Past Surgical History, Family History and Social History were reviewed in [employer] record.   Physical Exam: General: Well developed, well nourished, no acute distress Head: Normocephalic and atraumatic Eyes:  sclerae anicteric, EOMI Ears: Normal auditory acuity Mouth: Not examined, mask on during Covid-19 pandemic Lungs: Clear throughout to auscultation Chest: Right lower anterior chest wall tenderness to palpation Heart: Regular rate and rhythm; no murmurs, rubs or bruits Abdomen: Soft, non tender and non distended. No masses, hepatosplenomegaly or hernias noted. Normal Bowel sounds Rectal: Not done Musculoskeletal: Symmetrical with no gross deformities  Pulses:  Normal pulses noted Extremities: No clubbing, cyanosis, edema or deformities noted Neurological: Alert oriented x 4, grossly nonfocal Psychological:  Alert and cooperative. Normal mood and affect   Assessment and Recommendations:  1.  Right lower anterior chest wall pain, tenderness.  No abdominal tenderness on exam.  Ibuprofen 400 mg 3 times daily for 10 days and then 3 times daily as needed.  No further gastrointestinal evaluation recommended at this time.  Further follow-up with  PCP.  2.  Intermittent postprandial diarrhea consistent with IBS and food intolerances.  Trial of increasing dicyclomine to 20 mg p.o. before meals and at bedtime.  3. BMI=32.  She relates difficulty losing weight and is motivated to lose weight.  Referral to weight management

## 2019-05-27 NOTE — Patient Instructions (Signed)
Increase your dicyclomine to 20 mg before meals and at bedtime. A new prescription has been sent to your pharmacy.   Take over the counter Advil 400 mg three times a day x 10 days then as needed.  If your symptoms are no better after the Advil then contact your primary care physician.   Thank you for choosing me and [facility].  Pricilla Riffle. Dagoberto Ligas., MD., [facility]

## 2019-06-11 ENCOUNTER — Other Ambulatory Visit: Payer: Self-pay

## 2019-06-12 ENCOUNTER — Ambulatory Visit: Payer: 59 | Admitting: Family Medicine

## 2019-06-24 ENCOUNTER — Ambulatory Visit: Payer: 59 | Admitting: Family Medicine

## 2019-07-01 ENCOUNTER — Ambulatory Visit: Payer: 59 | Admitting: Family Medicine

## 2019-07-01 DIAGNOSIS — Z0289 Encounter for other administrative examinations: Secondary | ICD-10-CM

## 2019-07-01 IMAGING — CT CT NECK W/O CM
4 of 5 series · 15 of 33 positions shown, 17 images · non-contrast
Comparison: Ultrasound 08/20/2017.  CT 05/02/2016.

CLINICAL DATA: Lump in the left posterior neck. Indeterminate
findings at ultrasound.

EXAM:
CT NECK WITHOUT CONTRAST
TECHNIQUE: Multidetector CT imaging of the neck was performed following the
standard protocol without intravenous contrast.

[Series 3: axial neck · axial · 0.51mm/px · z∈[-198,-90]mm · 3 of 108 slices shown]
[im 27/108  bone]
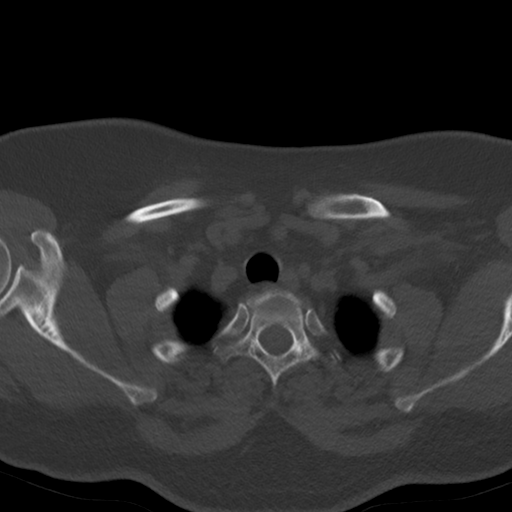
[im 54/108  bone]
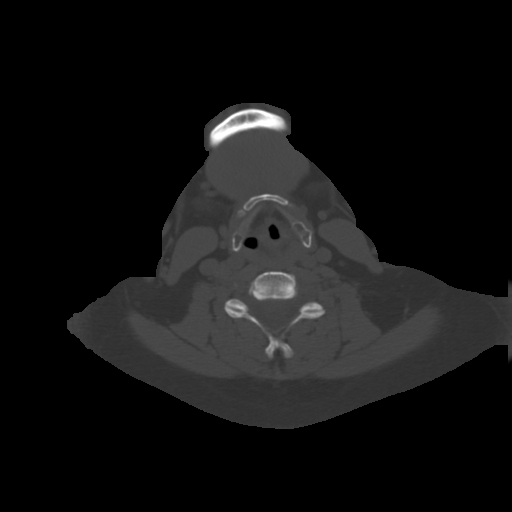
[im 81/108  bone]
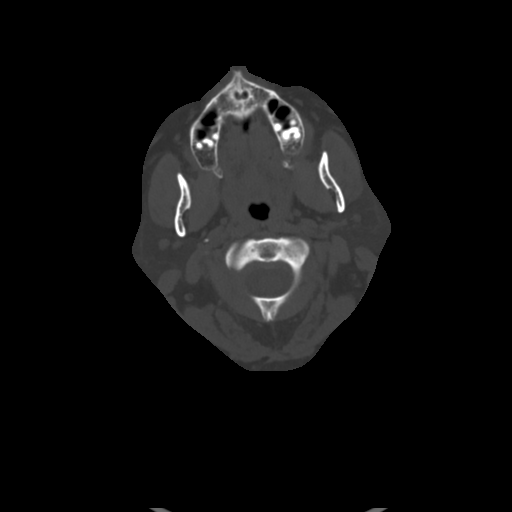

[Series 7: orthogonal ax · axial · 0.43mm/px · z∈[-208,-80]mm · 4 of 108 slices shown, 5 images]
[im 22/108  soft-tissue]
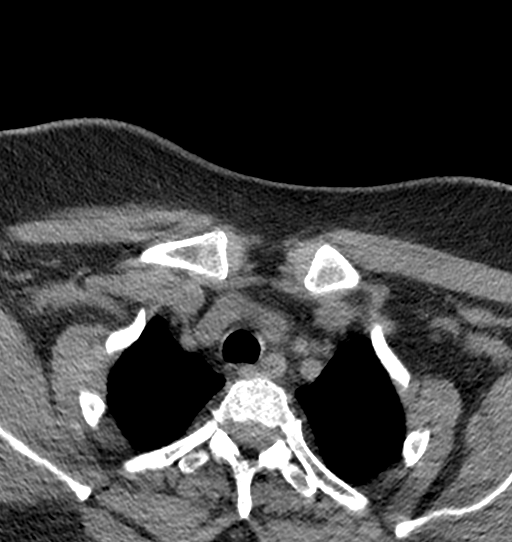
[im 22/108  bone]
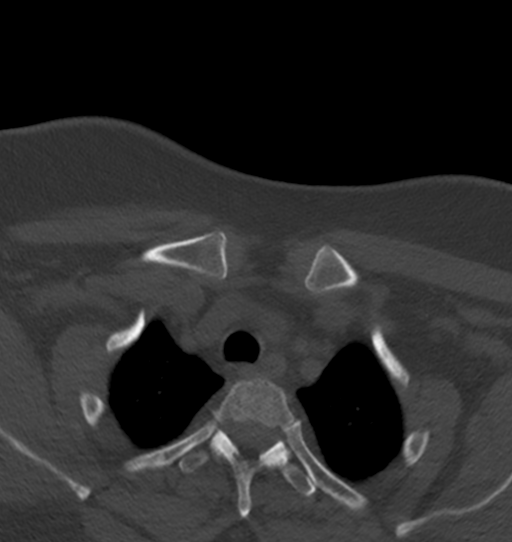
[im 43/108  bone]
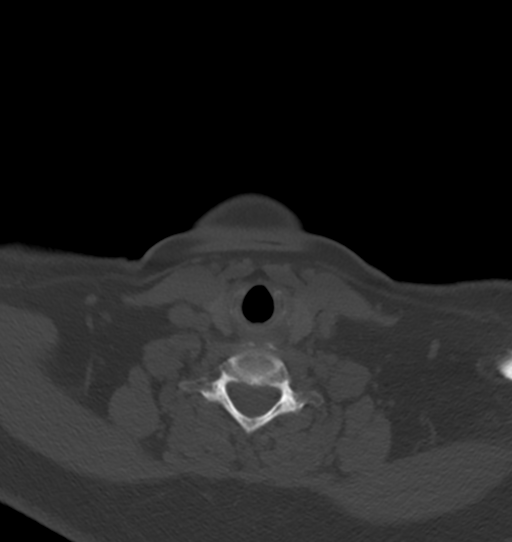
[im 65/108  bone]
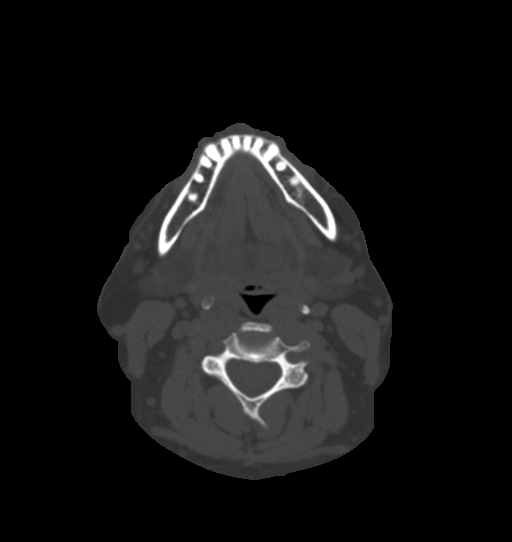
[im 86/108  bone]
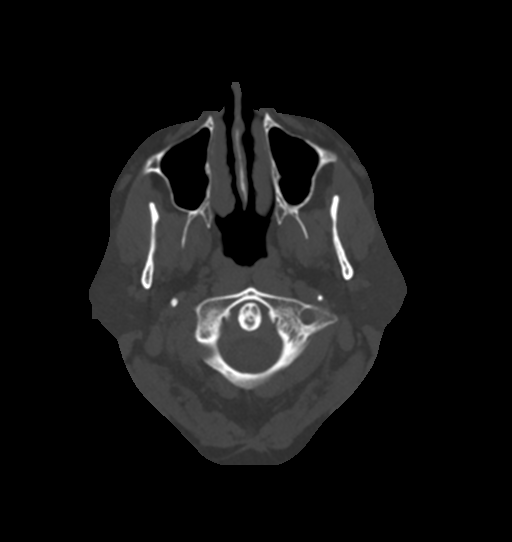

[Series 8: cor neck · coronal · 0.39mm/px · 3 of 113 slices shown]
[im 23/113  bone]
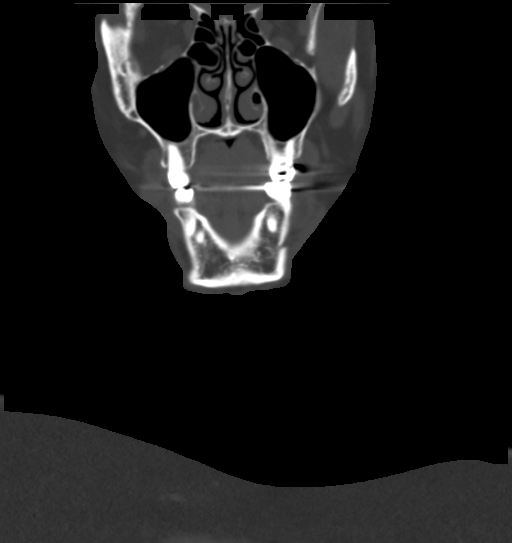
[im 45/113  bone]
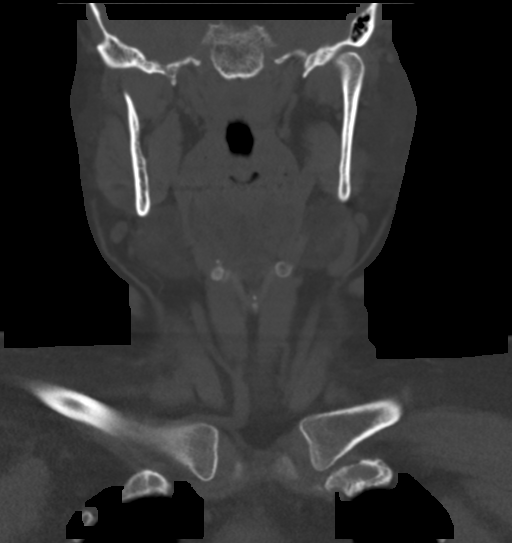
[im 68/113  bone]
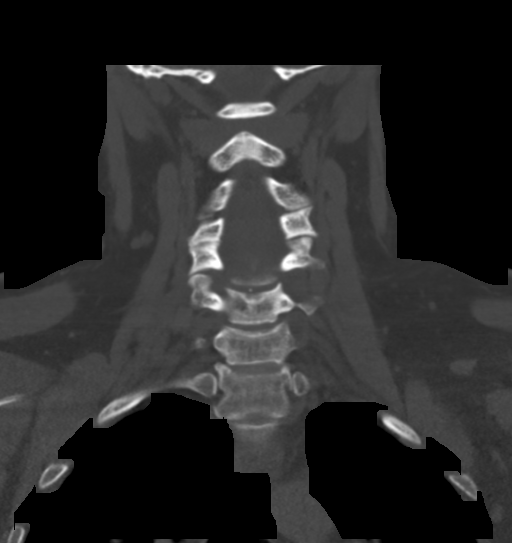

[Series 9: sag neck · sagittal · 0.43mm/px · 5 of 112 slices shown, 6 images]
[im 38/112  bone]
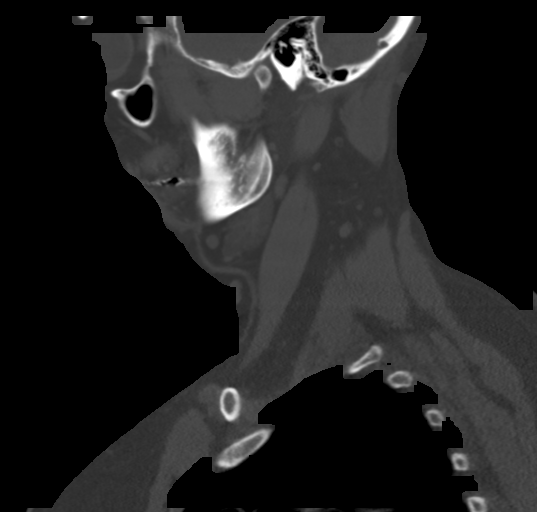
[im 47/112  bone]
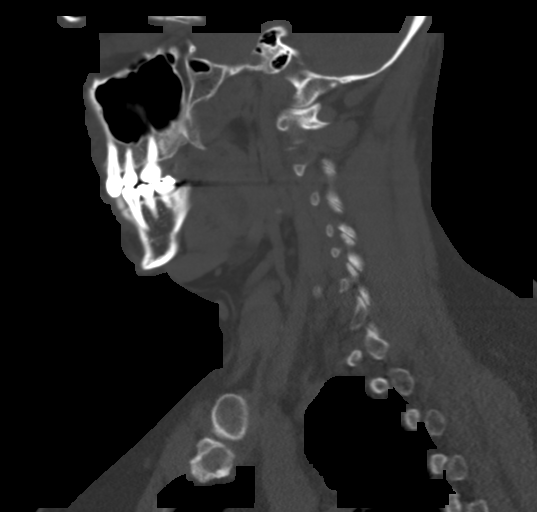
[im 56/112  soft-tissue]
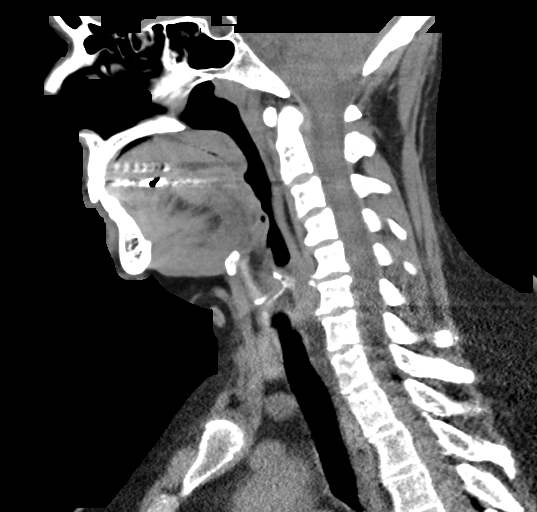
[im 56/112  bone]
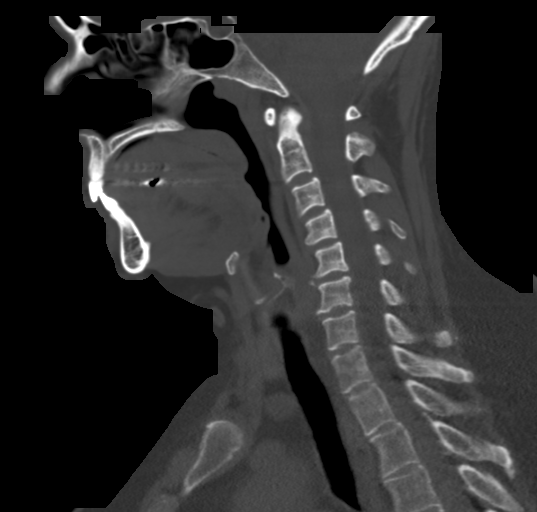
[im 65/112  bone]
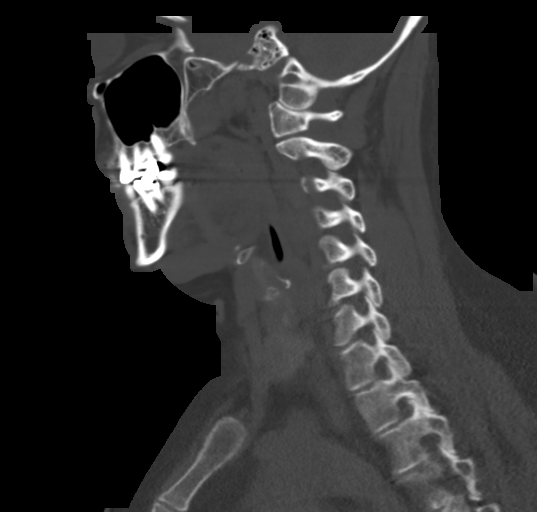
[im 75/112  bone]
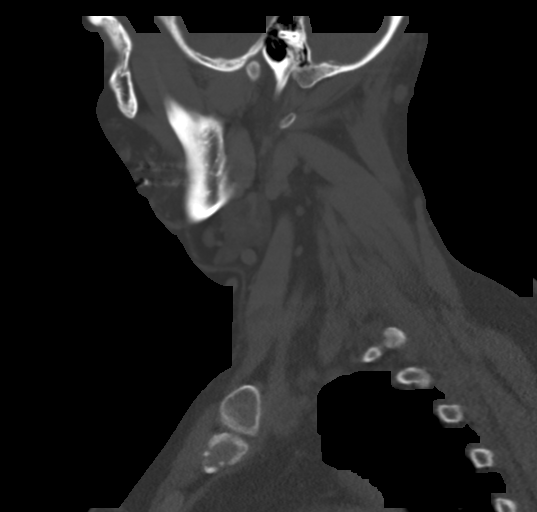

[15 of 33 positions shown; findings below may reference images not displayed]

FINDINGS: Pharynx and larynx: No mucosal or submucosal lesion.

Salivary glands: Parotid and submandibular glands are normal.

Thyroid: Normal

Lymph nodes: No worrisome lymph nodes. The lymph node visible at
sonography in the left posterior neck is demonstrated measuring 9 x
6 mm, with a prominent fatty hilum, explaining the ultrasound
appearance. There is no evidence of pathologic node or other soft
tissue mass in the region. The appearance is unchanged since the
prior CT.

Vascular: No abnormal vascular finding.

Limited intracranial: Normal

Visualized orbits: Normal

Mastoids and visualized paranasal sinuses: Clear

Skeleton: Ordinary mild cervical spondylosis.

Upper chest: Normal

Other: None
IMPRESSION: The ultrasound finding appears to correlate with a largely fatty
replaced lymph node in the left posterior neck, which is unchanged
when compared to the ultrasound and a CT scan of 05/02/2016. There
is no worrisome finding at CT. No evidence of worrisome lymph node
or of any soft tissue mass.

## 2019-07-15 ENCOUNTER — Other Ambulatory Visit: Payer: Self-pay

## 2019-07-15 ENCOUNTER — Ambulatory Visit (INDEPENDENT_AMBULATORY_CARE_PROVIDER_SITE_OTHER): Payer: 59 | Admitting: Family Medicine

## 2019-07-15 VITALS — BP 126/68 | HR 64 | Temp 97.9°F | Wt 164.1 lb

## 2019-07-15 DIAGNOSIS — R739 Hyperglycemia, unspecified: Secondary | ICD-10-CM | POA: Diagnosis not present

## 2019-07-15 LAB — POCT GLYCOSYLATED HEMOGLOBIN (HGB A1C): Hemoglobin A1C: 5.4 % (ref 4.0–5.6)

## 2019-07-15 NOTE — Patient Instructions (Signed)
You had Tdap in June 2019  Continue with yearly flu vaccine.

## 2019-07-15 NOTE — Progress Notes (Signed)
Established Patient Office Visit  Subjective:  Patient ID: Sarah Wong, female    DOB: 11/30/1965  Age: 54 y.o. MRN: [id number]  CC:  Chief Complaint  Patient presents with  . Follow-up    Pt is here for 6 month follow up     HPI ARRA CONNAUGHTON presents for 17-month follow-up to reassess blood sugars. Her mom had type 2 diabetes. She was seen in December and A1c was 5.4%. We challenged her at that point to try to lose some weight and her weight is essentially the same. She does have scheduled follow-up tomorrow to establish with weight loss clinic. Not consistently exercising. She tries to watch high glycemic foods. No polyuria or polydipsia. She has history of dyslipidemia. No history of hypertension.  Past Medical History:  Diagnosis Date  . Fundic gland polyps of stomach, benign   . GERD (gastroesophageal reflux disease)   . History of kidney stones   . HTN (hypertension)   . Hyperlipidemia    history of, no medications now  . Mild obstructive sleep apnea    PER STUDY 07-07-2010--  MILD OSA/  NO CPAP RX    . MVP (mitral valve prolapse)   . Renal calculus, bilateral    NON-OBSTRUTIVE  . Right ureteral stone   . Splenic flexure syndrome   . Wears glasses     Past Surgical History:  Procedure Laterality Date  . ANKLE SURGERY Left   . CARDIOVASCULAR STRESS TEST  11-08-2009  DR Rollene Fare   NORMAL PERFUSION STUDY/ NO ISCHEMIA/ EF 63%  . CYSTOSCOPY W/ URETERAL STENT PLACEMENT  05/21/2011   Procedure: CYSTOSCOPY WITH RETROGRADE PYELOGRAM/URETERAL STENT PLACEMENT;  Surgeon: Malka So, MD;  Location: [facility];  Service: Urology;  Laterality: Left;  . CYSTOSCOPY WITH STENT PLACEMENT Right 11/20/2012   Procedure: CYSTOSCOPY WITH STENT PLACEMENT;  Surgeon: Irine Seal, MD;  Location: [facility];  Service: Urology;  Laterality: Right;  . CYSTOSCOPY WITH URETEROSCOPY, STONE BASKETRY AND STENT PLACEMENT Right 11/20/2012   Procedure: RIGHT URETEROSCOPY,BALLOON  DILITATION, STONE EXTRACTION  ;  Surgeon: Irine Seal, MD;  Location: [facility];  Service: Urology;  Laterality: Right;  . EXTRACORPOREAL SHOCK WAVE LITHOTRIPSY  LEFT 05-31-2011/   RIGHT 10-30-2012  . EXTRACORPOREAL SHOCK WAVE LITHOTRIPSY Right 03/14/2017   Procedure: RIGHT EXTRACORPOREAL SHOCK WAVE LITHOTRIPSY (ESWL);  Surgeon: Bjorn Loser, MD;  Location: [facility];  Service: Urology;  Laterality: Right;  . HAND SURGERY Right 11/2016   trigger finger  . HOLMIUM LASER APPLICATION Right [id number]/9381   Procedure: HOLMIUM LASER APPLICATION;  Surgeon: Irine Seal, MD;  Location: [facility];  Service: Urology;  Laterality: Right;  . NASAL SEPTUM SURGERY  DEC 2013  . RIGHT WRIST EXPLORATION/ CAPSULOTOMY/ TENOLYSIS  03-30-2002  . TENDON REPAIR Left 09/06/2016   Procedure: Tenolysis of left peroneal tendons with repair of peroneus brevus;  Surgeon: [name], MD;  Location: [facility];  Service: Orthopedics;  Laterality: Left;  . TRANSTHORACIC ECHOCARDIOGRAM  03-27-2012   NORMAL LVF/  EF 55-60%/  NO EVIDENCE MVP  . TUBAL LIGATION  1994  . VAGINAL HYSTERECTOMY  1999  . WRIST GANGLION EXCISION Right 11-14-2001    Family History  Problem Relation Age of Onset  . Hypertension Mother   . Diabetes Mother   . COPD Mother   . Heart disease Mother 19       "aortic replacement" CABG.  Died age 56  . Hypertension Father   .  Heart failure Father        open heart surgery  . Stroke Maternal Grandmother   . Colon polyps Neg Hx   . Rectal cancer Neg Hx   . Stomach cancer Neg Hx   . Esophageal cancer Neg Hx     Social History   Socioeconomic History  . Marital status: Married    Spouse name: Not on file  . Number of children: 2  . Years of education: Not on file  . Highest education level: Not on file  Occupational History  . Not on file  Tobacco Use  . Smoking status: Never Smoker  . Smokeless tobacco: Never Used  Vaping Use  . Vaping Use:  Never used  Substance and Sexual Activity  . Alcohol use: No  . Drug use: No  . Sexual activity: Not on file  Other Topics Concern  . Not on file  Social History Narrative   Lives with husband and two children.     Social Determinants of Health   Financial Resource Strain:   . Difficulty of Paying Living Expenses:   Food Insecurity:   . Worried About Charity fundraiser in the Last Year:   . Arboriculturist in the Last Year:   Transportation Needs:   . Film/video editor (Medical):   Marland Kitchen Lack of Transportation (Non-Medical):   Physical Activity:   . Days of Exercise per Week:   . Minutes of Exercise per Session:   Stress:   . Feeling of Stress :   Social Connections:   . Frequency of Communication with Friends and Family:   . Frequency of Social Gatherings with Friends and Family:   . Attends Religious Services:   . Active Member of Clubs or Organizations:   . Attends Archivist Meetings:   Marland Kitchen Marital Status:   Intimate Partner Violence:   . Fear of Current or Ex-Partner:   . Emotionally Abused:   Marland Kitchen Physically Abused:   . Sexually Abused:     Outpatient Medications Prior to Visit  Medication Sig Dispense Refill  . dicyclomine (BENTYL) 20 MG tablet Take 1 tablet (20 mg total) by mouth 4 (four) times daily -  before meals and at bedtime. 120 tablet 11  . doxycycline (VIBRAMYCIN) 100 MG capsule Take 1 capsule (100 mg total) by mouth 2 (two) times daily. 20 capsule 0  . furosemide (LASIX) 20 MG tablet Take 1 tablet (20 mg total) by mouth daily as needed. 90 tablet 3  . metoprolol succinate (TOPROL-XL) 25 MG 24 hr tablet Take 1 tablet (25 mg total) by mouth daily. 90 tablet 3  . oxybutynin (DITROPAN) 5 MG tablet Take 5 mg by mouth 3 (three) times daily.    . pantoprazole (PROTONIX) 40 MG tablet Take 1 tablet (40 mg total) by mouth 2 (two) times daily. 180 tablet 3  . potassium citrate (UROCIT-K) 10 MEQ (1080 MG) SR tablet potassium citrate ER 10 mEq (1,080 mg)  tablet,extended release    . XIIDRA 5 % SOLN      No facility-administered medications prior to visit.    Allergies  Allergen Reactions  . Penicillins Hives and Rash  . Vancomycin Swelling    Other reaction(s): Redness  . Iohexol Hives     Code: HIVES, Desc: HIVES WITH IV CONTRAST MEDIA- PT REQUIRES 13 HR PRE-MEDS, Onset Date: [id number]   . Ivp Dye [Iodinated Diagnostic Agents] Hives    ROS Review of Systems  Constitutional: Negative for  fatigue.  Eyes: Negative for visual disturbance.  Respiratory: Negative for cough, chest tightness, shortness of breath and wheezing.   Cardiovascular: Negative for chest pain, palpitations and leg swelling.  Endocrine: Negative for polydipsia and polyuria.  Neurological: Negative for dizziness, seizures, syncope, weakness, light-headedness and headaches.      Objective:    Physical Exam Constitutional:      Appearance: She is well-developed.  Eyes:     Pupils: Pupils are equal, round, and reactive to light.  Neck:     Thyroid: No thyromegaly.     Vascular: No JVD.  Cardiovascular:     Rate and Rhythm: Normal rate and regular rhythm.     Heart sounds: No gallop.   Pulmonary:     Effort: Pulmonary effort is normal. No respiratory distress.     Breath sounds: Normal breath sounds. No wheezing or rales.  Musculoskeletal:     Cervical back: Neck supple.  Neurological:     Mental Status: She is alert.     BP 126/68 (BP Location: Left Arm, Patient Position: Sitting, Cuff Size: Normal)   Pulse 64   Temp 97.9 F (36.6 C) (Temporal)   Wt 164 lb 1.6 oz (74.4 kg)   SpO2 98%   BMI 32.05 kg/m  Wt Readings from Last 3 Encounters:  07/15/19 164 lb 1.6 oz (74.4 kg)  05/27/19 165 lb 4 oz (75 kg)  12/12/18 165 lb 3.2 oz (74.9 kg)     Health Maintenance Due  Topic Date Due  . COVID-19 Vaccine (1) Never done  . HIV Screening  Never done    There are no preventive care reminders to display for this patient.  Lab Results  Component  Value Date   TSH 2.03 09/29/2018   Lab Results  Component Value Date   WBC 6.8 09/29/2018   HGB 13.2 09/29/2018   HCT 39.5 09/29/2018   MCV 95.8 09/29/2018   PLT 192.0 09/29/2018   Lab Results  Component Value Date   NA 142 09/29/2018   K 4.2 09/29/2018   CO2 29 09/29/2018   GLUCOSE 129 (H) 09/29/2018   BUN 7 09/29/2018   CREATININE 0.97 09/29/2018   BILITOT 0.5 09/29/2018   ALKPHOS 57 09/29/2018   AST 19 09/29/2018   ALT 14 09/29/2018   PROT 7.1 09/29/2018   ALBUMIN 4.3 09/29/2018   CALCIUM 9.5 09/29/2018   ANIONGAP 6 08/30/2016   GFR 59.54 (L) 09/29/2018   Lab Results  Component Value Date   CHOL 204 (H) 09/29/2018   Lab Results  Component Value Date   HDL 54.20 09/29/2018   Lab Results  Component Value Date   LDLCALC 118 (H) 09/29/2018   Lab Results  Component Value Date   TRIG 158.0 (H) 09/29/2018   Lab Results  Component Value Date   CHOLHDL 4 09/29/2018   Lab Results  Component Value Date   HGBA1C 5.4 07/15/2019      Assessment & Plan:   Problem List Items Addressed This Visit    None    Visit Diagnoses    Hyperglycemia    -  Primary   Relevant Orders   POCT glycosylated hemoglobin (Hb A1C) (Completed)    A1c stable today at 5.4%. We began recommend she try to lose some weight and she will be following up with the weight loss and wellness clinic with [facility]. Would like to see her BMI at least below 30. Establish more consistent exercise. She was scheduled for physical this fall  No orders of the defined types were placed in this encounter.   Follow-up: No follow-ups on file.    Carolann Littler, MD

## 2019-07-16 ENCOUNTER — Encounter (INDEPENDENT_AMBULATORY_CARE_PROVIDER_SITE_OTHER): Payer: Self-pay | Admitting: Family Medicine

## 2019-07-16 ENCOUNTER — Ambulatory Visit (INDEPENDENT_AMBULATORY_CARE_PROVIDER_SITE_OTHER): Payer: 59 | Admitting: Family Medicine

## 2019-07-16 ENCOUNTER — Other Ambulatory Visit: Payer: Self-pay

## 2019-07-16 VITALS — BP 123/63 | HR 43 | Temp 97.9°F | Ht 61.0 in | Wt 160.0 lb

## 2019-07-16 DIAGNOSIS — I10 Essential (primary) hypertension: Secondary | ICD-10-CM

## 2019-07-16 DIAGNOSIS — R079 Chest pain, unspecified: Secondary | ICD-10-CM

## 2019-07-16 DIAGNOSIS — R0602 Shortness of breath: Secondary | ICD-10-CM

## 2019-07-16 DIAGNOSIS — R5383 Other fatigue: Secondary | ICD-10-CM

## 2019-07-16 DIAGNOSIS — Z1331 Encounter for screening for depression: Secondary | ICD-10-CM

## 2019-07-16 DIAGNOSIS — Z6830 Body mass index (BMI) 30.0-30.9, adult: Secondary | ICD-10-CM

## 2019-07-16 DIAGNOSIS — Z9189 Other specified personal risk factors, not elsewhere classified: Secondary | ICD-10-CM | POA: Diagnosis not present

## 2019-07-16 DIAGNOSIS — G8929 Other chronic pain: Secondary | ICD-10-CM

## 2019-07-16 DIAGNOSIS — G4733 Obstructive sleep apnea (adult) (pediatric): Secondary | ICD-10-CM

## 2019-07-16 DIAGNOSIS — R739 Hyperglycemia, unspecified: Secondary | ICD-10-CM | POA: Diagnosis not present

## 2019-07-16 DIAGNOSIS — Z0289 Encounter for other administrative examinations: Secondary | ICD-10-CM

## 2019-07-16 DIAGNOSIS — E669 Obesity, unspecified: Secondary | ICD-10-CM

## 2019-07-16 DIAGNOSIS — E785 Hyperlipidemia, unspecified: Secondary | ICD-10-CM

## 2019-07-16 NOTE — Progress Notes (Signed)
Dear Dr. Fuller Plan,   Thank you for referring Sarah Wong to our clinic. The following note includes my evaluation and treatment recommendations.  Chief Complaint:   OBESITY Sarah Wong (MR# [id number]) is a 54 y.o. female who presents for evaluation and treatment of obesity and related comorbidities. Current BMI is Body mass index is 30.23 kg/m. Sarah Wong has been struggling with her weight for many years and has been unsuccessful in either losing weight, maintaining weight loss, or reaching her healthy weight goal.  Sarah Wong is currently in the action stage of change and ready to dedicate time achieving and maintaining a healthier weight. Sarah Wong is interested in becoming our patient and working on intensive lifestyle modifications including (but not limited to) diet and exercise for weight loss.  Sarah Wong says, "right now I weight the most I ever have in my life".  She lives with her husband, Sarah Wong, and her daughter and granddaughter.  She drinks 3-5 sodas pre day (Coke and Pepsi).  She has a desk job, works full time.  She drinks 16 ounces or less of water per day.  She also drinks milk, juice, and sweet tea.  Sarah Wong's habits were reviewed today and are as follows: Her family eats meals together, she thinks her family will eat healthier with her, her desired weight loss is 19 pounds, she started gaining weight when she started caring for her mother, her heaviest weight ever was 175 pounds, she craves sweets, she snacks frequently in the evenings, she skips breakfast frequently, she is frequently drinking liquids with calories, she frequently makes poor food choices and she struggles with emotional eating.  Depression Screen Sarah Wong's Food and Mood (modified PHQ-9) score was 5.  Depression screen PHQ 2/9 07/16/2019  Decreased Interest 1  Down, Depressed, Hopeless 0  PHQ - 2 Score 1  Altered sleeping 1  Tired, decreased energy 1  Change in appetite 1  Feeling bad or failure about yourself   1  Trouble concentrating 0  Moving slowly or fidgety/restless 0  Suicidal thoughts 0  PHQ-9 Score 5  Difficult doing work/chores Not difficult at all   Subjective:   1. Other fatigue Sarah Wong denies daytime somnolence and denies waking up still tired. Patent has a history of symptoms of snoring. Sarah Wong generally gets 6 hours of sleep per night, and states that she has generally restful sleep. Snoring is present. Apneic episodes are not present. Epworth Sleepiness Score is 14.  2. Shortness of breath on exertion Sarah Wong notes increasing shortness of breath with exercising and seems to be worsening over time with weight gain. She notes getting out of breath sooner with activity than she used to. This has gotten worse recently. Sarah Wong denies shortness of breath at rest or orthopnea.  3. Essential hypertension Review: taking medications as instructed, no medication side effects noted, no chest pain on exertion, no dyspnea on exertion, no swelling of ankles.  Blood pressure is at goal.  No symptoms.  BP Readings from Last 3 Encounters:  07/16/19 123/63  07/15/19 126/68  05/27/19 110/74   4. Hyperglycemia Sarah Wong has a history of some elevated blood glucose readings without a diagnosis of diabetes. She denies polyphagia.  A1c in PCPs office yesterday was 5.4.  Back in 2016, fasting blood glucose was 116, 112.  5. Chronic chest pain Sarah Wong sees Cardiology, Dr. Percival Spanish, once a year.  Echo in 2014 showed no regurg or MVP.  CT coronary in August 2018 showed normal coronaries and no evidence of  calcium.  She says her symptoms are stable.  6. OSA (obstructive sleep apnea) Sarah Wong has a diagnosis of sleep apnea. She reports that she is not using a CPAP regularly.  She scored a 14 on our Epworth screening questionnaire.  She has had 2 sleep studies in the past, all negative.  The last one was 5 years ago.  Positive history of "sleep apnea symptoms" by ENT.  Symptoms stable now and not worsening over the  years.  7. Hyperlipidemia, unspecified hyperlipidemia type Sarah Wong has hyperlipidemia and has been trying to improve her cholesterol levels with intensive lifestyle modification including a low saturated fat diet, exercise and weight loss. She denies any chest pain, claudication or myalgias.  Per Cardiology, she has no need for a statin or aspirin at this time.  She was last seen 09/2018.  She goes yearly.  Lab Results  Component Value Date   ALT 16 07/16/2019   AST 19 07/16/2019   ALKPHOS 67 07/16/2019   BILITOT 0.5 07/16/2019   Lab Results  Component Value Date   CHOL 226 (H) 07/16/2019   HDL 58 07/16/2019   LDLCALC 136 (H) 07/16/2019   TRIG 182 (H) 07/16/2019   CHOLHDL 3.9 07/16/2019   8. Depression screening Sarah Wong was screened for depression as part of her new patient workup.  PHQ-9 is 5.  9. At risk for complication associated with hypotension Sarah Wong is at risk for hypotension with weight loss.  Assessment/Plan:   1. Other fatigue Sarah Wong does feel that her weight is causing her energy to be lower than it should be. Fatigue may be related to obesity, depression or many other causes. Labs will be ordered, and in the meanwhile, Sarah Wong will focus on self care including making healthy food choices, increasing physical activity and focusing on stress reduction. - EKG 12-Lead - CBC with Differential/Platelet - VITAMIN D 25 Hydroxy (Vit-D Deficiency, Fractures) - Vitamin B12 - Folate - T3, free - T4, free - TSH  2. Shortness of breath on exertion Sarah Wong does feel that she gets out of breath more easily that she used to when she exercises. Sarah Wong's shortness of breath appears to be obesity related and exercise induced. She has agreed to work on weight loss and gradually increase exercise to treat her exercise induced shortness of breath. Will continue to monitor closely.  3. Essential hypertension Sarah Wong is working on healthy weight loss and exercise to improve blood pressure  control. We will watch for signs of hypotension as she continues her lifestyle modifications.  Continue medications per PCP and cardiologist.  Check labs, prudent nutritional plan, weight loss. - Comprehensive metabolic panel  4. Hyperglycemia Fasting labs will be obtained and results with be discussed with Sarah Wong in 2 weeks at her follow up visit. In the meanwhile Sarah Wong was started on a lower simple carbohydrate diet and will work on weight loss efforts.  Check labs, prudent nutritional plan, weight loss. - Hemoglobin A1c - Insulin, random  5. Chronic chest pain Check labs, prudent nutritional plan, weight loss.  Follow-up with cardiologist in the near future or sooner if concerns arise.  6. OSA (obstructive sleep apnea) Check labs, prudent nutritional plan, weight loss.  Intensive lifestyle modifications are the first line treatment for this issue. We discussed several lifestyle modifications today and she will continue to work on diet, exercise and weight loss efforts. We will continue to monitor. Orders and follow up as documented in patient record.   Counseling  Sleep apnea is a condition in  which breathing pauses or becomes shallow during sleep. This happens over and over during the night. This disrupts your sleep and keeps your body from getting the rest that it needs, which can cause tiredness and lack of energy (fatigue) during the day.  Sleep apnea treatment: If you were given a device to open your airway while you sleep, USE IT!  Sleep hygiene:   Limit or avoid alcohol, caffeinated beverages, and cigarettes, especially close to bedtime.   Do not eat a large meal or eat spicy foods right before bedtime. This can lead to digestive discomfort that can make it hard for you to sleep.  Keep a sleep diary to help you and your health care provider figure out what could be causing your insomnia.   Make your bedroom a dark, comfortable place where it is easy to fall asleep. ? Put up  shades or blackout curtains to block light from outside. ? Use a white noise machine to block noise. ? Keep the temperature cool.  Limit screen use before bedtime. This includes: ? Watching TV. ? Using your smartphone, tablet, or computer.  Stick to a routine that includes going to bed and waking up at the same times every day and night. This can help you fall asleep faster. Consider making a quiet activity, such as reading, part of your nighttime routine.  Try to avoid taking naps during the day so that you sleep better at night.  Get out of bed if you are still awake after 15 minutes of trying to sleep. Keep the lights down, but try reading or doing a quiet activity. When you feel sleepy, go back to bed.  7. Hyperlipidemia, unspecified hyperlipidemia type Cardiovascular risk and specific lipid/LDL goals reviewed.  We discussed several lifestyle modifications today and Sarah Wong will continue to work on diet, exercise and weight loss efforts. Orders and follow up as documented in patient record.  Check labs, prudent nutritional plan, weight loss.  Counseling Intensive lifestyle modifications are the first line treatment for this issue.  Dietary changes: Increase soluble fiber. Decrease simple carbohydrates.  Exercise changes: Moderate to vigorous-intensity aerobic activity 150 minutes per week if tolerated.  Lipid-lowering medications: see documented in medical record. - Lipid panel  8. Depression screening Sarah Wong had a positive depression screening. Depression is commonly associated with obesity and often results in emotional eating behaviors. We will monitor this closely and work on CBT to help improve the non-hunger eating patterns. Referral to Psychology may be required if no improvement is seen as she continues in our clinic.  9. At risk for complication associated with hypotension Sarah Wong was given approximately 15 minutes of education and counseling today to help avoid hypotension.  We discussed risks of hypotension with weight loss and signs of hypotension such as feeling lightheaded or unsteady.  Repetitive spaced learning was employed today to elicit superior memory formation and behavioral change.  10. Class 1 obesity with serious comorbidity and body mass index (BMI) of 30.0 to 30.9 in adult, unspecified obesity type Sarah Wong is currently in the action stage of change and her goal is to continue with weight loss efforts. I recommend Sarah Wong begin the structured treatment plan as follows:  She has agreed to the Category 1 Plan.  Exercise goals: As is.   Behavioral modification strategies: decreasing simple carbohydrates, increasing water intake and planning for success.  She was informed of the importance of frequent follow-up visits to maximize her success with intensive lifestyle modifications for her multiple health conditions.  She was informed we would discuss her lab results at her next visit unless there is a critical issue that needs to be addressed sooner. Sarah Wong agreed to keep her next visit at the agreed upon time to discuss these results.  Objective:   Blood pressure 123/63, pulse (!) 43, temperature 97.9 F (36.6 C), temperature source Oral, height 5\' 1"  (1.549 m), weight 160 lb (72.6 kg), SpO2 97 %. Body mass index is 30.23 kg/m.  EKG: Normal sinus rhythm, rate 45 bpm.  Indirect Calorimeter completed today shows a VO2 of 135 and a REE of 936.  Her calculated basal metabolic rate is 1791 thus her basal metabolic rate is worse than expected.  General: Cooperative, alert, well developed, in no acute distress. HEENT: Conjunctivae and lids unremarkable. Cardiovascular: Regular rhythm.  Lungs: Normal work of breathing. Neurologic: No focal deficits.   Lab Results  Component Value Date   CREATININE 0.95 07/16/2019   BUN 15 07/16/2019   NA 144 07/16/2019   K 4.7 07/16/2019   CL 102 07/16/2019   CO2 29 07/16/2019   Lab Results  Component Value Date     ALT 16 07/16/2019   AST 19 07/16/2019   ALKPHOS 67 07/16/2019   BILITOT 0.5 07/16/2019   Lab Results  Component Value Date   HGBA1C 5.5 07/16/2019   HGBA1C 5.4 07/15/2019   HGBA1C 5.4 12/12/2018   Lab Results  Component Value Date   TSH 2.540 07/16/2019   Lab Results  Component Value Date   CHOL 226 (H) 07/16/2019   HDL 58 07/16/2019   LDLCALC 136 (H) 07/16/2019   TRIG 182 (H) 07/16/2019   CHOLHDL 3.9 07/16/2019   Lab Results  Component Value Date   WBC 7.2 07/16/2019   HGB 14.5 07/16/2019   HCT 43.9 07/16/2019   MCV 94 07/16/2019   PLT 200 07/16/2019   Attestation Statements:   Reviewed by clinician on day of visit: allergies, medications, problem list, medical history, surgical history, family history, social history, and previous encounter notes.  I, Water quality scientist, CMA, am acting as Location manager for [employer], DO.  I have reviewed the above documentation for accuracy and completeness, and I agree with the above. Mellody Dance, DO

## 2019-07-17 LAB — LIPID PANEL
Chol/HDL Ratio: 3.9 ratio (ref 0.0–4.4)
Cholesterol, Total: 226 mg/dL — ABNORMAL HIGH (ref 100–199)
HDL: 58 mg/dL (ref >39)
LDL Chol Calc (NIH): 136 mg/dL — ABNORMAL HIGH (ref 0–99)
Triglycerides: 182 mg/dL — ABNORMAL HIGH (ref 0–149)
VLDL Cholesterol Cal: 32 mg/dL (ref 5–40)

## 2019-07-17 LAB — COMPREHENSIVE METABOLIC PANEL
ALT: 16 IU/L (ref 0–32)
AST: 19 IU/L (ref 0–40)
Albumin/Globulin Ratio: 1.7 (ref 1.2–2.2)
Albumin: 4.7 g/dL (ref 3.8–4.9)
Alkaline Phosphatase: 67 IU/L (ref 48–121)
BUN/Creatinine Ratio: 16 (ref 9–23)
BUN: 15 mg/dL (ref 6–24)
Bilirubin Total: 0.5 mg/dL (ref 0.0–1.2)
CO2: 29 mmol/L (ref 20–29)
Calcium: 9.7 mg/dL (ref 8.7–10.2)
Chloride: 102 mmol/L (ref 96–106)
Creatinine, Ser: 0.95 mg/dL (ref 0.57–1.00)
GFR calc Af Amer: 77 mL/min/{1.73_m2} (ref >59)
GFR calc non Af Amer: 67 mL/min/{1.73_m2} (ref >59)
Globulin, Total: 2.8 g/dL (ref 1.5–4.5)
Glucose: 103 mg/dL — ABNORMAL HIGH (ref 65–99)
Potassium: 4.7 mmol/L (ref 3.5–5.2)
Sodium: 144 mmol/L (ref 134–144)
Total Protein: 7.5 g/dL (ref 6.0–8.5)

## 2019-07-17 LAB — CBC WITH DIFFERENTIAL/PLATELET
Basophils Absolute: 0.1 10*3/uL (ref 0.0–0.2)
Basos: 1 %
EOS (ABSOLUTE): 0.1 10*3/uL (ref 0.0–0.4)
Eos: 1 %
Hematocrit: 43.9 % (ref 34.0–46.6)
Hemoglobin: 14.5 g/dL (ref 11.1–15.9)
Immature Grans (Abs): 0 10*3/uL (ref 0.0–0.1)
Immature Granulocytes: 0 %
Lymphocytes Absolute: 2.9 10*3/uL (ref 0.7–3.1)
Lymphs: 40 %
MCH: 31.2 pg (ref 26.6–33.0)
MCHC: 33 g/dL (ref 31.5–35.7)
MCV: 94 fL (ref 79–97)
Monocytes Absolute: 0.3 10*3/uL (ref 0.1–0.9)
Monocytes: 5 %
Neutrophils Absolute: 3.8 10*3/uL (ref 1.4–7.0)
Neutrophils: 53 %
Platelets: 200 10*3/uL (ref 150–450)
RBC: 4.65 x10E6/uL (ref 3.77–5.28)
RDW: 12.2 % (ref 11.7–15.4)
WBC: 7.2 10*3/uL (ref 3.4–10.8)

## 2019-07-17 LAB — T4, FREE: Free T4: 1.33 ng/dL (ref 0.82–1.77)

## 2019-07-17 LAB — HEMOGLOBIN A1C
Est. average glucose Bld gHb Est-mCnc: 111 mg/dL
Hgb A1c MFr Bld: 5.5 % (ref 4.8–5.6)

## 2019-07-17 LAB — TSH: TSH: 2.54 u[IU]/mL (ref 0.450–4.500)

## 2019-07-17 LAB — FOLATE: Folate: 5.6 ng/mL (ref >3.0)

## 2019-07-17 LAB — INSULIN, RANDOM: INSULIN: 22.4 u[IU]/mL (ref 2.6–24.9)

## 2019-07-17 LAB — VITAMIN B12: Vitamin B-12: 169 pg/mL — ABNORMAL LOW (ref 232–1245)

## 2019-07-17 LAB — VITAMIN D 25 HYDROXY (VIT D DEFICIENCY, FRACTURES): Vit D, 25-Hydroxy: 26.4 ng/mL — ABNORMAL LOW (ref 30.0–100.0)

## 2019-07-17 LAB — T3, FREE: T3, Free: 3.4 pg/mL (ref 2.0–4.4)

## 2019-07-30 ENCOUNTER — Ambulatory Visit (INDEPENDENT_AMBULATORY_CARE_PROVIDER_SITE_OTHER): Payer: 59 | Admitting: Family Medicine

## 2019-07-30 ENCOUNTER — Encounter (INDEPENDENT_AMBULATORY_CARE_PROVIDER_SITE_OTHER): Payer: Self-pay | Admitting: Family Medicine

## 2019-07-30 ENCOUNTER — Other Ambulatory Visit: Payer: Self-pay

## 2019-07-30 VITALS — BP 118/63 | HR 51 | Temp 97.7°F | Ht 61.0 in | Wt 160.0 lb

## 2019-07-30 DIAGNOSIS — E538 Deficiency of other specified B group vitamins: Secondary | ICD-10-CM

## 2019-07-30 DIAGNOSIS — E7849 Other hyperlipidemia: Secondary | ICD-10-CM | POA: Diagnosis not present

## 2019-07-30 DIAGNOSIS — Z6830 Body mass index (BMI) 30.0-30.9, adult: Secondary | ICD-10-CM

## 2019-07-30 DIAGNOSIS — E559 Vitamin D deficiency, unspecified: Secondary | ICD-10-CM | POA: Diagnosis not present

## 2019-07-30 DIAGNOSIS — Z9189 Other specified personal risk factors, not elsewhere classified: Secondary | ICD-10-CM

## 2019-07-30 DIAGNOSIS — E669 Obesity, unspecified: Secondary | ICD-10-CM

## 2019-07-30 DIAGNOSIS — E8881 Metabolic syndrome: Secondary | ICD-10-CM

## 2019-07-30 DIAGNOSIS — I10 Essential (primary) hypertension: Secondary | ICD-10-CM

## 2019-07-30 MED ORDER — VITAMIN D (ERGOCALCIFEROL) 1.25 MG (50000 UNIT) PO CAPS: 50000.0000 [IU] | ORAL_CAPSULE | ORAL | 0 refills | Status: DC

## 2019-07-30 NOTE — Progress Notes (Signed)
Chief Complaint:   OBESITY Sarah Wong is here to discuss her progress with her obesity treatment plan along with follow-up of her obesity related diagnoses. Sarah Wong is on the Category 1 Plan and states she is following her eating plan approximately 50% of the time. Sarah Wong states she is doing some walking for exercise.  Today's visit was #: 2 Starting weight: 160 lbs Starting date: 07/16/2019 Today's weight: 160 lbs Today's date: 08/03/2019 Total lbs lost to date: 0 Total lbs lost since last in-office visit: 0  Interim History: Sarah Wong is here for her first follow-up visit.  She says she has had some stressful events in her life lately.  She reports that it has been difficult to stay on plan.  Her daughter is due August 17th.  She has questions about the diet, substitutions, and what kinds of fruits are acceptable.  Subjective:   1. Insulin resistance Sarah Wong has a diagnosis of insulin resistance based on her elevated fasting insulin level >5. She continues to work on diet and exercise to decrease her risk of diabetes.  Sarah Wong is hyperglycemic at 103.  Lab Results  Component Value Date   INSULIN 22.4 07/16/2019   Lab Results  Component Value Date   HGBA1C 5.5 07/16/2019   2. Other hyperlipidemia Sarah Wong has hyperlipidemia and has been trying to improve her cholesterol levels with intensive lifestyle modification including a low saturated fat diet, exercise and weight loss. She denies any chest pain, claudication or myalgias.  She sees Dr. Jobe Igo in Cardiology once a year.  She says that Cardiology took her off her medication a couple of years ago.  Lab Results  Component Value Date   ALT 16 07/16/2019   AST 19 07/16/2019   ALKPHOS 67 07/16/2019   BILITOT 0.5 07/16/2019   Lab Results  Component Value Date   CHOL 226 (H) 07/16/2019   HDL 58 07/16/2019   LDLCALC 136 (H) 07/16/2019   TRIG 182 (H) 07/16/2019   CHOLHDL 3.9 07/16/2019   3. Essential hypertension Review: taking  medications as instructed, no medication side effects noted, no chest pain on exertion, no dyspnea on exertion, no swelling of ankles.   BP Readings from Last 3 Encounters:  07/30/19 (!) 118/63  07/16/19 123/63  07/15/19 126/68   4. Vitamin D deficiency Sarah Wong's Vitamin D level was 26.4 on 07/16/2019. She is currently taking no vitamin D supplement. She denies nausea, vomiting or muscle weakness.  She endorses fatigue and tiredness.  5. Vitamin B 12 deficiency She notes fatigue and tiredness. She is not a vegetarian.  She does not have a previous diagnosis of pernicious anemia.  She does not have a history of weight loss surgery.   Lab Results  Component Value Date   VITAMINB12 169 (L) 07/16/2019   6. At risk for diabetes mellitus Sarah Wong is at higher than average risk for developing diabetes due to her obesity.   Assessment/Plan:   1. Insulin resistance Discussed labs with patient today.  Sarah Wong will continue to work on weight loss, exercise, and decreasing simple carbohydrates to help decrease the risk of diabetes. Sarah Wong agreed to follow-up with [place] as directed to closely monitor her progress.    2. Other hyperlipidemia Discussed labs with patient today.  Cardiovascular risk and specific lipid/LDL goals reviewed.  We discussed several lifestyle modifications today and Sarah Wong will continue to work on diet, exercise and weight loss efforts. Orders and follow up as documented in patient record.  She was told to speak  with her cardiologist about possible treatment options.  She has an appointment in September 2021.  Continue prudent nutritional plan, weight loss, and eventually increase exercise.  Counseling Intensive lifestyle modifications are the first line treatment for this issue. . Dietary changes: Increase soluble fiber. Decrease simple carbohydrates. . Exercise changes: Moderate to vigorous-intensity aerobic activity 150 minutes per week if tolerated. . Lipid-lowering medications:  see documented in medical record.  3. Essential hypertension Discussed labs with patient today.  Sarah Wong is working on healthy weight loss and exercise to improve blood pressure control. We will watch for signs of hypotension as she continues her lifestyle modifications.  Blood pressure is at goal.  Continue prudent nutritional plan, weight loss.  4. Vitamin D deficiency Discussed labs with patient today.  Low Vitamin D level contributes to fatigue and are associated with obesity, breast, and colon cancer. She agrees to start to take prescription Vitamin D @50 ,000 IU every week and will follow-up for routine testing of Vitamin D, at least 2-3 times per year to avoid over-replacement.  Recheck vitamin D level in 3 months. - Vitamin D, Ergocalciferol, (DRISDOL) 1.25 MG (50000 UNIT) CAPS capsule; Take 1 capsule (50,000 Units total) by mouth every 7 (seven) days.  Dispense: 4 capsule; Refill: 0  5. Vitamin B 12 deficiency Discussed labs with patient today.  The diagnosis was reviewed with the patient. Counseling provided today, see below. We will continue to monitor. Orders and follow up as documented in patient record.  Start OTC vitamin B12 500 mcg daily.  Recheck level in 3 months.  Counseling . The body needs vitamin B12: to make red blood cells; to make DNA; and to help the nerves work properly so they can carry messages from the brain to the body.  . The main causes of vitamin B12 deficiency include dietary deficiency, digestive diseases, pernicious anemia, and having a surgery in which part of the stomach or small intestine is removed.  . Certain medicines can make it harder for the body to absorb vitamin B12. These medicines include: heartburn medications; some antibiotics; some medications used to treat diabetes, gout, and high cholesterol.  . In some cases, there are no symptoms of this condition. If the condition leads to anemia or nerve damage, various symptoms can occur, such as weakness or  fatigue, shortness of breath, and numbness or tingling in your hands and feet.   . Treatment:  o May include taking vitamin B12 supplements.  o Avoid alcohol.  o Eat lots of healthy foods that contain vitamin B12: - Beef, pork, chicken, [place], and organ meats, such as liver.  - Seafood: This includes clams, rainbow trout, salmon, tuna, and haddock. Eggs.  - Cereal and dairy products that are fortified: This means that vitamin B12 has been added to the food.   6. At risk for diabetes mellitus Sarah Wong was given approximately 15 minutes of diabetes education and counseling today. We discussed intensive lifestyle modifications today with an emphasis on weight loss as well as increasing exercise and decreasing simple carbohydrates in her diet. We also reviewed medication options with an emphasis on risk versus benefit of those discussed.   Repetitive spaced learning was employed today to elicit superior memory formation and behavioral change.  7. Class 1 obesity with serious comorbidity and body mass index (BMI) of 30.0 to 30.9 in adult, unspecified obesity type Sarah Wong is currently in the action stage of change. As such, her goal is to continue with weight loss efforts. She has agreed to  the Category 1 Plan.   Exercise goals: As is.  Behavioral modification strategies: meal planning and cooking strategies, keeping healthy foods in the home and planning for success.  Sarah Wong has agreed to follow-up with our clinic in 2 weeks. She was informed of the importance of frequent follow-up visits to maximize her success with intensive lifestyle modifications for her multiple health conditions.   Objective:   Blood pressure (!) 118/63, pulse 51, temperature 97.7 F (36.5 C), temperature source Oral, height 5\' 1"  (1.549 m), weight 160 lb (72.6 kg), SpO2 97 %. Body mass index is 30.23 kg/m.  General: Cooperative, alert, well developed, in no acute distress. HEENT: Conjunctivae and lids  unremarkable. Cardiovascular: Regular rhythm.  Lungs: Normal work of breathing. Neurologic: No focal deficits.   Lab Results  Component Value Date   CREATININE 0.95 07/16/2019   BUN 15 07/16/2019   NA 144 07/16/2019   K 4.7 07/16/2019   CL 102 07/16/2019   CO2 29 07/16/2019   Lab Results  Component Value Date   ALT 16 07/16/2019   AST 19 07/16/2019   ALKPHOS 67 07/16/2019   BILITOT 0.5 07/16/2019   Lab Results  Component Value Date   HGBA1C 5.5 07/16/2019   HGBA1C 5.4 07/15/2019   HGBA1C 5.4 12/12/2018   Lab Results  Component Value Date   INSULIN 22.4 07/16/2019   Lab Results  Component Value Date   TSH 2.540 07/16/2019   Lab Results  Component Value Date   CHOL 226 (H) 07/16/2019   HDL 58 07/16/2019   LDLCALC 136 (H) 07/16/2019   TRIG 182 (H) 07/16/2019   CHOLHDL 3.9 07/16/2019   Lab Results  Component Value Date   WBC 7.2 07/16/2019   HGB 14.5 07/16/2019   HCT 43.9 07/16/2019   MCV 94 07/16/2019   PLT 200 07/16/2019   Attestation Statements:   Reviewed by clinician on day of visit: allergies, medications, problem list, medical history, surgical history, family history, social history, and previous encounter notes.  I, Water quality scientist, CMA, am acting as Location manager for [employer], DO.  I have reviewed the above documentation for accuracy and completeness, and I agree with the above. Mellody Dance, DO

## 2019-08-12 ENCOUNTER — Encounter (INDEPENDENT_AMBULATORY_CARE_PROVIDER_SITE_OTHER): Payer: Self-pay | Admitting: Family Medicine

## 2019-08-12 ENCOUNTER — Other Ambulatory Visit: Payer: Self-pay

## 2019-08-12 ENCOUNTER — Ambulatory Visit (INDEPENDENT_AMBULATORY_CARE_PROVIDER_SITE_OTHER): Payer: 59 | Admitting: Family Medicine

## 2019-08-12 VITALS — BP 117/76 | HR 54 | Temp 98.1°F | Ht 61.0 in | Wt 158.0 lb

## 2019-08-12 DIAGNOSIS — E669 Obesity, unspecified: Secondary | ICD-10-CM | POA: Diagnosis not present

## 2019-08-12 DIAGNOSIS — Z9189 Other specified personal risk factors, not elsewhere classified: Secondary | ICD-10-CM | POA: Diagnosis not present

## 2019-08-12 DIAGNOSIS — E559 Vitamin D deficiency, unspecified: Secondary | ICD-10-CM | POA: Diagnosis not present

## 2019-08-12 DIAGNOSIS — I10 Essential (primary) hypertension: Secondary | ICD-10-CM

## 2019-08-12 DIAGNOSIS — Z6830 Body mass index (BMI) 30.0-30.9, adult: Secondary | ICD-10-CM

## 2019-08-12 MED ORDER — VITAMIN D (ERGOCALCIFEROL) 1.25 MG (50000 UNIT) PO CAPS: 50000.0000 [IU] | ORAL_CAPSULE | ORAL | 0 refills | Status: DC

## 2019-08-13 NOTE — Progress Notes (Signed)
Chief Complaint:   OBESITY Sarah Wong is here to discuss her progress with her obesity treatment plan along with follow-up of her obesity related diagnoses. Sarah Wong is on the Category 1 Plan and states she is following her eating plan approximately 90-100% of the time. Sarah Wong states she is walking for 30 minutes 2 times per week.  Today's visit was #: 3 Starting weight: 160 lbs Starting date: 07/16/2019 Today's weight: 158 lbs Today's date: 08/12/2019 Total lbs lost to date: 2 lbs Total lbs lost since last in-office visit: 2 lbs  Interim History: Sarah Wong says that for breakfast she stuck with the plan.  Lunch has been microwave meals with fruit.  Dinner is ground [place], Loss adjuster, chartered, Social research officer, government.  She has been drinking more water as well.  She says she had a family gathering and ate off plan this past Sunday.  Otherwise, she has no concerns with hunger and cravings.  Subjective:   1. Essential hypertension Review: taking medications as instructed, no medication side effects noted, no chest pain on exertion, no dyspnea on exertion, no swelling of ankles.  She is taking diltiazem, Lasix, and Toprol per Cardiology.  At last office visit, blood pressure was elevated.  Now within normal limits.  No complaints or concerns today.  BP Readings from Last 3 Encounters:  08/12/19 117/76  07/30/19 (!) 118/63  07/16/19 123/63   2. Vitamin D deficiency Sarah Wong's Vitamin D level was 26.4 on 07/16/2019. She is currently taking prescription vitamin D 50,000 IU each week. She denies nausea, vomiting or muscle weakness.  3. At risk for dehydration Sarah Wong is at risk for dehydration due to summer.  Assessment/Plan:   1. Essential hypertension Sarah Wong is working on healthy weight loss and exercise to improve blood pressure control. We will watch for signs of hypotension as she continues her lifestyle modifications.  Continue medications per cardiology.  Blood pressure is controlled.  Continue prudent nutritional plan,  weight loss.  2. Vitamin D deficiency Low Vitamin D level contributes to fatigue and are associated with obesity, breast, and colon cancer. She agrees to continue to take prescription Vitamin D @50 ,000 IU every week and will follow-up for routine testing of Vitamin D, at least 2-3 times per year to avoid over-replacement. - Vitamin D, Ergocalciferol, (DRISDOL) 1.25 MG (50000 UNIT) CAPS capsule; Take 1 capsule (50,000 Units total) by mouth every 7 (seven) days.  Dispense: 4 capsule; Refill: 0  3. At risk for dehydration Sarah Wong was given approximately 15 minutes dehydration prevention counseling today. Sarah Wong is at risk for dehydration due to weight loss and current medication(s). She was encouraged to hydrate and monitor fluid status to avoid dehydration as well as weight loss plateaus.   4. Class 1 obesity with serious comorbidity and body mass index (BMI) of 30.0 to 30.9 in adult, unspecified obesity type Sarah Wong is currently in the action stage of change. As such, her goal is to continue with weight loss efforts. She has agreed to the Category 1 Plan.   Exercise goals: As is.  Behavioral modification strategies: increasing lean protein intake, decreasing simple carbohydrates, keeping healthy foods in the home and planning for success.  Sarah Wong has agreed to follow-up with our clinic in 2 weeks. She was informed of the importance of frequent follow-up visits to maximize her success with intensive lifestyle modifications for her multiple health conditions.   Objective:   Blood pressure 117/76, pulse (!) 54, temperature 98.1 F (36.7 C), temperature source Oral, height 5\' 1"  (1.549 m), weight  158 lb (71.7 kg), SpO2 97 %. Body mass index is 29.85 kg/m.  General: Cooperative, alert, well developed, in no acute distress. HEENT: Conjunctivae and lids unremarkable. Cardiovascular: Regular rhythm.  Lungs: Normal work of breathing. Neurologic: No focal deficits.   Lab Results  Component Value  Date   CREATININE 0.95 07/16/2019   BUN 15 07/16/2019   NA 144 07/16/2019   K 4.7 07/16/2019   CL 102 07/16/2019   CO2 29 07/16/2019   Lab Results  Component Value Date   ALT 16 07/16/2019   AST 19 07/16/2019   ALKPHOS 67 07/16/2019   BILITOT 0.5 07/16/2019   Lab Results  Component Value Date   HGBA1C 5.5 07/16/2019   HGBA1C 5.4 07/15/2019   HGBA1C 5.4 12/12/2018   Lab Results  Component Value Date   INSULIN 22.4 07/16/2019   Lab Results  Component Value Date   TSH 2.540 07/16/2019   Lab Results  Component Value Date   CHOL 226 (H) 07/16/2019   HDL 58 07/16/2019   LDLCALC 136 (H) 07/16/2019   TRIG 182 (H) 07/16/2019   CHOLHDL 3.9 07/16/2019   Lab Results  Component Value Date   WBC 7.2 07/16/2019   HGB 14.5 07/16/2019   HCT 43.9 07/16/2019   MCV 94 07/16/2019   PLT 200 07/16/2019   Attestation Statements:   Reviewed by clinician on day of visit: allergies, medications, problem list, medical history, surgical history, family history, social history, and previous encounter notes.  I, Water quality scientist, CMA, am acting as Location manager for [employer], DO.  I have reviewed the above documentation for accuracy and completeness, and I agree with the above. Mellody Dance, DO

## 2019-08-23 ENCOUNTER — Other Ambulatory Visit (INDEPENDENT_AMBULATORY_CARE_PROVIDER_SITE_OTHER): Payer: Self-pay | Admitting: Family Medicine

## 2019-08-23 DIAGNOSIS — E559 Vitamin D deficiency, unspecified: Secondary | ICD-10-CM

## 2019-08-26 ENCOUNTER — Ambulatory Visit (INDEPENDENT_AMBULATORY_CARE_PROVIDER_SITE_OTHER): Payer: 59 | Admitting: Family Medicine

## 2019-08-26 ENCOUNTER — Encounter (INDEPENDENT_AMBULATORY_CARE_PROVIDER_SITE_OTHER): Payer: Self-pay | Admitting: Family Medicine

## 2019-08-26 ENCOUNTER — Other Ambulatory Visit: Payer: Self-pay

## 2019-08-26 VITALS — BP 108/54 | HR 47 | Temp 98.2°F | Ht 61.0 in | Wt 160.0 lb

## 2019-08-26 DIAGNOSIS — E8881 Metabolic syndrome: Secondary | ICD-10-CM

## 2019-08-26 DIAGNOSIS — I10 Essential (primary) hypertension: Secondary | ICD-10-CM | POA: Diagnosis not present

## 2019-08-26 DIAGNOSIS — E559 Vitamin D deficiency, unspecified: Secondary | ICD-10-CM

## 2019-08-26 DIAGNOSIS — E88819 Insulin resistance, unspecified: Secondary | ICD-10-CM

## 2019-08-26 DIAGNOSIS — Z9189 Other specified personal risk factors, not elsewhere classified: Secondary | ICD-10-CM | POA: Diagnosis not present

## 2019-08-26 DIAGNOSIS — Z6830 Body mass index (BMI) 30.0-30.9, adult: Secondary | ICD-10-CM

## 2019-08-26 DIAGNOSIS — E669 Obesity, unspecified: Secondary | ICD-10-CM

## 2019-08-26 MED ORDER — VITAMIN D (ERGOCALCIFEROL) 1.25 MG (50000 UNIT) PO CAPS: 50000.0000 [IU] | ORAL_CAPSULE | ORAL | 0 refills | Status: DC

## 2019-08-27 NOTE — Progress Notes (Addendum)
Chief Complaint:   OBESITY Sarah Wong is here to discuss her progress with her obesity treatment plan along with follow-up of her obesity related diagnoses. Sarah Wong is on the Category 1 Plan and states she is following her eating plan approximately 40% of the time. Sarah Wong states she is walking for 30 minutes 2 times per week.  Today's visit was #: 4 Starting weight: 160 lbs Starting date: 07/16/2019 Today's weight: 160 lbs Today's date: 08/26/2019 Total lbs lost to date: 0 Total lbs lost since last in-office visit: 0  Interim History: Sarah Wong says she was running back and forth to her daughter's house because her daughter had a new baby boy recently.  She did not plan or prep meals and ate off plan a lot of the past couple of weeks.  She has been back home finally since 3 days ago.  She is eating most of the foods on the plan, but a lot of extra calories - 2 sodas per day at 240 calories and 65 grams of sugar.  Subjective:   1. Insulin resistance Sarah Wong has a diagnosis of insulin resistance based on her elevated fasting insulin level >5. She continues to work on diet and exercise to decrease her risk of diabetes.  She drinks 2 cans of Coke and Pepsi per day.  We discussed 1/2 Coke Zero and 1/2 regular, but she has not tried to ween them.  Lab Results  Component Value Date   INSULIN 22.4 07/16/2019   Lab Results  Component Value Date   HGBA1C 5.5 07/16/2019   2. Essential hypertension Review: taking medications as instructed, no medication side effects noted, no chest pain on exertion, no dyspnea on exertion, no swelling of ankles.  Denies dizziness/lightheadedness and blood pressure is well controlled at home with no concerns with medications.  BP Readings from Last 3 Encounters:  08/26/19 (!) 108/54  08/12/19 117/76  07/30/19 (!) 118/63   3. Vitamin D deficiency Sarah Wong's Vitamin D level was 26.4 on 07/16/2019. She is currently taking prescription vitamin D 50,000 IU each week.  She denies nausea, vomiting or muscle weakness.  4. At risk for hyperglycemia Sarah Wong is at risk for hyperglycemia due to too many carbs/simple sugars in her diet.  Assessment/Plan:   1. Insulin resistance Sarah Wong will continue to work on weight loss, exercise, and decreasing simple carbohydrates to help decrease the risk of diabetes. Sarah Wong agreed to follow-up with [place] as directed to closely monitor her progress.  Continue prudent nutritional plan, decrease simple sugars, decrease Coke, etc., (highly encouraged to change to calorie-free), weight loss, recheck labs in 3 months.  2. Essential hypertension Sarah Wong is working on healthy weight loss and exercise to improve blood pressure control. We will watch for signs of hypotension as she continues her lifestyle modifications.  Continue medication per her specialist.  Continue prudent nutritional plan, weight loss, low salt diet.  Will monitor closely.  3. Vitamin D deficiency Low Vitamin D level contributes to fatigue and are associated with obesity, breast, and colon cancer. She agrees to continue to take prescription Vitamin D @50 ,000 IU every week and will follow-up for routine testing of Vitamin D, at least 2-3 times per year to avoid over-replacement.  -Refill Vitamin D, Ergocalciferol, (DRISDOL) 1.25 MG (50000 UNIT) CAPS capsule; Take 1 capsule (50,000 Units total) by mouth every 7 (seven) days.  Dispense: 4 capsule; Refill: 0  4. At risk for hyperglycemia Sarah Wong was given approximately 8+ minutes of counseling today regarding prevention of hyperglycemia.  She was advised of hyperglycemia causes and the fact hyperglycemia is often asymptomatic. Sarah Wong was instructed to avoid skipping meals, eat regular protein rich meals and schedule low calorie but protein rich snacks as needed.   Repetitive spaced learning was employed today to elicit superior memory formation and behavioral change  5. Class 1 obesity with serious comorbidity and body mass  index (BMI) of 30.0 to 30.9 in adult, unspecified obesity type Sarah Wong is currently in the action stage of change. As such, her goal is to continue with weight loss efforts. She has agreed to the Category 1 Plan with breakfast options.   Exercise goals: As is.  Behavioral modification strategies: increasing lean protein intake, decreasing simple carbohydrates, decreasing sodium intake, decreasing eating out, meal planning and cooking strategies, better snacking choices and planning for success.  Sarah Wong has agreed to follow-up with our clinic in 2 weeks. She was informed of the importance of frequent follow-up visits to maximize her success with intensive lifestyle modifications for her multiple health conditions.   Objective:   Blood pressure (!) 108/54, pulse (!) 47, temperature 98.2 F (36.8 C), height 5\' 1"  (1.549 m), weight 160 lb (72.6 kg), SpO2 98 %. Body mass index is 30.23 kg/m.  General: Cooperative, alert, well developed, in no acute distress. HEENT: Conjunctivae and lids unremarkable. Cardiovascular: Regular rhythm.  Lungs: Normal work of breathing. Neurologic: No focal deficits.   Lab Results  Component Value Date   CREATININE 0.95 07/16/2019   BUN 15 07/16/2019   NA 144 07/16/2019   K 4.7 07/16/2019   CL 102 07/16/2019   CO2 29 07/16/2019   Lab Results  Component Value Date   ALT 16 07/16/2019   AST 19 07/16/2019   ALKPHOS 67 07/16/2019   BILITOT 0.5 07/16/2019   Lab Results  Component Value Date   HGBA1C 5.5 07/16/2019   HGBA1C 5.4 07/15/2019   HGBA1C 5.4 12/12/2018   Lab Results  Component Value Date   INSULIN 22.4 07/16/2019   Lab Results  Component Value Date   TSH 2.540 07/16/2019   Lab Results  Component Value Date   CHOL 226 (H) 07/16/2019   HDL 58 07/16/2019   LDLCALC 136 (H) 07/16/2019   TRIG 182 (H) 07/16/2019   CHOLHDL 3.9 07/16/2019   Lab Results  Component Value Date   WBC 7.2 07/16/2019   HGB 14.5 07/16/2019   HCT 43.9  07/16/2019   MCV 94 07/16/2019   PLT 200 07/16/2019   Attestation Statements:   Reviewed by clinician on day of visit: allergies, medications, problem list, medical history, surgical history, family history, social history, and previous encounter notes.  I, Water quality scientist, CMA, am acting as Location manager for [employer], DO.  I have reviewed the above documentation for accuracy and completeness, and I agree with the above. Mellody Dance, DO

## 2019-09-09 ENCOUNTER — Encounter (INDEPENDENT_AMBULATORY_CARE_PROVIDER_SITE_OTHER): Payer: Self-pay | Admitting: Family Medicine

## 2019-09-09 ENCOUNTER — Other Ambulatory Visit: Payer: Self-pay

## 2019-09-09 ENCOUNTER — Ambulatory Visit (INDEPENDENT_AMBULATORY_CARE_PROVIDER_SITE_OTHER): Payer: 59 | Admitting: Family Medicine

## 2019-09-09 VITALS — BP 132/68 | HR 45 | Temp 97.8°F | Ht 61.0 in | Wt 157.0 lb

## 2019-09-09 DIAGNOSIS — E559 Vitamin D deficiency, unspecified: Secondary | ICD-10-CM | POA: Diagnosis not present

## 2019-09-09 DIAGNOSIS — E669 Obesity, unspecified: Secondary | ICD-10-CM | POA: Diagnosis not present

## 2019-09-09 DIAGNOSIS — Z6830 Body mass index (BMI) 30.0-30.9, adult: Secondary | ICD-10-CM

## 2019-09-09 DIAGNOSIS — I10 Essential (primary) hypertension: Secondary | ICD-10-CM

## 2019-09-09 DIAGNOSIS — E538 Deficiency of other specified B group vitamins: Secondary | ICD-10-CM | POA: Diagnosis not present

## 2019-09-10 NOTE — Progress Notes (Signed)
Chief Complaint:   OBESITY Sarah Wong is here to discuss her progress with her obesity treatment plan along with follow-up of her obesity related diagnoses. Sarah Wong is on the Category 1 Plan and states she is following her eating plan approximately 80% of the time. Sarah Wong states she is walking for 30 minutes 2 times per week.  Today's visit was #: 5 Starting weight: 160 lbs Starting date: 07/16/2019 Today's weight: 157 lbs Today's date: 09/09/2019 Total lbs lost to date: 3 lbs Total lbs lost since last in-office visit: 3 lbs  Interim History: Sarah Wong says she had increased stress yesterday as she heard her other daughter's unborn child may have a genetic abnormality, found a cyst on brain.  Increased stress for patient has caused her to have a decreased appetite over the last 2 days.  She is able to get all 4 ounce and 6 ounces in of protein per day.  She is drinking diet soda much less.  She is drinking more water as well.  For snack calories, she is having [employer] popcorn (creating her own snack bags) or 100-calorie granola bar or chocolate wafers.  Subjective:   1. Vitamin B 12 deficiency She is not a vegetarian.  She does not have a previous diagnosis of pernicious anemia.  She does not have a history of weight loss surgery.  She is taking B12 500 mcg OTC.  She feels she has more energy than prior.  Lab Results  Component Value Date   VITAMINB12 169 (L) 07/16/2019   2. Essential hypertension Review: taking medications as instructed, no medication side effects noted, no chest pain on exertion, no dyspnea on exertion, no swelling of ankles.  Per Cardiology, she is on Toprol XL, Lasix, and diltiazem.  Blood pressure at last office visit was 105/54.  Today, it is much better controlled.  She is without symptoms.  At home, 120s/70s.  No symptoms at all.  BP Readings from Last 3 Encounters:  09/09/19 132/68  08/26/19 (!) 108/54  08/12/19 117/76   3. Vitamin D deficiency Sarah Wong's  Vitamin D level was 26.4 on 07/16/2019. She is currently taking prescription vitamin D 50,000 IU each week on Tuesday. She denies nausea, vomiting or muscle weakness.  Tolerating will without side effects.  Assessment/Plan:   1. Vitamin B 12 deficiency The diagnosis was reviewed with the patient. Counseling provided today, see below. We will continue to monitor. Orders and follow up as documented in patient record.  Recheck levels in 3 months (around 10/16/2019 or so).  Continue daily supplement.  Counseling . The body needs vitamin B12: to make red blood cells; to make DNA; and to help the nerves work properly so they can carry messages from the brain to the body.  . The main causes of vitamin B12 deficiency include dietary deficiency, digestive diseases, pernicious anemia, and having a surgery in which part of the stomach or small intestine is removed.  . Certain medicines can make it harder for the body to absorb vitamin B12. These medicines include: heartburn medications; some antibiotics; some medications used to treat diabetes, gout, and high cholesterol.  . In some cases, there are no symptoms of this condition. If the condition leads to anemia or nerve damage, various symptoms can occur, such as weakness or fatigue, shortness of breath, and numbness or tingling in your hands and feet.   . Treatment:  o May include taking vitamin B12 supplements.  o Avoid alcohol.  o Eat lots of healthy  foods that contain vitamin B12: - Beef, pork, chicken, [place], and organ meats, such as liver.  - Seafood: This includes clams, rainbow trout, salmon, tuna, and haddock. Eggs.  - Cereal and dairy products that are fortified: This means that vitamin B12 has been added to the food.   2. Essential hypertension Sarah Wong is working on healthy weight loss and exercise to improve blood pressure control. We will watch for signs of hypotension as she continues her lifestyle modifications.  Continue mediations per  PCP/Cardiology.  Blood pressure is at goal.  Continue home blood pressure monitoring.  Continue prudent nutritional plan and weight loss.  3. Vitamin D deficiency Low Vitamin D level contributes to fatigue and are associated with obesity, breast, and colon cancer. She agrees to continue to take prescription Vitamin D @50 ,000 IU every week and will follow-up for routine testing of Vitamin D, at least 2-3 times per year to avoid over-replacement.  Recheck level in 3 months.  Continue weight loss.  4. Class 1 obesity with serious comorbidity and body mass index (BMI) of 30.0 to 30.9 in adult, unspecified obesity type Sarah Wong is currently in the action stage of change. As such, her goal is to continue with weight loss efforts. She has agreed to the Category 1 Plan.   Exercise goals: For help with stress, walk 20-30 minutes everyday!  Behavioral modification strategies: increasing lean protein intake, increasing water intake, meal planning and cooking strategies and planning for success.  Sarah Wong has agreed to follow-up with our clinic in 2 weeks. She was informed of the importance of frequent follow-up visits to maximize her success with intensive lifestyle modifications for her multiple health conditions.   Objective:   Blood pressure 132/68, pulse (!) 45, temperature 97.8 F (36.6 C), height 5\' 1"  (1.549 m), weight 157 lb (71.2 kg), SpO2 97 %. Body mass index is 29.66 kg/m.  General: Cooperative, alert, well developed, in no acute distress. HEENT: Conjunctivae and lids unremarkable. Cardiovascular: Regular rhythm.  Lungs: Normal work of breathing. Neurologic: No focal deficits.   Lab Results  Component Value Date   CREATININE 0.95 07/16/2019   BUN 15 07/16/2019   NA 144 07/16/2019   K 4.7 07/16/2019   CL 102 07/16/2019   CO2 29 07/16/2019   Lab Results  Component Value Date   ALT 16 07/16/2019   AST 19 07/16/2019   ALKPHOS 67 07/16/2019   BILITOT 0.5 07/16/2019   Lab Results    Component Value Date   HGBA1C 5.5 07/16/2019   HGBA1C 5.4 07/15/2019   HGBA1C 5.4 12/12/2018   Lab Results  Component Value Date   INSULIN 22.4 07/16/2019   Lab Results  Component Value Date   TSH 2.540 07/16/2019   Lab Results  Component Value Date   CHOL 226 (H) 07/16/2019   HDL 58 07/16/2019   LDLCALC 136 (H) 07/16/2019   TRIG 182 (H) 07/16/2019   CHOLHDL 3.9 07/16/2019   Lab Results  Component Value Date   WBC 7.2 07/16/2019   HGB 14.5 07/16/2019   HCT 43.9 07/16/2019   MCV 94 07/16/2019   PLT 200 07/16/2019   Attestation Statements:   Reviewed by clinician on day of visit: allergies, medications, problem list, medical history, surgical history, family history, social history, and previous encounter notes.  Time spent on visit including pre-visit chart review and post-visit care and charting was 20 minutes.   I, Water quality scientist, CMA, am acting as Location manager for [employer], DO.  I have reviewed the  above documentation for accuracy and completeness, and I agree with the above. Mellody Dance, DO

## 2019-09-23 ENCOUNTER — Ambulatory Visit (INDEPENDENT_AMBULATORY_CARE_PROVIDER_SITE_OTHER): Payer: 59 | Admitting: Family Medicine

## 2019-09-23 ENCOUNTER — Other Ambulatory Visit: Payer: Self-pay

## 2019-09-23 ENCOUNTER — Telehealth (INDEPENDENT_AMBULATORY_CARE_PROVIDER_SITE_OTHER): Payer: Self-pay

## 2019-09-23 ENCOUNTER — Encounter (INDEPENDENT_AMBULATORY_CARE_PROVIDER_SITE_OTHER): Payer: Self-pay | Admitting: Family Medicine

## 2019-09-23 ENCOUNTER — Telehealth (INDEPENDENT_AMBULATORY_CARE_PROVIDER_SITE_OTHER): Payer: 59 | Admitting: Family Medicine

## 2019-09-23 VITALS — Ht 61.0 in | Wt 154.0 lb

## 2019-09-23 DIAGNOSIS — E538 Deficiency of other specified B group vitamins: Secondary | ICD-10-CM | POA: Diagnosis not present

## 2019-09-23 DIAGNOSIS — Z6830 Body mass index (BMI) 30.0-30.9, adult: Secondary | ICD-10-CM | POA: Diagnosis not present

## 2019-09-23 DIAGNOSIS — E669 Obesity, unspecified: Secondary | ICD-10-CM | POA: Diagnosis not present

## 2019-09-23 DIAGNOSIS — E559 Vitamin D deficiency, unspecified: Secondary | ICD-10-CM | POA: Diagnosis not present

## 2019-09-23 NOTE — Telephone Encounter (Signed)
I connected with  Sarah Wong on 09/23/19 by a video enabled telemedicine application and verified that I am speaking with the correct person using two identifiers.   I discussed the limitations of evaluation and management by telemedicine. The patient expressed understanding and agreed to proceed.

## 2019-09-27 NOTE — Progress Notes (Signed)
TeleHealth Visit:  Due to the COVID-19 pandemic, this visit was completed with telemedicine (audio/video) technology to reduce patient and provider exposure as well as to preserve personal protective equipment.   Zamaya has verbally consented to this TeleHealth visit. The patient is located at home, the provider is located at the [facility] office. The participants in this visit include the listed provider and patient. Parneet was unable to use realtime audiovisual technology today and the telehealth visit was conducted via telephone.   Chief Complaint: OBESITY Ronelle is here to discuss her progress with her obesity treatment plan along with follow-up of her obesity related diagnoses. Venia is on the Category 1 Plan and states she is following her eating plan approximately 90% of the time. Kenda states she is walking for 10 minutes 3 times per week.  Today's visit was #: 6 Starting weight: 160 lbs Starting date: 07/16/2019  Interim History: [place] voices the last few weeks have been ok. Per the patient she has lost 3 lbs since her last appointment. Her walking has been decreased secondary to foot pain. She is feeling decreased in stress as her grandson did not test positive for Trisomy 26. Her daughter will also be moving to [place] in the near future. She hasn't been emotionally eating. Her family is supportive. She wants to cut out soda.  Subjective:   1. Vitamin D deficiency Carolyne denies nausea, vomiting, or muscle weakness, but notes fatigue. She is on prescription Vit D.  2. Vitamin B 12 deficiency Jericka's B12 was 169 and she notes fatigue. She is not on B12 replacement or medications.  Assessment/Plan:   1. Vitamin D deficiency Low Vitamin D level contributes to fatigue and are associated with obesity, breast, and colon cancer. We will refill prescription Vitamin D for 1 month. Imogene will follow-up for routine testing of Vitamin D, at least 2-3 times per year  to avoid over-replacement.  2. Vitamin B 12 deficiency The diagnosis was reviewed with the patient. Counseling provided today, see below. We will continue to monitor. Pete will continue her Category 1 plan, and we will repeat labs in 1 month. Orders and follow up as documented in patient record.  Counseling . The body needs vitamin B12: to make red blood cells; to make DNA; and to help the nerves work properly so they can carry messages from the brain to the body.  . The main causes of vitamin B12 deficiency include dietary deficiency, digestive diseases, pernicious anemia, and having a surgery in which part of the stomach or small intestine is removed.  . Certain medicines can make it harder for the body to absorb vitamin B12. These medicines include: heartburn medications; some antibiotics; some medications used to treat diabetes, gout, and high cholesterol.  . In some cases, there are no symptoms of this condition. If the condition leads to anemia or nerve damage, various symptoms can occur, such as weakness or fatigue, shortness of breath, and numbness or tingling in your hands and feet.   . Treatment:  o May include taking vitamin B12 supplements.  o Avoid alcohol.  o Eat lots of healthy foods that contain vitamin B12: - Beef, pork, chicken, [place], and organ meats, such as liver.  - Seafood: This includes clams, rainbow trout, salmon, tuna, and haddock. Eggs.  - Cereal and dairy products that are fortified: This means that vitamin B12 has been added to the food.   3. Class 1 obesity with serious comorbidity and body mass  index (BMI) of 30.0 to 30.9 in adult, unspecified obesity type Yamilett is currently in the action stage of change. As such, her goal is to continue with weight loss efforts. She has agreed to the Category 1 Plan.   Exercise goals: No exercise has been prescribed at this time.  Behavioral modification strategies: increasing lean protein intake, meal planning and cooking  strategies, keeping healthy foods in the home and planning for success.  Tambria has agreed to follow-up with our clinic in 2 weeks. She was informed of the importance of frequent follow-up visits to maximize her success with intensive lifestyle modifications for her multiple health conditions.  Objective:   VITALS: Per patient if applicable, see vitals. GENERAL: Alert and in no acute distress. CARDIOPULMONARY: No increased WOB. Speaking in clear sentences.  PSYCH: Pleasant and cooperative. Speech normal rate and rhythm. Affect is appropriate. Insight and judgement are appropriate. Attention is focused, linear, and appropriate.  NEURO: Oriented as arrived to appointment on time with no prompting.   Lab Results  Component Value Date   CREATININE 0.95 07/16/2019   BUN 15 07/16/2019   NA 144 07/16/2019   K 4.7 07/16/2019   CL 102 07/16/2019   CO2 29 07/16/2019   Lab Results  Component Value Date   ALT 16 07/16/2019   AST 19 07/16/2019   ALKPHOS 67 07/16/2019   BILITOT 0.5 07/16/2019   Lab Results  Component Value Date   HGBA1C 5.5 07/16/2019   HGBA1C 5.4 07/15/2019   HGBA1C 5.4 12/12/2018   Lab Results  Component Value Date   INSULIN 22.4 07/16/2019   Lab Results  Component Value Date   TSH 2.540 07/16/2019   Lab Results  Component Value Date   CHOL 226 (H) 07/16/2019   HDL 58 07/16/2019   LDLCALC 136 (H) 07/16/2019   TRIG 182 (H) 07/16/2019   CHOLHDL 3.9 07/16/2019   Lab Results  Component Value Date   WBC 7.2 07/16/2019   HGB 14.5 07/16/2019   HCT 43.9 07/16/2019   MCV 94 07/16/2019   PLT 200 07/16/2019   No results found for: IRON, TIBC, FERRITIN  Attestation Statements:   Reviewed by clinician on day of visit: allergies, medications, problem list, medical history, surgical history, family history, social history, and previous encounter notes.  Time spent on visit including pre-visit chart review and post-visit charting and care was 14 minutes.    I,  Trixie Dredge, am acting as transcriptionist for Coralie Common, MD.  I have reviewed the above documentation for accuracy and completeness, and I agree with the above. - Jinny Blossom, MD

## 2019-09-28 ENCOUNTER — Other Ambulatory Visit (INDEPENDENT_AMBULATORY_CARE_PROVIDER_SITE_OTHER): Payer: Self-pay

## 2019-09-28 DIAGNOSIS — E559 Vitamin D deficiency, unspecified: Secondary | ICD-10-CM

## 2019-09-28 MED ORDER — VITAMIN D (ERGOCALCIFEROL) 1.25 MG (50000 UNIT) PO CAPS: 50000.0000 [IU] | ORAL_CAPSULE | ORAL | 0 refills | Status: DC

## 2019-09-29 DIAGNOSIS — Z7189 Other specified counseling: Secondary | ICD-10-CM | POA: Insufficient documentation

## 2019-09-29 NOTE — Progress Notes (Signed)
Cardiology Office Note   Date:  10/01/2019   ID:  Sarah Wong, DOB August 26, 1965, MRN [id number]  PCP:  [name], MD  Cardiologist:   No primary care provider on file.   Chief Complaint  Patient presents with   Heart Murmur      History of Present Illness: Sarah Wong is a 54 y.o. female the patient presents for evaluation having previously been seen by Dr. Rollene Fare.  He followed her for many years for her mitral valve prolapse. Echo from 2014 demonstrated no evidence of prolapse and no regurgitation. He has also followed her because of a family history of early onset heart disease.   A coronary calcium score from 2018 demonstrated no evidence of calcium.     Since I last saw her she has done well.  She had problems with her Achilles tendon and is in a walking cast right now may be needing surgery.  She still able to do some walking.  She is not having any of the chest discomfort or shortness of breath that she was having. The patient denies any new symptoms such as chest discomfort, neck or arm discomfort. There has been no new shortness of breath, PND or orthopnea. There have been no reported palpitations, presyncope or syncope.   Past Medical History:  Diagnosis Date   Edema, lower extremity    Fundic gland polyps of stomach, benign    GERD (gastroesophageal reflux disease)    History of kidney stones    HTN (hypertension)    Hyperlipidemia    history of, no medications now   Mild obstructive sleep apnea    PER STUDY 07-07-2010--  MILD OSA/  NO CPAP RX     MVP (mitral valve prolapse)    Palpitations    Renal calculus, bilateral    NON-OBSTRUTIVE   Right ureteral stone    Sleep apnea    Splenic flexure syndrome    Wears glasses     Past Surgical History:  Procedure Laterality Date   ANKLE SURGERY Left    CARDIOVASCULAR STRESS TEST  11-08-2009  [facility]   NORMAL PERFUSION STUDY/ NO ISCHEMIA/ EF 63%   CYSTOSCOPY W/ URETERAL  STENT PLACEMENT  05/21/2011   Procedure: CYSTOSCOPY WITH RETROGRADE PYELOGRAM/URETERAL STENT PLACEMENT;  Surgeon: Malka So, MD;  Location: [facility];  Service: Urology;  Laterality: Left;   CYSTOSCOPY WITH STENT PLACEMENT Right 11/20/2012   Procedure: CYSTOSCOPY WITH STENT PLACEMENT;  Surgeon: Irine Seal, MD;  Location: [facility];  Service: Urology;  Laterality: Right;   CYSTOSCOPY WITH URETEROSCOPY, STONE BASKETRY AND STENT PLACEMENT Right 11/20/2012   Procedure: RIGHT URETEROSCOPY,BALLOON DILITATION, STONE EXTRACTION  ;  Surgeon: Irine Seal, MD;  Location: [place];  Service: Urology;  Laterality: Right;   EXTRACORPOREAL SHOCK WAVE LITHOTRIPSY  LEFT 05-31-2011/   RIGHT 10-30-2012   EXTRACORPOREAL SHOCK WAVE LITHOTRIPSY Right 03/14/2017   Procedure: RIGHT EXTRACORPOREAL SHOCK WAVE LITHOTRIPSY (ESWL);  Surgeon: Bjorn Loser, MD;  Location: [facility];  Service: Urology;  Laterality: Right;   HAND SURGERY Right 11/2016   trigger finger   HOLMIUM LASER APPLICATION Right [id number]/9381   Procedure: HOLMIUM LASER APPLICATION;  Surgeon: Irine Seal, MD;  Location: [facility];  Service: Urology;  Laterality: Right;   NASAL SEPTUM SURGERY  DEC 2013   RIGHT WRIST EXPLORATION/ CAPSULOTOMY/ TENOLYSIS  03-30-2002   TENDON REPAIR Left 09/06/2016   Procedure: Tenolysis of left peroneal tendons with repair of peroneus  brevus;  Surgeon: [name], MD;  Location: [place];  Service: Orthopedics;  Laterality: Left;   TRANSTHORACIC ECHOCARDIOGRAM  03-27-2012   NORMAL LVF/  EF 55-60%/  NO EVIDENCE MVP   TUBAL LIGATION  1994   VAGINAL HYSTERECTOMY  1999   WRIST GANGLION EXCISION Right 11-14-2001     Current Outpatient Medications  Medication Sig Dispense Refill   diltiazem (DILT-XR) 180 MG 24 hr capsule DILT-XR 180 mg capsule, extended release  TAKE 1 CAPSULE (180 MG TOTAL) BY MOUTH EVERY MORNING.     estradiol (ESTRACE) 1 MG  tablet Take 1 mg by mouth daily.     furosemide (LASIX) 20 MG tablet Take 1 tablet (20 mg total) by mouth daily as needed. 90 tablet 3   metoprolol succinate (TOPROL-XL) 25 MG 24 hr tablet Take 1 tablet (25 mg total) by mouth daily. 90 tablet 3   oxybutynin (DITROPAN) 5 MG tablet Take 5 mg by mouth 3 (three) times daily.     pantoprazole (PROTONIX) 40 MG tablet Take 1 tablet (40 mg total) by mouth 2 (two) times daily. 180 tablet 3   potassium citrate (UROCIT-K) 10 MEQ (1080 MG) SR tablet potassium citrate ER 10 mEq (1,080 mg) tablet,extended release     Vitamin D, Ergocalciferol, (DRISDOL) 1.25 MG (50000 UNIT) CAPS capsule Take 1 capsule (50,000 Units total) by mouth every 7 (seven) days. 4 capsule 0   XIIDRA 5 % SOLN      No current facility-administered medications for this visit.    Allergies:   Penicillins, Vancomycin, Iohexol, and Ivp dye [iodinated diagnostic agents]    ROS:  Please see the history of present illness.   Otherwise, review of systems are positive for none.   All other systems are reviewed and negative.    PHYSICAL EXAM: VS:  BP (!) 142/74    Pulse (!) 46    Ht 5' (1.524 m)    Wt 165 lb (74.8 kg)    SpO2 99%    BMI 32.22 kg/m  , BMI Body mass index is 32.22 kg/m. GENERAL:  Well appearing NECK:  No jugular venous distention, waveform within normal limits, carotid upstroke brisk and symmetric, no bruits, no thyromegaly LUNGS:  Clear to auscultation bilaterally CHEST:  Unremarkable HEART:  PMI not displaced or sustained,S1 and S2 within normal limits, no S3, no S4, no clicks, no rubs, very soft murmur only at the axilla and lying in left lateral position, no diastolic murmurs ABD:  Flat, positive bowel sounds normal in frequency in pitch, no bruits, no rebound, no guarding, no midline pulsatile mass, no hepatomegaly, no splenomegaly EXT:  2 plus pulses throughout, no edema, no cyanosis no clubbing   EKG:  EKG is not ordered today. The ekg ordered 07/16/2019  demonstrates sinus bradycardia, rate 45, low voltage, borderline interventricular conduction delay, no acute ST-T wave changes, no change from previous.   Recent Labs: 07/16/2019: ALT 16; BUN 15; Creatinine, Ser 0.95; Hemoglobin 14.5; Platelets 200; Potassium 4.7; Sodium 144; TSH 2.540    Lipid Panel    Component Value Date/Time   CHOL 226 (H) 07/16/2019 1537   TRIG 182 (H) 07/16/2019 1537   HDL 58 07/16/2019 1537   CHOLHDL 3.9 07/16/2019 1537   CHOLHDL 4 09/29/2018 1125   VLDL 31.6 09/29/2018 1125   LDLCALC 136 (H) 07/16/2019 1537      Wt Readings from Last 3 Encounters:  10/01/19 165 lb (74.8 kg)  09/23/19 154 lb (69.9 kg)  09/09/19 157  lb (71.2 kg)      Other studies Reviewed: Additional studies/ records that were reviewed today include: Labs. Review of the above records demonstrates:  Please see elsewhere in the note.     ASSESSMENT AND PLAN:  DYSPNEA AND CHEST PAIN:    She is no longer having this.  No further work-up.  DYSLIPIDEMIA:     LDL was 136 with an HDL of 58.  Given the absence of coronary calcium no change in therapy is indicated. he has no indication for statin or ASA  MVP: I did not see evidence of this echo would not suggest this by exam.  However, there is a slight systolic murmur which I did not appreciate.  It has been 7 years since her echo and I will repeat this.  BRADYCARDIA:  She has a very slow heart rate and she is on medications that impacted this but she is very tolerant of this and has no symptoms.  No change in therapy.  We discussed this today and should she have any symptomatic bradycardia in the future we would have to back off.  COVID EDUCATION: She has had her vaccine.  Current medicines are reviewed at length with the patient today.  The patient does not have concerns regarding medicines.  The following changes have been made:  no change  Labs/ tests ordered today include:   Orders Placed This Encounter  Procedures    ECHOCARDIOGRAM COMPLETE     Disposition:   FU with me in two years.     Signed, Minus Breeding, MD  10/01/2019 10:06 AM    [facility]

## 2019-10-01 ENCOUNTER — Encounter: Payer: Self-pay | Admitting: Cardiology

## 2019-10-01 ENCOUNTER — Ambulatory Visit (INDEPENDENT_AMBULATORY_CARE_PROVIDER_SITE_OTHER): Payer: 59 | Admitting: Cardiology

## 2019-10-01 ENCOUNTER — Other Ambulatory Visit: Payer: Self-pay

## 2019-10-01 VITALS — BP 142/74 | HR 46 | Ht 60.0 in | Wt 165.0 lb

## 2019-10-01 DIAGNOSIS — R001 Bradycardia, unspecified: Secondary | ICD-10-CM

## 2019-10-01 DIAGNOSIS — Z7189 Other specified counseling: Secondary | ICD-10-CM

## 2019-10-01 DIAGNOSIS — R0609 Other forms of dyspnea: Secondary | ICD-10-CM

## 2019-10-01 DIAGNOSIS — R0600 Dyspnea, unspecified: Secondary | ICD-10-CM | POA: Diagnosis not present

## 2019-10-01 DIAGNOSIS — E785 Hyperlipidemia, unspecified: Secondary | ICD-10-CM

## 2019-10-01 DIAGNOSIS — R011 Cardiac murmur, unspecified: Secondary | ICD-10-CM

## 2019-10-01 NOTE — Patient Instructions (Signed)
Medication Instructions:  Your physician recommends that you continue on your current medications as directed. Please refer to the Current Medication list given to you today.  *If you need a refill on your cardiac medications before your next appointment, please call your pharmacy*  Testing/Procedures: Your physician has requested that you have an echocardiogram. Echocardiography is a painless test that uses sound waves to create images of your heart. It provides your doctor with information about the size and shape of your heart and how well your heart's chambers and valves are working. This procedure takes approximately one hour. There are no restrictions for this procedure.  This will be done at our [facility] location:  [place]: At [employer] [facility], you and your health needs are our priority.  As part of our continuing mission to provide you with exceptional heart care, we have created designated Provider Care Teams.  These Care Teams include your primary Cardiologist (physician) and Advanced Practice Providers (APPs -  Physician Assistants and Nurse Practitioners) who all work together to provide you with the care you need, when you need it.  We recommend signing up for the patient portal called "MyChart".  Sign up information is provided on this After Visit Summary.  MyChart is used to connect with patients for Virtual Visits (Telemedicine).  Patients are able to view lab/test results, encounter notes, upcoming appointments, etc.  Non-urgent messages can be sent to your provider as well.   To learn more about what you can do with MyChart, go to [email or web address].    Your next appointment:   2 year(s)  The format for your next appointment:   In Person  Provider:   Minus Breeding, MD

## 2019-10-02 ENCOUNTER — Encounter: Payer: 59 | Admitting: Family Medicine

## 2019-10-05 ENCOUNTER — Other Ambulatory Visit: Payer: Self-pay | Admitting: Cardiology

## 2019-10-08 ENCOUNTER — Ambulatory Visit (INDEPENDENT_AMBULATORY_CARE_PROVIDER_SITE_OTHER): Payer: 59 | Admitting: Family Medicine

## 2019-10-08 ENCOUNTER — Encounter (INDEPENDENT_AMBULATORY_CARE_PROVIDER_SITE_OTHER): Payer: Self-pay | Admitting: Family Medicine

## 2019-10-08 ENCOUNTER — Other Ambulatory Visit: Payer: Self-pay

## 2019-10-08 VITALS — BP 124/73 | HR 66 | Temp 98.0°F | Ht 60.0 in | Wt 156.0 lb

## 2019-10-08 DIAGNOSIS — E669 Obesity, unspecified: Secondary | ICD-10-CM

## 2019-10-08 DIAGNOSIS — I10 Essential (primary) hypertension: Secondary | ICD-10-CM | POA: Diagnosis not present

## 2019-10-08 DIAGNOSIS — E8881 Metabolic syndrome: Secondary | ICD-10-CM | POA: Diagnosis not present

## 2019-10-08 DIAGNOSIS — Z6830 Body mass index (BMI) 30.0-30.9, adult: Secondary | ICD-10-CM

## 2019-10-08 NOTE — Progress Notes (Signed)
Chief Complaint:   OBESITY Jamaiyah is here to discuss her progress with her obesity treatment plan along with follow-up of her obesity related diagnoses. Nikol is on the Category 1 Plan and states she is following her eating plan approximately 70% of the time. Chantel states she is doing 0 minutes 0 times per week.  Today's visit was #: 7 Starting weight: 160 lbs Starting date: 07/16/2019 Today's weight: 156 lbs Today's date: 10/08/2019 Total lbs lost to date: 4 Total lbs lost since last in-office visit: 1  Interim History: Zamarah is still in walking boot and now she cant walk with out the boot. She followed the plan 70% and the other 30% was indulgences in pasta, and she had more than 1 snack (did skinny girl popcorn). She has questions about incorporating tuna fish into the meal plan.  Subjective:   1. Essential hypertension Jaicee's blood pressure is controlled today. She sees Dr. Percival Spanish, and she has an upcoming echocardiogram.  2. Insulin resistance Lakendra's last insulin level was 22.4 and A1c 5.5. she notes carbohydrate cravings.  Assessment/Plan:   1. Essential hypertension Emmry is working on healthy weight loss and exercise to improve blood pressure control. We will watch for signs of hypotension as she continues her lifestyle modifications. She is to follow up with her previously scheduled echocardiogram.  2. Insulin resistance Deleah will continue to work on weight loss, exercise, and decreasing simple carbohydrates to help decrease the risk of diabetes. We will repeat labs in 1 month. Lima agreed to follow-up with [place] as directed to closely monitor her progress.  3. Class 1 obesity with serious comorbidity and body mass index (BMI) of 30.0 to 30.9 in adult, unspecified obesity type Todd is currently in the action stage of change. As such, her goal is to continue with weight loss efforts. She has agreed to the Category 1 Plan.   Exercise goals: No exercise has  been prescribed at this time.  Behavioral modification strategies: increasing lean protein intake, increasing vegetables, meal planning and cooking strategies and keeping healthy foods in the home.  Stachia has agreed to follow-up with our clinic in 2 weeks. She was informed of the importance of frequent follow-up visits to maximize her success with intensive lifestyle modifications for her multiple health conditions.   Objective:   Blood pressure 124/73, pulse 66, temperature 98 F (36.7 C), temperature source Oral, height 5' (1.524 m), weight 156 lb (70.8 kg), SpO2 98 %. Body mass index is 30.47 kg/m.  General: Cooperative, alert, well developed, in no acute distress. HEENT: Conjunctivae and lids unremarkable. Cardiovascular: Regular rhythm.  Lungs: Normal work of breathing. Neurologic: No focal deficits.   Lab Results  Component Value Date   CREATININE 0.95 07/16/2019   BUN 15 07/16/2019   NA 144 07/16/2019   K 4.7 07/16/2019   CL 102 07/16/2019   CO2 29 07/16/2019   Lab Results  Component Value Date   ALT 16 07/16/2019   AST 19 07/16/2019   ALKPHOS 67 07/16/2019   BILITOT 0.5 07/16/2019   Lab Results  Component Value Date   HGBA1C 5.5 07/16/2019   HGBA1C 5.4 07/15/2019   HGBA1C 5.4 12/12/2018   Lab Results  Component Value Date   INSULIN 22.4 07/16/2019   Lab Results  Component Value Date   TSH 2.540 07/16/2019   Lab Results  Component Value Date   CHOL 226 (H) 07/16/2019   HDL 58 07/16/2019   LDLCALC 136 (H) 07/16/2019  TRIG 182 (H) 07/16/2019   CHOLHDL 3.9 07/16/2019   Lab Results  Component Value Date   WBC 7.2 07/16/2019   HGB 14.5 07/16/2019   HCT 43.9 07/16/2019   MCV 94 07/16/2019   PLT 200 07/16/2019   No results found for: IRON, TIBC, FERRITIN  Attestation Statements:   Reviewed by clinician on day of visit: allergies, medications, problem list, medical history, surgical history, family history, social history, and previous encounter  notes.  Time spent on visit including pre-visit chart review and post-visit care and charting was 15 minutes.    I, Trixie Dredge, am acting as transcriptionist for Coralie Common, MD.  I have reviewed the above documentation for accuracy and completeness, and I agree with the above. - Jinny Blossom, MD

## 2019-10-13 ENCOUNTER — Other Ambulatory Visit: Payer: Self-pay

## 2019-10-14 ENCOUNTER — Ambulatory Visit (INDEPENDENT_AMBULATORY_CARE_PROVIDER_SITE_OTHER): Payer: 59 | Admitting: Family Medicine

## 2019-10-14 ENCOUNTER — Encounter: Payer: Self-pay | Admitting: Family Medicine

## 2019-10-14 VITALS — BP 130/80 | HR 40 | Temp 97.7°F | Ht 60.0 in | Wt 161.0 lb

## 2019-10-14 DIAGNOSIS — E538 Deficiency of other specified B group vitamins: Secondary | ICD-10-CM | POA: Diagnosis not present

## 2019-10-14 DIAGNOSIS — E559 Vitamin D deficiency, unspecified: Secondary | ICD-10-CM | POA: Diagnosis not present

## 2019-10-14 DIAGNOSIS — E785 Hyperlipidemia, unspecified: Secondary | ICD-10-CM

## 2019-10-14 DIAGNOSIS — Z23 Encounter for immunization: Secondary | ICD-10-CM

## 2019-10-14 DIAGNOSIS — R7303 Prediabetes: Secondary | ICD-10-CM

## 2019-10-14 DIAGNOSIS — Z0000 Encounter for general adult medical examination without abnormal findings: Secondary | ICD-10-CM | POA: Diagnosis not present

## 2019-10-14 NOTE — Patient Instructions (Signed)

## 2019-10-14 NOTE — Progress Notes (Addendum)
Established Patient Office Visit  Subjective:  Patient ID: Sarah Wong, female    DOB: 1965/01/04  Age: 54 y.o. MRN: [id number]  CC:   HPI Sarah Wong presents for physical exam.  She is followed by GYN and they have her currently on Estrace.  She has history of bradycardia and is maintained on diltiazem and metoprolol per cardiology.  Past history of frequent palpitations.  Recent EKG unremarkable with exception of bradycardia.  Echocardiogram pending.  Reported history of mitral valve prolapse.  She has history of GERD and has been on Protonix chronically.  She has not had success tapering back on this in the past.  She is followed by GI.  Health Maintenance reviewed  -Needs flu vaccine today -Covid vaccines already given -Previous hepatitis C screen negative -Colonoscopy due 10/26/1928 -Tetanus due 2029 -Mammogram scheduled for this November -No indication for Paps as she has had previous hysterectomy -Has had previous shingles vaccine  She has history of prediabetes range blood sugars.  She is followed by weight loss clinic.  She is requesting repeat A1c today.  Recent A1c has been in the 5 range.  The 10-year ASCVD risk score Mikey Bussing DC Brooke Bonito., et al., 2013) is: 3.2%   Values used to calculate the score:     Age: 51 years     Sex: Female     Is Non-Hispanic African American: No     Diabetic: No     Tobacco smoker: No     Systolic Blood Pressure: 458 mmHg     Is BP treated: Yes     HDL Cholesterol: 58 mg/dL     Total Cholesterol: 218 mg/dL   Past Medical History:  Diagnosis Date  . Edema, lower extremity   . Fundic gland polyps of stomach, benign   . GERD (gastroesophageal reflux disease)   . History of kidney stones   . HTN (hypertension)   . Hyperlipidemia    history of, no medications now  . Mild obstructive sleep apnea    PER STUDY 07-07-2010--  MILD OSA/  NO CPAP RX    . MVP (mitral valve prolapse)   . Palpitations   . Renal calculus, bilateral     NON-OBSTRUTIVE  . Right ureteral stone   . Sleep apnea   . Splenic flexure syndrome   . Wears glasses     Past Surgical History:  Procedure Laterality Date  . ANKLE SURGERY Left   . CARDIOVASCULAR STRESS TEST  11-08-2009  DR Rollene Fare   NORMAL PERFUSION STUDY/ NO ISCHEMIA/ EF 63%  . CYSTOSCOPY W/ URETERAL STENT PLACEMENT  05/21/2011   Procedure: CYSTOSCOPY WITH RETROGRADE PYELOGRAM/URETERAL STENT PLACEMENT;  Surgeon: Malka So, MD;  Location: [facility];  Service: Urology;  Laterality: Left;  . CYSTOSCOPY WITH STENT PLACEMENT Right 11/20/2012   Procedure: CYSTOSCOPY WITH STENT PLACEMENT;  Surgeon: Irine Seal, MD;  Location: [facility];  Service: Urology;  Laterality: Right;  . CYSTOSCOPY WITH URETEROSCOPY, STONE BASKETRY AND STENT PLACEMENT Right 11/20/2012   Procedure: RIGHT URETEROSCOPY,BALLOON DILITATION, STONE EXTRACTION  ;  Surgeon: Irine Seal, MD;  Location: [place];  Service: Urology;  Laterality: Right;  . EXTRACORPOREAL SHOCK WAVE LITHOTRIPSY  LEFT 05-31-2011/   RIGHT 10-30-2012  . EXTRACORPOREAL SHOCK WAVE LITHOTRIPSY Right 03/14/2017   Procedure: RIGHT EXTRACORPOREAL SHOCK WAVE LITHOTRIPSY (ESWL);  Surgeon: Bjorn Loser, MD;  Location: [facility];  Service: Urology;  Laterality: Right;  . HAND SURGERY Right 11/2016   trigger finger  .  HOLMIUM LASER APPLICATION Right [id number]/6834   Procedure: HOLMIUM LASER APPLICATION;  Surgeon: Irine Seal, MD;  Location: [facility];  Service: Urology;  Laterality: Right;  . NASAL SEPTUM SURGERY  DEC 2013  . RIGHT WRIST EXPLORATION/ CAPSULOTOMY/ TENOLYSIS  03-30-2002  . TENDON REPAIR Left 09/06/2016   Procedure: Tenolysis of left peroneal tendons with repair of peroneus brevus;  Surgeon: [name], MD;  Location: [place];  Service: Orthopedics;  Laterality: Left;  . TRANSTHORACIC ECHOCARDIOGRAM  03-27-2012   NORMAL LVF/  EF 55-60%/  NO EVIDENCE MVP  . TUBAL LIGATION  1994  .  VAGINAL HYSTERECTOMY  1999  . WRIST GANGLION EXCISION Right 11-14-2001    Family History  Problem Relation Age of Onset  . Hypertension Mother   . Diabetes Mother   . COPD Mother   . Heart disease Mother 60       "aortic replacement" CABG.  Died age 59  . Hyperlipidemia Mother   . Sudden death Mother   . Thyroid disease Mother   . Sleep apnea Mother   . Obesity Mother   . Hypertension Father   . Heart failure Father        open heart surgery  . Hyperlipidemia Father   . Heart disease Father   . Cancer Father   . Stroke Maternal Grandmother   . Colon polyps Neg Hx   . Rectal cancer Neg Hx   . Stomach cancer Neg Hx   . Esophageal cancer Neg Hx     Social History   Socioeconomic History  . Marital status: Married    Spouse name: Not on file  . Number of children: 2  . Years of education: Not on file  . Highest education level: Not on file  Occupational History  . Occupation: Architect  Tobacco Use  . Smoking status: Never Smoker  . Smokeless tobacco: Never Used  Vaping Use  . Vaping Use: Never used  Substance and Sexual Activity  . Alcohol use: No  . Drug use: No  . Sexual activity: Not on file  Other Topics Concern  . Not on file  Social History Narrative   Lives with husband and two children.     Social Determinants of Health   Financial Resource Strain:   . Difficulty of Paying Living Expenses: Not on file  Food Insecurity:   . Worried About Charity fundraiser in the Last Year: Not on file  . Ran Out of Food in the Last Year: Not on file  Transportation Needs:   . Lack of Transportation (Medical): Not on file  . Lack of Transportation (Non-Medical): Not on file  Physical Activity:   . Days of Exercise per Week: Not on file  . Minutes of Exercise per Session: Not on file  Stress:   . Feeling of Stress : Not on file  Social Connections:   . Frequency of Communication with Friends and Family: Not on file  . Frequency of Social Gatherings  with Friends and Family: Not on file  . Attends Religious Services: Not on file  . Active Member of Clubs or Organizations: Not on file  . Attends Archivist Meetings: Not on file  . Marital Status: Not on file  Intimate Partner Violence:   . Fear of Current or Ex-Partner: Not on file  . Emotionally Abused: Not on file  . Physically Abused: Not on file  . Sexually Abused: Not on file    Outpatient  Medications Prior to Visit  Medication Sig Dispense Refill  . B Complex Vitamins (VITAMIN B COMPLEX PO) Take 500 mg by mouth.    Marland Kitchen DILT-XR 180 MG 24 hr capsule TAKE 1 CAPSULE (180 MG TOTAL) BY MOUTH EVERY MORNING. 90 capsule 3  . estradiol (ESTRACE) 1 MG tablet Take 1 mg by mouth daily.    . furosemide (LASIX) 20 MG tablet TAKE 1 TABLET BY MOUTH EVERY DAY AS NEEDED 90 tablet 3  . metoprolol succinate (TOPROL-XL) 25 MG 24 hr tablet TAKE 1 TABLET BY MOUTH EVERY DAY 90 tablet 3  . oxybutynin (DITROPAN) 5 MG tablet Take 5 mg by mouth daily.     . pantoprazole (PROTONIX) 40 MG tablet TAKE 1 TABLET BY MOUTH TWICE A DAY 180 tablet 3  . potassium citrate (UROCIT-K) 10 MEQ (1080 MG) SR tablet potassium citrate ER 10 mEq (1,080 mg) tablet,extended release    . Vitamin D, Ergocalciferol, (DRISDOL) 1.25 MG (50000 UNIT) CAPS capsule Take 1 capsule (50,000 Units total) by mouth every 7 (seven) days. 4 capsule 0  . XIIDRA 5 % SOLN      No facility-administered medications prior to visit.    Allergies  Allergen Reactions  . Penicillins Hives and Rash  . Vancomycin Swelling    Other reaction(s): Redness  . Iohexol Hives     Code: HIVES, Desc: HIVES WITH IV CONTRAST MEDIA- PT REQUIRES 13 HR PRE-MEDS, Onset Date: [id number]   . Ivp Dye [Iodinated Diagnostic Agents] Hives    ROS Review of Systems  Constitutional: Negative for activity change, appetite change, fatigue, fever and unexpected weight change.  HENT: Negative for ear pain, hearing loss, sore throat and trouble swallowing.   Eyes:  Negative for visual disturbance.  Respiratory: Negative for cough and shortness of breath.   Cardiovascular: Negative for chest pain and palpitations.  Gastrointestinal: Negative for abdominal pain, blood in stool, constipation and diarrhea.  Genitourinary: Negative for dysuria and hematuria.  Musculoskeletal: Negative for arthralgias, back pain and myalgias.  Skin: Negative for rash.  Neurological: Negative for dizziness, syncope and headaches.  Hematological: Negative for adenopathy.  Psychiatric/Behavioral: Negative for confusion and dysphoric mood.      Objective:    Physical Exam Constitutional:      Appearance: She is well-developed.  HENT:     Head: Normocephalic and atraumatic.  Eyes:     Pupils: Pupils are equal, round, and reactive to light.  Neck:     Thyroid: No thyromegaly.  Cardiovascular:     Rate and Rhythm: Normal rate and regular rhythm.     Heart sounds: Normal heart sounds. No murmur heard.   Pulmonary:     Effort: No respiratory distress.     Breath sounds: Normal breath sounds. No wheezing or rales.  Abdominal:     General: Bowel sounds are normal. There is no distension.     Palpations: Abdomen is soft. There is no mass.     Tenderness: There is no abdominal tenderness. There is no guarding or rebound.  Genitourinary:    Comments: Per gyn Musculoskeletal:        General: Normal range of motion.     Cervical back: Normal range of motion and neck supple.  Lymphadenopathy:     Cervical: No cervical adenopathy.  Skin:    Findings: No rash.  Neurological:     Mental Status: She is alert and oriented to person, place, and time.     Cranial Nerves: No cranial nerve deficit.  Deep Tendon Reflexes: Reflexes normal.  Psychiatric:        Behavior: Behavior normal.        Thought Content: Thought content normal.        Judgment: Judgment normal.     BP 130/80 (BP Location: Left Arm, Patient Position: Sitting, Cuff Size: Normal)   Pulse (!) 40    Temp 97.7 F (36.5 C) (Oral)   Ht 5' (1.524 m)   Wt 161 lb (73 kg)   SpO2 98%   BMI 31.44 kg/m  Wt Readings from Last 3 Encounters:  10/14/19 161 lb (73 kg)  10/08/19 156 lb (70.8 kg)  10/01/19 165 lb (74.8 kg)     There are no preventive care reminders to display for this patient.  There are no preventive care reminders to display for this patient.  Lab Results  Component Value Date   TSH 2.540 07/16/2019   Lab Results  Component Value Date   WBC 7.2 07/16/2019   HGB 14.5 07/16/2019   HCT 43.9 07/16/2019   MCV 94 07/16/2019   PLT 200 07/16/2019   Lab Results  Component Value Date   NA 144 07/16/2019   K 4.7 07/16/2019   CO2 29 07/16/2019   GLUCOSE 103 (H) 07/16/2019   BUN 15 07/16/2019   CREATININE 0.95 07/16/2019   BILITOT 0.5 07/16/2019   ALKPHOS 67 07/16/2019   AST 19 07/16/2019   ALT 16 07/16/2019   PROT 7.5 07/16/2019   ALBUMIN 4.7 07/16/2019   CALCIUM 9.7 07/16/2019   ANIONGAP 6 08/30/2016   GFR 59.54 (L) 09/29/2018   Lab Results  Component Value Date   CHOL 226 (H) 07/16/2019   Lab Results  Component Value Date   HDL 58 07/16/2019   Lab Results  Component Value Date   LDLCALC 136 (H) 07/16/2019   Lab Results  Component Value Date   TRIG 182 (H) 07/16/2019   Lab Results  Component Value Date   CHOLHDL 3.9 07/16/2019   Lab Results  Component Value Date   HGBA1C 5.5 07/16/2019      Assessment & Plan:   Physical exam.  Patient has chronic issues as above.  We discussed the following health maintenance issues  -Flu vaccine given -She will consider Covid vaccine booster soon. -Continue GYN follow-up -Obtain labs including vitamin D, B12, lipid -Continue regular weightbearing exercise -She will consider discussing possible DEXA scan with her GYN given her chronic PPI use  No orders of the defined types were placed in this encounter.   Follow-up: No follow-ups on file.    Carolann Littler, MD

## 2019-10-15 LAB — LIPID PANEL
Cholesterol: 218 mg/dL — ABNORMAL HIGH (ref <200)
HDL: 58 mg/dL (ref >=50)
LDL Cholesterol (Calc): 133 mg/dL (calc) — ABNORMAL HIGH
Non-HDL Cholesterol (Calc): 160 mg/dL (calc) — ABNORMAL HIGH (ref <130)
Total CHOL/HDL Ratio: 3.8 (calc) (ref <5.0)
Triglycerides: 141 mg/dL (ref <150)

## 2019-10-15 LAB — VITAMIN D 25 HYDROXY (VIT D DEFICIENCY, FRACTURES): Vit D, 25-Hydroxy: 61 ng/mL (ref 30–100)

## 2019-10-15 LAB — VITAMIN B12: Vitamin B-12: 542 pg/mL (ref 200–1100)

## 2019-10-21 ENCOUNTER — Ambulatory Visit (HOSPITAL_COMMUNITY): Payer: 59 | Attending: Cardiology

## 2019-10-21 ENCOUNTER — Other Ambulatory Visit: Payer: Self-pay

## 2019-10-21 DIAGNOSIS — R011 Cardiac murmur, unspecified: Secondary | ICD-10-CM | POA: Insufficient documentation

## 2019-10-21 DIAGNOSIS — R0609 Other forms of dyspnea: Secondary | ICD-10-CM

## 2019-10-21 DIAGNOSIS — R0600 Dyspnea, unspecified: Secondary | ICD-10-CM | POA: Diagnosis not present

## 2019-10-21 LAB — ECHOCARDIOGRAM COMPLETE
Area-P 1/2: 1.76 cm2
S' Lateral: 3.3 cm

## 2019-10-28 ENCOUNTER — Telehealth (INDEPENDENT_AMBULATORY_CARE_PROVIDER_SITE_OTHER): Payer: Self-pay

## 2019-10-28 ENCOUNTER — Other Ambulatory Visit: Payer: Self-pay

## 2019-10-28 ENCOUNTER — Encounter (INDEPENDENT_AMBULATORY_CARE_PROVIDER_SITE_OTHER): Payer: Self-pay | Admitting: Family Medicine

## 2019-10-28 ENCOUNTER — Telehealth (INDEPENDENT_AMBULATORY_CARE_PROVIDER_SITE_OTHER): Payer: 59 | Admitting: Family Medicine

## 2019-10-28 VITALS — Ht 60.0 in | Wt 156.0 lb

## 2019-10-28 DIAGNOSIS — E559 Vitamin D deficiency, unspecified: Secondary | ICD-10-CM | POA: Diagnosis not present

## 2019-10-28 DIAGNOSIS — E669 Obesity, unspecified: Secondary | ICD-10-CM | POA: Diagnosis not present

## 2019-10-28 DIAGNOSIS — Z6830 Body mass index (BMI) 30.0-30.9, adult: Secondary | ICD-10-CM

## 2019-10-28 DIAGNOSIS — Z9189 Other specified personal risk factors, not elsewhere classified: Secondary | ICD-10-CM

## 2019-10-28 MED ORDER — VITAMIN D (ERGOCALCIFEROL) 1.25 MG (50000 UNIT) PO CAPS: 50000.0000 [IU] | ORAL_CAPSULE | ORAL | 0 refills | Status: DC

## 2019-10-28 NOTE — Telephone Encounter (Signed)
Contacted patient, verbal consent obtained to conduct visit via telehealth.

## 2019-10-29 NOTE — Progress Notes (Signed)
TeleHealth Visit:  Due to the COVID-19 pandemic, this visit was completed with telemedicine (audio/video) technology to reduce patient and provider exposure as well as to preserve personal protective equipment.   Sarah Wong has verbally consented to this TeleHealth visit. The patient is located at home, the provider is located at the [facility] office. The participants in this visit include the listed provider and patient. The visit was conducted today via MyChart video.  Chief Complaint: OBESITY Sarah Wong is here to discuss her progress with her obesity treatment plan along with follow-up of her obesity related diagnoses. Sarah Wong is on the Category 1 Plan and states she is following her eating plan approximately 80% of the time. Sarah Wong states she is exercising for 0 minutes 0 times per week.  Today's visit was #: 8 Starting weight: 160 lbs;  Per pt weight 156 lbs at home Starting date: 07/16/2019  Interim History: Sarah Wong says she woke up with nausea, vomiting, and diarrhea and sore throat, which started this morning.   Denies fever, shortness of breath/difficulty breathing.    She has not called her PCP yet.    She says she had the [employer] vaccine months ago in March.  She has not had a booster yet.    Had flu shot 1-2 weeks ago.    - Prior to today, she is eating breakfast and lunch on the plan, but is not following the plan for dinner.   For dinner, she is skipping a lot of times and does not want to eat too late.   - Not meal prepping and not taking time away from work to eat.  - She is happy she is decreasing caloric beverages and drinking more water.   Assessment/Plan:   1. Vitamin D deficiency Sarah MERLIN has a history of Vitamin D deficiency with resultant generalized fatigue as her primary symptom.  she is taking vitamin D 50,000 IU weekly for this deficiency and tolerating it well without side-effect.  Most recent Vitamin D lab reviewed-  level: 61.0 on  10/14/2019.  Plan:  - Discussed importance of vitamin D (as well as calcium) to their health and wellbeing.  - We reviewed possible symptoms of low Vitamin D including low energy, depressed mood, muscle aches, joint aches, osteoporosis, etc. - We discussed that low Vitamin D levels may be linked to an increased risk of cardiovascular events, and even increased risk of cancers, such as colon and breast.  - Educated pt that weight loss will likely improve availability of vitamin D, thus encouraged Sarah Wong to continue with meal plan and their weight loss efforts to further improve this condition. - I recommend pt take weekly prescription vit D 50,000 IU - see script below - Informed patient this may be a lifelong thing, and she was encouraged to continue to take the medicine until told otherwise.  We will need to monitor levels regularly (every 3-4 mo on average) to keep levels within normal limits.  -Refill Vitamin D, Ergocalciferol, (DRISDOL) 1.25 MG (50000 UNIT) CAPS capsule; Take 1 capsule (50,000 Units total) by mouth every 7 (seven) days.  Dispense: 4 capsule; Refill: 0   2. At increased risk of exposure to COVID-19 virus Sarah Wong was given approximately 9 minutes of COVID prevention counseling today Counseling  COVID-19 is a respiratory infection that is caused by a virus. It can cause serious infections, such as pneumonia, acute respiratory distress syndrome, acute respiratory failure, or sepsis.  You are more likely to develop  a serious illness if you are 8 years of age or older, have a weak immune system, live in a nursing home, have chronic disease, or have obesity.  Get vaccinated as soon as they are available to you.  For our most current information, please visit [email or web address].  I recommend she get a booster dose with moderna or [employer] your hands often with soap and water for 20 seconds. If soap and water are not available, use alcohol-based hand  sanitizer.  Wear a face mask. Make sure your mask covers your nose and mouth.  Maintain at least 6 feet distance from others when in public.  Get help right away if  You have trouble breathing, chest pain, confusion, or other concerning symptoms.  Repetitive spaced learning was employed today to elicit superior memory formation and behavioral change.  3. Class 1 obesity with serious comorbidity and body mass index (BMI) of 30.0 to 30.9 in adult, unspecified obesity type  Sarah Wong is currently in the action stage of change. As such, her goal is to continue with weight loss efforts. She has agreed to the Category 1 Plan.   Exercise goals: No exercise has been prescribed at this time due to pt being acutely ill  Behavioral modification strategies: increasing water intake, decreasing liquid calories and no skipping meals.  Sarah Wong has agreed to follow-up with our clinic in 2-3 weeks. She was informed of the importance of frequent follow-up visits to maximize her success with intensive lifestyle modifications for her multiple health conditions.  Objective:  Height 5' (1.524 m), weight 156 lb (70.8 kg).  VITALS: Per patient if applicable, see vitals. GENERAL: Alert and in no acute distress. CARDIOPULMONARY: No increased WOB. Speaking in clear sentences.  PSYCH: Pleasant and cooperative. Speech normal rate and rhythm. Affect is appropriate. Insight and judgement are appropriate. Attention is focused, linear, and appropriate.  NEURO: Oriented as arrived to appointment on time with no prompting.   Lab Results  Component Value Date   CREATININE 0.95 07/16/2019   BUN 15 07/16/2019   NA 144 07/16/2019   K 4.7 07/16/2019   CL 102 07/16/2019   CO2 29 07/16/2019   Lab Results  Component Value Date   ALT 16 07/16/2019   AST 19 07/16/2019   ALKPHOS 67 07/16/2019   BILITOT 0.5 07/16/2019   Lab Results  Component Value Date   HGBA1C 5.5 07/16/2019   HGBA1C 5.4 07/15/2019   HGBA1C 5.4  12/12/2018   Lab Results  Component Value Date   INSULIN 22.4 07/16/2019   Lab Results  Component Value Date   TSH 2.540 07/16/2019   Lab Results  Component Value Date   CHOL 218 (H) 10/14/2019   HDL 58 10/14/2019   LDLCALC 133 (H) 10/14/2019   TRIG 141 10/14/2019   CHOLHDL 3.8 10/14/2019   Lab Results  Component Value Date   WBC 7.2 07/16/2019   HGB 14.5 07/16/2019   HCT 43.9 07/16/2019   MCV 94 07/16/2019   PLT 200 07/16/2019   Attestation Statements:   Reviewed by clinician on day of visit: allergies, medications, problem list, medical history, surgical history, family history, social history, and previous encounter notes.  I, Water quality scientist, CMA, am acting as Location manager for [employer], DO.  I have reviewed the above documentation for accuracy and completeness, and I agree with the above. Sarah Wong, D.O.  The [place] was signed into law in 2016 which includes the topic of electronic health  records.  This provides immediate access to information in MyChart.  This includes consultation notes, operative notes, office notes, lab results and pathology reports.  If you have any questions about what you read please let us know at your next visit so we can discuss your concerns and take corrective action if need be.  We are right here with you.

## 2019-11-12 ENCOUNTER — Ambulatory Visit (INDEPENDENT_AMBULATORY_CARE_PROVIDER_SITE_OTHER): Payer: 59 | Admitting: Family Medicine

## 2019-11-16 ENCOUNTER — Other Ambulatory Visit: Payer: Self-pay

## 2019-11-16 ENCOUNTER — Ambulatory Visit (INDEPENDENT_AMBULATORY_CARE_PROVIDER_SITE_OTHER): Payer: 59 | Admitting: Family Medicine

## 2019-11-16 ENCOUNTER — Encounter (INDEPENDENT_AMBULATORY_CARE_PROVIDER_SITE_OTHER): Payer: Self-pay | Admitting: Family Medicine

## 2019-11-16 VITALS — BP 131/76 | HR 59 | Temp 97.7°F | Ht 60.0 in | Wt 157.0 lb

## 2019-11-16 DIAGNOSIS — Z9189 Other specified personal risk factors, not elsewhere classified: Secondary | ICD-10-CM

## 2019-11-16 DIAGNOSIS — E559 Vitamin D deficiency, unspecified: Secondary | ICD-10-CM

## 2019-11-16 DIAGNOSIS — F3289 Other specified depressive episodes: Secondary | ICD-10-CM

## 2019-11-16 DIAGNOSIS — Z6830 Body mass index (BMI) 30.0-30.9, adult: Secondary | ICD-10-CM | POA: Diagnosis not present

## 2019-11-16 DIAGNOSIS — E669 Obesity, unspecified: Secondary | ICD-10-CM | POA: Diagnosis not present

## 2019-11-16 MED ORDER — BUPROPION HCL ER (SR) 150 MG PO TB12: 150.0000 mg | ORAL_TABLET | Freq: Every morning | ORAL | 0 refills | Status: DC

## 2019-11-16 MED ORDER — VITAMIN D (ERGOCALCIFEROL) 1.25 MG (50000 UNIT) PO CAPS: 50000.0000 [IU] | ORAL_CAPSULE | ORAL | 0 refills | Status: DC

## 2019-11-17 NOTE — Progress Notes (Signed)
Chief Complaint:   OBESITY Sarah Wong is here to discuss her progress with her obesity treatment plan along with follow-up of her obesity related diagnoses. Sarah Wong is on the Category 1 Plan and states she is following her eating plan approximately 70% of the time. Sarah Wong states she is doing 0 minutes 0 times per week.  Today's visit was #: 9 Starting weight: 160 lbs Starting date: 07/16/2019 Today's weight: 157 lbs Today's date: 11/16/2019 Total lbs lost to date: 3 Total lbs lost since last in-office visit: 0  Interim History: Sarah Wong has been recovering from a prolonged foot injury and multiple surgeries. This of course has affected her exercises but will be started with some specialized physical therapy. She is open to holiday eating strategies.  Subjective:   1. Vitamin D deficiency [place] is stable on Vit D, but her level is not yet at goal. She requests a refill today.  2. Other depression with emotional eating. Sarah Wong has increased stress recently, and is open to hearing about medications to help decrease emotional eating. She denies a history of seizures.  3. At risk for dehydration Sarah Wong is at risk for dehydration due to starting Wellbutrin.  Assessment/Plan:   1. Vitamin D deficiency Low Vitamin D level contributes to fatigue and are associated with obesity, breast, and colon cancer. We will refill prescription Vitamin D for 1 month. Sarah Wong will follow-up for routine testing of Vitamin D, at least 2-3 times per year to avoid over-replacement.  - Vitamin D, Ergocalciferol, (DRISDOL) 1.25 MG (50000 UNIT) CAPS capsule; Take 1 capsule (50,000 Units total) by mouth every 7 (seven) days.  Dispense: 4 capsule; Refill: 0  2. Other depression with emotional eating. Behavior modification techniques were discussed today to help Sarah Wong deal with her emotional/non-hunger eating behaviors. Sarah Wong agreed to start Wellbutrin SR 150 mg q AM with no refills. Orders and follow up as  documented in patient record.   - buPROPion (WELLBUTRIN SR) 150 MG 12 hr tablet; Take 1 tablet (150 mg total) by mouth in the morning.  Dispense: 30 tablet; Refill: 0  3. At risk for dehydration Sarah Wong was given approximately 15 minutes dehydration prevention counseling today. Sarah Wong is at risk for dehydration due to weight loss and current medication(s). She was encouraged to hydrate and monitor fluid status to avoid dehydration as well as weight loss plateaus.   4. Class 1 obesity with serious comorbidity and body mass index (BMI) of 30.0 to 30.9 in adult, unspecified obesity type Sarah Wong is currently in the action stage of change. As such, her goal is to continue with weight loss efforts. She has agreed to the Category 1 Plan.   Behavioral modification strategies: increasing lean protein intake and holiday eating strategies .  Sarah Wong has agreed to follow-up with our clinic in 2 weeks. She was informed of the importance of frequent follow-up visits to maximize her success with intensive lifestyle modifications for her multiple health conditions.   Objective:   Blood pressure 131/76, pulse (!) 59, temperature 97.7 F (36.5 C), height 5' (1.524 m), weight 157 lb (71.2 kg), SpO2 98 %. Body mass index is 30.66 kg/m.  General: Cooperative, alert, well developed, in no acute distress. HEENT: Conjunctivae and lids unremarkable. Cardiovascular: Regular rhythm.  Lungs: Normal work of breathing. Neurologic: No focal deficits.   Lab Results  Component Value Date   CREATININE 0.95 07/16/2019   BUN 15 07/16/2019   NA 144 07/16/2019   K 4.7 07/16/2019   CL 102 07/16/2019  CO2 29 07/16/2019   Lab Results  Component Value Date   ALT 16 07/16/2019   AST 19 07/16/2019   ALKPHOS 67 07/16/2019   BILITOT 0.5 07/16/2019   Lab Results  Component Value Date   HGBA1C 5.5 07/16/2019   HGBA1C 5.4 07/15/2019   HGBA1C 5.4 12/12/2018   Lab Results  Component Value Date   INSULIN 22.4  07/16/2019   Lab Results  Component Value Date   TSH 2.540 07/16/2019   Lab Results  Component Value Date   CHOL 218 (H) 10/14/2019   HDL 58 10/14/2019   LDLCALC 133 (H) 10/14/2019   TRIG 141 10/14/2019   CHOLHDL 3.8 10/14/2019   Lab Results  Component Value Date   WBC 7.2 07/16/2019   HGB 14.5 07/16/2019   HCT 43.9 07/16/2019   MCV 94 07/16/2019   PLT 200 07/16/2019   No results found for: IRON, TIBC, FERRITIN  Attestation Statements:   Reviewed by clinician on day of visit: allergies, medications, problem list, medical history, surgical history, family history, social history, and previous encounter notes.   I, Trixie Dredge, am acting as transcriptionist for Dennard Nip, MD.  I have reviewed the above documentation for accuracy and completeness, and I agree with the above. -  Dennard Nip, MD

## 2019-11-30 ENCOUNTER — Other Ambulatory Visit: Payer: Self-pay

## 2019-11-30 ENCOUNTER — Encounter (INDEPENDENT_AMBULATORY_CARE_PROVIDER_SITE_OTHER): Payer: Self-pay | Admitting: Family Medicine

## 2019-11-30 ENCOUNTER — Ambulatory Visit (INDEPENDENT_AMBULATORY_CARE_PROVIDER_SITE_OTHER): Payer: 59 | Admitting: Family Medicine

## 2019-11-30 VITALS — BP 136/85 | HR 51 | Temp 97.9°F | Ht 60.0 in | Wt 157.0 lb

## 2019-11-30 DIAGNOSIS — Z6830 Body mass index (BMI) 30.0-30.9, adult: Secondary | ICD-10-CM

## 2019-11-30 DIAGNOSIS — F3289 Other specified depressive episodes: Secondary | ICD-10-CM

## 2019-11-30 DIAGNOSIS — E559 Vitamin D deficiency, unspecified: Secondary | ICD-10-CM

## 2019-11-30 DIAGNOSIS — Z9189 Other specified personal risk factors, not elsewhere classified: Secondary | ICD-10-CM | POA: Diagnosis not present

## 2019-11-30 DIAGNOSIS — E669 Obesity, unspecified: Secondary | ICD-10-CM

## 2019-11-30 MED ORDER — BUPROPION HCL ER (SR) 150 MG PO TB12: 150.0000 mg | ORAL_TABLET | Freq: Two times a day (BID) | ORAL | 0 refills | Status: DC

## 2019-11-30 MED ORDER — VITAMIN D (ERGOCALCIFEROL) 1.25 MG (50000 UNIT) PO CAPS: 50000.0000 [IU] | ORAL_CAPSULE | ORAL | 0 refills | Status: DC

## 2019-12-01 NOTE — Progress Notes (Signed)
Chief Complaint:   OBESITY Sarah Wong is here to discuss her progress with her obesity treatment plan along with follow-up of her obesity related diagnoses. Sarah Wong is on the Category 1 Plan and states she is following her eating plan approximately 60% of the time. Sarah Wong states she is exercising 0 minutes 0 times per week.  Today's visit was #: 10 Starting weight: 160 lbs Starting date: 07/16/2019 Today's weight: 157 lbs Today's date: 11/30/2019 Total lbs lost to date: 3 lbs Total lbs lost since last in-office visit: 0 lbs Total weight loss percentage to date: -1.88%  Interim History: Sarah Wong is still not eating enough when following the plan and other times she eats too much stuff off the plan. Most days she skips breakfast and at times, lunch as well! Also, Sarah Wong has left foot pain- probable achilles tendon rupture. She is in a boot and unable to exercise at all.  Plan: Sarah Wong needs to eat all foods on plan. We increased her Wellbutrin dose to twice a day from once a day because of increased depression.  Assessment/Plan:   1. Vitamin D deficiency Sarah Wong's Vitamin D level was 61 on 10/14/2019. She is currently taking prescription vitamin D 50,000 IU each week. She denies nausea, vomiting or muscle weakness.  Plan: Refill Vit D for 1 month, as per below.  Refill- Vitamin D, Ergocalciferol, (DRISDOL) 1.25 MG (50000 UNIT) CAPS capsule; Take 1 capsule (50,000 Units total) by mouth every 7 (seven) days.  Dispense: 4 capsule; Refill: 0  2. Other depression with emotional eating. Sarah Wong denies suicidal ideations. Her cravings are well controlled, and she has very little emotional eating now on Wellbutrin. Sarah Wong is tolerating the Wellbutrin well and denies side effects. She is without cravings and just not eating. When asked more in depth about today, she begins to cry and is worried about her daughter moving to [place].  Plan: Sarah Wong will continue Wellbutrin but increase the dose from once  a day to twice a day. Refill sent for 1 month with change in dose.  Refill and change dose- buPROPion (WELLBUTRIN SR) 150 MG 12 hr tablet; Take 1 tablet (150 mg total) by mouth 2 (two) times daily.  Dispense: 60 tablet; Refill: 0  3. At risk for deficient intake of food Sarah Wong was given approximately 9 minutes of deficit intake of food prevention counseling today. Sarah Wong is at risk for eating too few calories based on current food recall. She was encouraged to focus on meeting caloric and protein goals according to her recommended meal plan.   4. Class 1 obesity with serious comorbidity and body mass index (BMI) of 30.0 to 30.9 in adult, unspecified obesity type Sarah Wong is currently in the action stage of change. As such, her goal is to continue with weight loss efforts. She has agreed to the Category 1 Plan.   Exercise goals: As tolerated with boot. All adults should avoid inactivity. Some physical activity is better than none, and adults who participate in any amount of physical activity gain some health benefits.  Behavioral modification strategies: increasing lean protein intake, no skipping meals, meal planning and cooking strategies and emotional eating strategies.  Sarah Wong has agreed to follow-up with our clinic in 2-3 weeks. She was informed of the importance of frequent follow-up visits to maximize her success with intensive lifestyle modifications for her multiple health conditions.   Objective:   Blood pressure 136/85, pulse (!) 51, temperature 97.9 F (36.6 C), height 5' (1.524 m), weight 157 lb (  71.2 kg), SpO2 98 %. Body mass index is 30.66 kg/m.  General: Cooperative, alert, well developed, in no acute distress. HEENT: Conjunctivae and lids unremarkable. Cardiovascular: Regular rhythm.  Lungs: Normal work of breathing. Neurologic: No focal deficits.   Lab Results  Component Value Date   CREATININE 0.95 07/16/2019   BUN 15 07/16/2019   NA 144 07/16/2019   K 4.7 07/16/2019    CL 102 07/16/2019   CO2 29 07/16/2019   Lab Results  Component Value Date   ALT 16 07/16/2019   AST 19 07/16/2019   ALKPHOS 67 07/16/2019   BILITOT 0.5 07/16/2019   Lab Results  Component Value Date   HGBA1C 5.5 07/16/2019   HGBA1C 5.4 07/15/2019   HGBA1C 5.4 12/12/2018   Lab Results  Component Value Date   INSULIN 22.4 07/16/2019   Lab Results  Component Value Date   TSH 2.540 07/16/2019   Lab Results  Component Value Date   CHOL 218 (H) 10/14/2019   HDL 58 10/14/2019   LDLCALC 133 (H) 10/14/2019   TRIG 141 10/14/2019   CHOLHDL 3.8 10/14/2019   Lab Results  Component Value Date   WBC 7.2 07/16/2019   HGB 14.5 07/16/2019   HCT 43.9 07/16/2019   MCV 94 07/16/2019   PLT 200 07/16/2019    Attestation Statements:   Reviewed by clinician on day of visit: allergies, medications, problem list, medical history, surgical history, family history, social history, and previous encounter notes.  Coral Ceo, am acting as Location manager for [employer], DO.  I have reviewed the above documentation for accuracy and completeness, and I agree with the above. Marjory Sneddon, D.O.  The [place] was signed into law in 2016 which includes the topic of electronic health records.  This provides immediate access to information in MyChart.  This includes consultation notes, operative notes, office notes, lab results and pathology reports.  If you have any questions about what you read please let us know at your next visit so we can discuss your concerns and take corrective action if need be.  We are right here with you.

## 2019-12-21 ENCOUNTER — Encounter (INDEPENDENT_AMBULATORY_CARE_PROVIDER_SITE_OTHER): Payer: Self-pay

## 2019-12-21 ENCOUNTER — Ambulatory Visit (INDEPENDENT_AMBULATORY_CARE_PROVIDER_SITE_OTHER): Payer: 59 | Admitting: Family Medicine

## 2020-01-02 HISTORY — PX: ANKLE SURGERY: SHX546

## 2020-01-05 ENCOUNTER — Ambulatory Visit (INDEPENDENT_AMBULATORY_CARE_PROVIDER_SITE_OTHER): Payer: 59 | Admitting: Family Medicine

## 2020-01-05 ENCOUNTER — Encounter (INDEPENDENT_AMBULATORY_CARE_PROVIDER_SITE_OTHER): Payer: Self-pay

## 2020-06-21 ENCOUNTER — Telehealth: Payer: Self-pay | Admitting: *Deleted

## 2020-06-21 NOTE — Telephone Encounter (Signed)
   [facility] Pre-operative Risk Assessment    Patient Name: Sarah Wong  DOB: May 18, 1965  MRN: [id number]     Request for surgical clearance:  What type of surgery is being performed? LT peroneal tendon reconstruction   When is this surgery scheduled? tbd   What type of clearance is required (medical clearance vs. Pharmacy clearance to hold med vs. Both)? medical  Are there any medications that need to be held prior to surgery and how long? N/a  Practice name and name of physician performing surgery?  [facility] ;DrJohn Hewitt   What is the office phone number? [place]   7.   What is the office fax number? [place]  8.   Anesthesia type (None, local, MAC, general) ? General    Trixie Dredge V 06/21/2020, 3:14 PM  _________________________________________________________________   (provider comments below)

## 2020-06-21 NOTE — Telephone Encounter (Signed)
   Name: Sarah Wong  DOB: 10-07-1965  MRN: [id number]  Primary Cardiologist: Dr. Percival Spanish  Chart reviewed as part of pre-operative protocol coverage. Patient has not been seen within the past 6 months. Last visit was in 09/2019. Because of [name]'s past medical history and time since last visit, she will require a follow-up visit in order to better assess preoperative cardiovascular risk.  Pre-op covering staff: - Please schedule appointment and call patient to inform them. If patient already had an upcoming appointment within acceptable timeframe, please add "pre-op clearance" to the appointment notes so provider is aware. - Please contact requesting surgeon's office via preferred method (i.e, phone, fax) to inform them of need for appointment prior to surgery.  If applicable, this message will also be routed to pharmacy pool and/or primary cardiologist for input on holding anticoagulant/antiplatelet agent as requested below so that this information is available to the clearing provider at time of patient's appointment.   Darreld Mclean, PA-C  06/21/2020, 4:02 PM

## 2020-06-22 NOTE — Telephone Encounter (Signed)
Left message for pt to call the office back to schedule a pre op appt with Dr. Percival Spanish or APP.

## 2020-06-24 NOTE — Telephone Encounter (Signed)
Pt has appt with Dr. Percival Spanish 6/27 for pre op clearance . Will forward clearance notes to MD for upcoming appt. I will send FYI to surgeon's office pt has appt 06/27/20.

## 2020-06-26 NOTE — Progress Notes (Signed)
Cardiology Office Note   Date:  06/27/2020   ID:  Sarah Wong, DOB [birth year], MRN [id number]  PCP:  [name], MD  Cardiologist:   None   No chief complaint on file.     History of Present Illness: Sarah Wong is a 55 y.o. female the patient presents for evaluation having previously been seen by Dr. Rollene Fare.  He followed her for many years for her mitral valve prolapse. Echo from 2014 demonstrated no evidence of prolapse and no regurgitation. He has also followed her because of a family history of early onset heart disease.   A coronary calcium score from 2018 demonstrated no evidence of calcium.   She had a history of MVP but echo last year did not show this and it was essentially normal.    She is to have left  peroneal tendon reconstruction.  She has been in a walking cast.  The patient denies any new symptoms such as chest discomfort, neck or arm discomfort. There has been no new shortness of breath, PND or orthopnea. There have been no reported palpitations, presyncope or syncope.     Past Medical History:  Diagnosis Date   Edema, lower extremity    Fundic gland polyps of stomach, benign    GERD (gastroesophageal reflux disease)    History of kidney stones    HTN (hypertension)    Hyperlipidemia    history of, no medications now   Mild obstructive sleep apnea    PER STUDY 07-07-2010--  MILD OSA/  NO CPAP RX     MVP (mitral valve prolapse)    Palpitations    Renal calculus, bilateral    NON-OBSTRUTIVE   Right ureteral stone    Sleep apnea    Splenic flexure syndrome     Past Surgical History:  Procedure Laterality Date   ANKLE SURGERY Left    CARDIOVASCULAR STRESS TEST  11-08-2009  [facility]   NORMAL PERFUSION STUDY/ NO ISCHEMIA/ EF 63%   CYSTOSCOPY W/ URETERAL STENT PLACEMENT  05/21/2011   Procedure: CYSTOSCOPY WITH RETROGRADE PYELOGRAM/URETERAL STENT PLACEMENT;  Surgeon: Malka So, MD;  Location: [facility];  Service: Urology;   Laterality: Left;   CYSTOSCOPY WITH STENT PLACEMENT Right 11/20/2012   Procedure: CYSTOSCOPY WITH STENT PLACEMENT;  Surgeon: Irine Seal, MD;  Location[facility];  Service: Urology;  Laterality: Right;   CYSTOSCOPY WITH URETEROSCOPY, STONE BASKETRY AND STENT PLACEMENT Right 11/20/2012   Procedure: RIGHT URETEROSCOPY,BALLOON DILITATION, STONE EXTRACTION  ;  Surgeon: Irine Seal, MD;  Location: [facility];  Service: Urology;  Laterality: Right;   EXTRACORPOREAL SHOCK WAVE LITHOTRIPSY  LEFT 05-31-2011/   RIGHT 10-30-2012   EXTRACORPOREAL SHOCK WAVE LITHOTRIPSY Right 03/14/2017   Procedure: RIGHT EXTRACORPOREAL SHOCK WAVE LITHOTRIPSY (ESWL);  Surgeon: Bjorn Loser, MD;  Location: [facility];  Service: Urology;  Laterality: Right;   HAND SURGERY Right 11/2016   trigger finger   HOLMIUM LASER APPLICATION Right 47/42/5956   Procedure: HOLMIUM LASER APPLICATION;  Surgeon: Irine Seal, MD;  Location: [facility];  Service: Urology;  Laterality: Right;   NASAL SEPTUM SURGERY  DEC 2013   RIGHT WRIST EXPLORATION/ CAPSULOTOMY/ TENOLYSIS  03-30-2002   TENDON REPAIR Left 09/06/2016   Procedure: Tenolysis of left peroneal tendons with repair of peroneus brevus;  Surgeon: [name], MD;  Location: [facility];  Service: Orthopedics;  Laterality: Left;   TRANSTHORACIC ECHOCARDIOGRAM  03-27-2012   NORMAL LVF/  EF 55-60%/  NO EVIDENCE MVP   TUBAL LIGATION  1994   VAGINAL HYSTERECTOMY  1999   WRIST GANGLION EXCISION Right 11-14-2001     Current Outpatient Medications  Medication Sig Dispense Refill   B Complex Vitamins (VITAMIN B COMPLEX PO) Take 500 mg by mouth.     DILT-XR 180 MG 24 hr capsule TAKE 1 CAPSULE (180 MG TOTAL) BY MOUTH EVERY MORNING. 90 capsule 3   estradiol (ESTRACE) 1 MG tablet Take 1 mg by mouth daily.     furosemide (LASIX) 20 MG tablet TAKE 1 TABLET BY MOUTH EVERY DAY AS NEEDED 90 tablet 3   metoprolol succinate (TOPROL-XL) 25  MG 24 hr tablet TAKE 1 TABLET BY MOUTH EVERY DAY 90 tablet 3   oxybutynin (DITROPAN) 5 MG tablet Take 5 mg by mouth daily.      pantoprazole (PROTONIX) 40 MG tablet TAKE 1 TABLET BY MOUTH TWICE A DAY 180 tablet 3   Vitamin D, Ergocalciferol, (DRISDOL) 1.25 MG (50000 UNIT) CAPS capsule Take 1 capsule (50,000 Units total) by mouth every 7 (seven) days. 4 capsule 0   XIIDRA 5 % SOLN      No current facility-administered medications for this visit.    Allergies:   Penicillins, Vancomycin, Iohexol, and Ivp dye [iodinated diagnostic agents]    ROS:  Please see the history of present illness.   Otherwise, review of systems are positive for none.   All other systems are reviewed and negative.    PHYSICAL EXAM: VS:  BP 128/62   Pulse (!) 51   Ht 5' (1.524 m)   Wt 158 lb (71.7 kg)   SpO2 99%   BMI 30.86 kg/m  , BMI Body mass index is 30.86 kg/m. GENERAL:  Well appearing NECK:  No jugular venous distention, waveform within normal limits, carotid upstroke brisk and symmetric, no bruits, no thyromegaly LUNGS:  Clear to auscultation bilaterally CHEST:  Unremarkable HEART:  PMI not displaced or sustained,S1 and S2 within normal limits, no S3, no S4, no clicks, no rubs, no murmurs ABD:  Flat, positive bowel sounds normal in frequency in pitch, no bruits, no rebound, no guarding, no midline pulsatile mass, no hepatomegaly, no splenomegaly EXT:  2 plus pulses throughout, no edema, no cyanosis no clubbing  EKG:  EKG is ordered today. The ekg ordered 06/27/2020 demonstrates sinus bradycardia, rate 51, low voltage, borderline interventricular conduction delay, no acute ST-T wave changes, no change from previous.   Recent Labs: 07/16/2019: ALT 16; BUN 15; Creatinine, Ser 0.95; Hemoglobin 14.5; Platelets 200; Potassium 4.7; Sodium 144; TSH 2.540    Lipid Panel    Component Value Date/Time   CHOL 218 (H) 10/14/2019 0825   CHOL 226 (H) 07/16/2019 1537   TRIG 141 10/14/2019 0825   HDL 58 10/14/2019  0825   HDL 58 07/16/2019 1537   CHOLHDL 3.8 10/14/2019 0825   VLDL 31.6 09/29/2018 1125   LDLCALC 133 (H) 10/14/2019 0825      Wt Readings from Last 3 Encounters:  06/27/20 158 lb (71.7 kg)  11/30/19 157 lb (71.2 kg)  11/16/19 157 lb (71.2 kg)      Other studies Reviewed: Additional studies/ records that were reviewed today include: Labs. Review of the above records demonstrates:  Please see elsewhere in the note.     ASSESSMENT AND PLAN:     BRADYCARDIA:    the patient has no symptoms related to this.  No change in therapy.  DYSLIPIDEMIA:     LDL was 133 with  an HDL of 58.  Given the 0 calcium score no medical therapy is indicated but she should continue with aggressive risk reduction.    MVP: I did not see evidence of this echo last year.  No further work up.    PREOP: The patient has no high risk findings.  She has reasonable functional level despite her limitations.  She is not going for high risk procedure.  Therefore, according to ACC/AHA guidelines no further therapy or testing is indicated.  She is at acceptable risk for the planned procedure.  Current medicines are reviewed at length with the patient today.  The patient does not have concerns regarding medicines.  The following changes have been made:  None  Labs/ tests ordered today include:   Orders Placed This Encounter  Procedures   EKG 12-Lead      Disposition:   FU with me in 2 years.     Signed, Minus Breeding, MD  06/27/2020 9:34 AM    [facility]

## 2020-06-27 ENCOUNTER — Ambulatory Visit (INDEPENDENT_AMBULATORY_CARE_PROVIDER_SITE_OTHER): Payer: 59 | Admitting: Cardiology

## 2020-06-27 ENCOUNTER — Other Ambulatory Visit: Payer: Self-pay

## 2020-06-27 ENCOUNTER — Encounter: Payer: Self-pay | Admitting: Cardiology

## 2020-06-27 VITALS — BP 128/62 | HR 51 | Ht 60.0 in | Wt 158.0 lb

## 2020-06-27 DIAGNOSIS — E785 Hyperlipidemia, unspecified: Secondary | ICD-10-CM

## 2020-06-27 DIAGNOSIS — R001 Bradycardia, unspecified: Secondary | ICD-10-CM | POA: Diagnosis not present

## 2020-06-27 DIAGNOSIS — Z01810 Encounter for preprocedural cardiovascular examination: Secondary | ICD-10-CM | POA: Diagnosis not present

## 2020-06-27 DIAGNOSIS — I341 Nonrheumatic mitral (valve) prolapse: Secondary | ICD-10-CM

## 2020-06-27 NOTE — Patient Instructions (Signed)
  Follow-Up: At [facility], you and your health needs are our priority.  As part of our continuing mission to provide you with exceptional heart care, we have created designated Provider Care Teams.  These Care Teams include your primary Cardiologist (physician) and Advanced Practice Providers (APPs -  Physician Assistants and Nurse Practitioners) who all work together to provide you with the care you need, when you need it.  We recommend signing up for the patient portal called "MyChart".  Sign up information is provided on this After Visit Summary.  MyChart is used to connect with patients for Virtual Visits (Telemedicine).  Patients are able to view lab/test results, encounter notes, upcoming appointments, etc.  Non-urgent messages can be sent to your provider as well.   To learn more about what you can do with MyChart, go to [email or web address].    Your next appointment:   2 year(s)  The format for your next appointment:   In Person  Provider:   Minus Breeding, MD

## 2020-08-04 ENCOUNTER — Telehealth: Payer: Self-pay | Admitting: Cardiology

## 2020-08-04 NOTE — Telephone Encounter (Signed)
Pt tested positive for Covid on 08/02/20, Pt currently has cough, runny nose, no headache, tired and pt also has pain  that is going across her back to both shoulder blades. Pt contacted her PCP this morning and was instructed to call heart care since she is a high risk pt and she needs to be seen within 4 hours. Please advise pt further [phone]

## 2020-08-04 NOTE — Telephone Encounter (Signed)
August 04, 2020  Minus Breeding, MD to Me      4:11 PM Agree

## 2020-08-04 NOTE — Telephone Encounter (Signed)
Spoke with patient regarding COVID Patient started Sunday with scratchy throat but thought it was just from being outside at race over weekend Tuesday she started having headache and body aches She called her PCP and they suggested patient go to [facility]/call [facility]  Patient stated she is actually feeling better just coughing and some aches/across back and shoulder blades Advised patient if PCP could not treat her try Urgent Care to see if she could be seen Ok to try Tylenol as needed for pain/fever

## 2020-08-18 ENCOUNTER — Other Ambulatory Visit: Payer: Self-pay

## 2020-08-18 MED ORDER — DILTIAZEM HCL ER 180 MG PO CP24: ORAL_CAPSULE | ORAL | 3 refills | Status: DC

## 2020-08-18 MED ORDER — FUROSEMIDE 20 MG PO TABS: 20.0000 mg | ORAL_TABLET | Freq: Every day | ORAL | 3 refills | Status: DC | PRN

## 2020-08-18 MED ORDER — PANTOPRAZOLE SODIUM 40 MG PO TBEC: 40.0000 mg | DELAYED_RELEASE_TABLET | Freq: Two times a day (BID) | ORAL | 3 refills | Status: DC

## 2020-08-18 MED ORDER — METOPROLOL SUCCINATE ER 25 MG PO TB24: 25.0000 mg | ORAL_TABLET | Freq: Every day | ORAL | 3 refills | Status: DC

## 2020-10-17 ENCOUNTER — Encounter: Payer: 59 | Admitting: Family Medicine

## 2020-12-02 ENCOUNTER — Telehealth: Payer: Self-pay | Admitting: Family Medicine

## 2020-12-02 NOTE — Telephone Encounter (Signed)
Mother didn't clarification it was for her daughter .

## 2020-12-02 NOTE — Telephone Encounter (Signed)
Wrong pt please document in correct chart

## 2020-12-02 NOTE — Telephone Encounter (Signed)
Pt call and stated she think she have bronchitis and want to know what point should she make a appt pt want a call back.

## 2020-12-06 ENCOUNTER — Ambulatory Visit (INDEPENDENT_AMBULATORY_CARE_PROVIDER_SITE_OTHER): Payer: 59 | Admitting: Family Medicine

## 2020-12-06 ENCOUNTER — Encounter: Payer: Self-pay | Admitting: Family Medicine

## 2020-12-06 VITALS — BP 136/80 | HR 57 | Temp 97.7°F | Ht 60.0 in | Wt 169.8 lb

## 2020-12-06 DIAGNOSIS — Z0000 Encounter for general adult medical examination without abnormal findings: Secondary | ICD-10-CM

## 2020-12-06 LAB — CBC WITH DIFFERENTIAL/PLATELET
Basophils Absolute: 0 10*3/uL (ref 0.0–0.1)
Basophils Relative: 0.5 % (ref 0.0–3.0)
Eosinophils Absolute: 0 10*3/uL (ref 0.0–0.7)
Eosinophils Relative: 0.6 % (ref 0.0–5.0)
HCT: 41.4 % (ref 36.0–46.0)
Hemoglobin: 13.5 g/dL (ref 12.0–15.0)
Lymphocytes Relative: 34.7 % (ref 12.0–46.0)
Lymphs Abs: 2.3 10*3/uL (ref 0.7–4.0)
MCHC: 32.7 g/dL (ref 30.0–36.0)
MCV: 96.6 fl (ref 78.0–100.0)
Monocytes Absolute: 0.3 10*3/uL (ref 0.1–1.0)
Monocytes Relative: 4.8 % (ref 3.0–12.0)
Neutro Abs: 4 10*3/uL (ref 1.4–7.7)
Neutrophils Relative %: 59.4 % (ref 43.0–77.0)
Platelets: 178 10*3/uL (ref 150.0–400.0)
RBC: 4.28 Mil/uL (ref 3.87–5.11)
RDW: 13.4 % (ref 11.5–15.5)
WBC: 6.7 10*3/uL (ref 4.0–10.5)

## 2020-12-06 LAB — BASIC METABOLIC PANEL
BUN: 13 mg/dL (ref 6–23)
CO2: 30 mEq/L (ref 19–32)
Calcium: 9.4 mg/dL (ref 8.4–10.5)
Chloride: 104 mEq/L (ref 96–112)
Creatinine, Ser: 0.86 mg/dL (ref 0.40–1.20)
GFR: 74.92 mL/min (ref >60.00)
Glucose, Bld: 96 mg/dL (ref 70–99)
Potassium: 4.5 mEq/L (ref 3.5–5.1)
Sodium: 139 mEq/L (ref 135–145)

## 2020-12-06 LAB — HEPATIC FUNCTION PANEL
ALT: 14 U/L (ref 0–35)
AST: 20 U/L (ref 0–37)
Albumin: 4.2 g/dL (ref 3.5–5.2)
Alkaline Phosphatase: 54 U/L (ref 39–117)
Bilirubin, Direct: 0.1 mg/dL (ref 0.0–0.3)
Total Bilirubin: 0.5 mg/dL (ref 0.2–1.2)
Total Protein: 7.2 g/dL (ref 6.0–8.3)

## 2020-12-06 LAB — TSH: TSH: 2.42 u[IU]/mL (ref 0.35–5.50)

## 2020-12-06 LAB — LIPID PANEL
Cholesterol: 222 mg/dL — ABNORMAL HIGH (ref 0–200)
HDL: 63.7 mg/dL (ref >39.00)
LDL Cholesterol: 133 mg/dL — ABNORMAL HIGH (ref 0–99)
NonHDL: 158.44
Total CHOL/HDL Ratio: 3
Triglycerides: 129 mg/dL (ref 0.0–149.0)
VLDL: 25.8 mg/dL (ref 0.0–40.0)

## 2020-12-06 LAB — HEMOGLOBIN A1C: Hgb A1c MFr Bld: 5.6 % (ref 4.6–6.5)

## 2020-12-06 NOTE — Progress Notes (Signed)
Established Patient Office Visit  Subjective:  Patient ID: Sarah Wong, female    DOB: 1965-06-30  Age: 55 y.o. MRN: [id number]  CC:  Chief Complaint  Patient presents with   Annual Exam    HPI Sarah Wong presents for physical exam.  She has had ongoing problems with her left foot and has had multiple surgeries.  She hopes to get her walking boot off soon.  She had ligament replacement in her lateral left foot.  Chronic problems include history of mitral valve prolapse, history of kidney stones, hyperlipidemia.  Health maintenance reviewed  -Flu vaccine already given -Previous hepatitis C screen negative -Tetanus due 2029 -Colonoscopy due 10/30 -Has already received Shingrix vaccine -Mammogram up-to-date.  She gets this yearly. -Previous hysterectomy for benign disease and no indication for ongoing Pap smears. -She states her COVID vaccines are up-to-date  Social history-she is married.  She has 2 daughters.  One is a Chief Executive Officer here in town.  Her other daughter lives in [place] currently.  She has a few grandchildren.  Patient works for a [employer] in [employer].  Non-smoker.  No regular alcohol.  Family history-her mother had COPD, type 2 diabetes, hypertension, CAD.  Father had lung cancer as well as hypertension and hyperlipidemia.  She has a brother with gout.  Patient's mother also apparently had gout.  Past Medical History:  Diagnosis Date   Edema, lower extremity    Fundic gland polyps of stomach, benign    GERD (gastroesophageal reflux disease)    History of kidney stones    HTN (hypertension)    Hyperlipidemia    history of, no medications now   Mild obstructive sleep apnea    PER STUDY 07-07-2010--  MILD OSA/  NO CPAP RX     MVP (mitral valve prolapse)    Palpitations    Renal calculus, bilateral    NON-OBSTRUTIVE   Right ureteral stone    Sleep apnea    Splenic flexure syndrome     Past Surgical History:  Procedure Laterality Date   ANKLE  SURGERY Left    CARDIOVASCULAR STRESS TEST  11-08-2009  [facility]   NORMAL PERFUSION STUDY/ NO ISCHEMIA/ EF 63%   CYSTOSCOPY W/ URETERAL STENT PLACEMENT  05/21/2011   Procedure: CYSTOSCOPY WITH RETROGRADE PYELOGRAM/URETERAL STENT PLACEMENT;  Surgeon: Malka So, MD;  Location: [facility];  Service: Urology;  Laterality: Left;   CYSTOSCOPY WITH STENT PLACEMENT Right 11/20/2012   Procedure: CYSTOSCOPY WITH STENT PLACEMENT;  Surgeon: Irine Seal, MD;  Location: [facility];  Service: Urology;  Laterality: Right;   CYSTOSCOPY WITH URETEROSCOPY, STONE BASKETRY AND STENT PLACEMENT Right 11/20/2012   Procedure: RIGHT URETEROSCOPY,BALLOON DILITATION, STONE EXTRACTION  ;  Surgeon: Irine Seal, MD;  Location: [place];  Service: Urology;  Laterality: Right;   EXTRACORPOREAL SHOCK WAVE LITHOTRIPSY  LEFT 05-31-2011/   RIGHT 10-30-2012   EXTRACORPOREAL SHOCK WAVE LITHOTRIPSY Right 03/14/2017   Procedure: RIGHT EXTRACORPOREAL SHOCK WAVE LITHOTRIPSY (ESWL);  Surgeon: Bjorn Loser, MD;  Location: [facility];  Service: Urology;  Laterality: Right;   HAND SURGERY Right 11/2016   trigger finger   HOLMIUM LASER APPLICATION Right 57/01/7791   Procedure: HOLMIUM LASER APPLICATION;  Surgeon: Irine Seal, MD;  Location: [facility];  Service: Urology;  Laterality: Right;   NASAL SEPTUM SURGERY  DEC 2013   RIGHT WRIST EXPLORATION/ CAPSULOTOMY/ TENOLYSIS  03-30-2002   TENDON REPAIR Left 09/06/2016   Procedure: Tenolysis of left peroneal tendons  with repair of peroneus brevus;  Surgeon: [name], MD;  Location: [place];  Service: Orthopedics;  Laterality: Left;   TRANSTHORACIC ECHOCARDIOGRAM  03-27-2012   NORMAL LVF/  EF 55-60%/  NO EVIDENCE MVP   TUBAL LIGATION  1994   VAGINAL HYSTERECTOMY  1999   WRIST GANGLION EXCISION Right 11-14-2001    Family History  Problem Relation Age of Onset   Hypertension Mother    Diabetes Mother    COPD Mother     Heart disease Mother 11       "aortic replacement" CABG.  Died age 33   Hyperlipidemia Mother    Sudden death Mother    Thyroid disease Mother    Sleep apnea Mother    Obesity Mother    Hypertension Father    Heart failure Father        open heart surgery   Hyperlipidemia Father    Heart disease Father    Cancer Father 75       lung cancer   Gout Brother    Stroke Maternal Grandmother    Colon polyps Neg Hx    Rectal cancer Neg Hx    Stomach cancer Neg Hx    Esophageal cancer Neg Hx     Social History   Socioeconomic History   Marital status: Married    Spouse name: Not on file   Number of children: 2   Years of education: Not on file   Highest education level: Not on file  Occupational History   Occupation: Architect  Tobacco Use   Smoking status: Never   Smokeless tobacco: Never  Vaping Use   Vaping Use: Never used  Substance and Sexual Activity   Alcohol use: No   Drug use: No   Sexual activity: Not on file  Other Topics Concern   Not on file  Social History Narrative   Lives with husband and two children.     Social Determinants of Health   Financial Resource Strain: Not on file  Food Insecurity: Not on file  Transportation Needs: Not on file  Physical Activity: Not on file  Stress: Not on file  Social Connections: Not on file  Intimate Partner Violence: Not on file    Outpatient Medications Prior to Visit  Medication Sig Dispense Refill   B Complex Vitamins (VITAMIN B COMPLEX PO) Take 500 mg by mouth.     diltiazem (DILT-XR) 180 MG 24 hr capsule TAKE 1 CAPSULE (180 MG TOTAL) BY MOUTH EVERY MORNING. 90 capsule 3   estradiol (ESTRACE) 1 MG tablet Take 1 mg by mouth daily.     furosemide (LASIX) 20 MG tablet Take 1 tablet (20 mg total) by mouth daily as needed. 90 tablet 3   metoprolol succinate (TOPROL-XL) 25 MG 24 hr tablet Take 1 tablet (25 mg total) by mouth daily. 90 tablet 3   oxybutynin (DITROPAN) 5 MG tablet Take 5 mg by mouth daily.       pantoprazole (PROTONIX) 40 MG tablet Take 1 tablet (40 mg total) by mouth 2 (two) times daily. 180 tablet 3   Vitamin D, Ergocalciferol, (DRISDOL) 1.25 MG (50000 UNIT) CAPS capsule Take 1 capsule (50,000 Units total) by mouth every 7 (seven) days. 4 capsule 0   XIIDRA 5 % SOLN      No facility-administered medications prior to visit.    Allergies  Allergen Reactions   Penicillins Hives and Rash   Vancomycin Swelling    Other reaction(s): Redness  Iohexol Hives     Code: HIVES, Desc: HIVES WITH IV CONTRAST MEDIA- PT REQUIRES 13 HR PRE-MEDS, Onset Date: [id number]    Ivp Dye [Iodinated Diagnostic Agents] Hives    ROS Review of Systems  Constitutional:  Negative for activity change, appetite change, fatigue, fever and unexpected weight change.  HENT:  Negative for ear pain, hearing loss, sore throat and trouble swallowing.   Eyes:  Negative for visual disturbance.  Respiratory:  Negative for cough and shortness of breath.   Cardiovascular:  Negative for chest pain and palpitations.  Gastrointestinal:  Negative for abdominal pain, blood in stool, constipation and diarrhea.  Genitourinary:  Negative for dysuria and hematuria.  Musculoskeletal:  Negative for back pain and myalgias.  Skin:  Negative for rash.  Neurological:  Negative for dizziness, syncope and headaches.  Hematological:  Negative for adenopathy.  Psychiatric/Behavioral:  Negative for confusion and dysphoric mood.      Objective:    Physical Exam Constitutional:      Appearance: She is well-developed.  HENT:     Head: Normocephalic and atraumatic.     Ears:     Comments: Scarring of both eardrums.  No acute changes. Eyes:     Pupils: Pupils are equal, round, and reactive to light.  Neck:     Thyroid: No thyromegaly.  Cardiovascular:     Rate and Rhythm: Normal rate and regular rhythm.     Heart sounds: Normal heart sounds. No murmur heard. Pulmonary:     Effort: No respiratory distress.     Breath  sounds: Normal breath sounds. No wheezing or rales.  Abdominal:     General: Bowel sounds are normal. There is no distension.     Palpations: Abdomen is soft. There is no mass.     Tenderness: There is no abdominal tenderness. There is no guarding or rebound.  Musculoskeletal:        General: Normal range of motion.     Cervical back: Normal range of motion and neck supple.     Comments: She has a walking boot on left lower extremity  Lymphadenopathy:     Cervical: No cervical adenopathy.  Skin:    Findings: No rash.  Neurological:     Mental Status: She is alert and oriented to person, place, and time.     Cranial Nerves: No cranial nerve deficit.  Psychiatric:        Behavior: Behavior normal.        Thought Content: Thought content normal.        Judgment: Judgment normal.    BP 136/80 (BP Location: Left Arm, Patient Position: Sitting, Cuff Size: Normal)   Pulse (!) 57   Temp 97.7 F (36.5 C) (Oral)   Ht 5' (1.524 m)   Wt 169 lb 12.8 oz (77 kg)   SpO2 97%   BMI 33.16 kg/m  Wt Readings from Last 3 Encounters:  12/06/20 169 lb 12.8 oz (77 kg)  06/27/20 158 lb (71.7 kg)  11/30/19 157 lb (71.2 kg)     Health Maintenance Due  Topic Date Due   HIV Screening  Never done   COVID-19 Vaccine (2 - Booster for Janssen series) 05/08/2019    There are no preventive care reminders to display for this patient.  Lab Results  Component Value Date   TSH 2.540 07/16/2019   Lab Results  Component Value Date   WBC 7.2 07/16/2019   HGB 14.5 07/16/2019   HCT 43.9 07/16/2019   MCV  94 07/16/2019   PLT 200 07/16/2019   Lab Results  Component Value Date   NA 144 07/16/2019   K 4.7 07/16/2019   CO2 29 07/16/2019   GLUCOSE 103 (H) 07/16/2019   BUN 15 07/16/2019   CREATININE 0.95 07/16/2019   BILITOT 0.5 07/16/2019   ALKPHOS 67 07/16/2019   AST 19 07/16/2019   ALT 16 07/16/2019   PROT 7.5 07/16/2019   ALBUMIN 4.7 07/16/2019   CALCIUM 9.7 07/16/2019   ANIONGAP 6 08/30/2016    GFR 59.54 (L) 09/29/2018   Lab Results  Component Value Date   CHOL 218 (H) 10/14/2019   Lab Results  Component Value Date   HDL 58 10/14/2019   Lab Results  Component Value Date   LDLCALC 133 (H) 10/14/2019   Lab Results  Component Value Date   TRIG 141 10/14/2019   Lab Results  Component Value Date   CHOLHDL 3.8 10/14/2019   Lab Results  Component Value Date   HGBA1C 5.5 07/16/2019      Assessment & Plan:   Problem List Items Addressed This Visit   None Visit Diagnoses     Physical exam    -  Primary   Relevant Orders   Basic metabolic panel   Lipid panel   CBC with Differential/Platelet   TSH   Hepatic function panel   Hemoglobin A1c     Health maintenance reviewed.  Her immunizations are up-to-date.  She gets yearly mammograms.  Colonoscopy up-to-date.  Obtain screening labs as above.  No indication for further Pap smears with prior hysterectomy  Very strong family history of CAD.  She is followed regular by cardiology.  Has had previous stress testing which was unremarkable  No orders of the defined types were placed in this encounter.   Follow-up: No follow-ups on file.    Carolann Littler, MD

## 2021-03-14 ENCOUNTER — Encounter: Payer: Self-pay | Admitting: Gastroenterology

## 2021-03-14 ENCOUNTER — Ambulatory Visit (INDEPENDENT_AMBULATORY_CARE_PROVIDER_SITE_OTHER): Payer: 59 | Admitting: Gastroenterology

## 2021-03-14 ENCOUNTER — Other Ambulatory Visit (INDEPENDENT_AMBULATORY_CARE_PROVIDER_SITE_OTHER): Payer: 59

## 2021-03-14 VITALS — BP 116/68 | HR 68 | Ht 60.0 in | Wt 166.0 lb

## 2021-03-14 DIAGNOSIS — K219 Gastro-esophageal reflux disease without esophagitis: Secondary | ICD-10-CM | POA: Diagnosis not present

## 2021-03-14 DIAGNOSIS — R1011 Right upper quadrant pain: Secondary | ICD-10-CM

## 2021-03-14 LAB — COMPREHENSIVE METABOLIC PANEL
ALT: 13 U/L (ref 0–35)
AST: 16 U/L (ref 0–37)
Albumin: 4.1 g/dL (ref 3.5–5.2)
Alkaline Phosphatase: 62 U/L (ref 39–117)
BUN: 14 mg/dL (ref 6–23)
CO2: 27 mEq/L (ref 19–32)
Calcium: 8.8 mg/dL (ref 8.4–10.5)
Chloride: 104 mEq/L (ref 96–112)
Creatinine, Ser: 0.92 mg/dL (ref 0.40–1.20)
GFR: 68.97 mL/min (ref >60.00)
Glucose, Bld: 107 mg/dL — ABNORMAL HIGH (ref 70–99)
Potassium: 4 mEq/L (ref 3.5–5.1)
Sodium: 139 mEq/L (ref 135–145)
Total Bilirubin: 0.5 mg/dL (ref 0.2–1.2)
Total Protein: 6.8 g/dL (ref 6.0–8.3)

## 2021-03-14 LAB — CBC WITH DIFFERENTIAL/PLATELET
Basophils Absolute: 0 10*3/uL (ref 0.0–0.1)
Basophils Relative: 0.6 % (ref 0.0–3.0)
Eosinophils Absolute: 0.1 10*3/uL (ref 0.0–0.7)
Eosinophils Relative: 1.4 % (ref 0.0–5.0)
HCT: 38.6 % (ref 36.0–46.0)
Hemoglobin: 13.2 g/dL (ref 12.0–15.0)
Lymphocytes Relative: 33.4 % (ref 12.0–46.0)
Lymphs Abs: 2.2 10*3/uL (ref 0.7–4.0)
MCHC: 34.1 g/dL (ref 30.0–36.0)
MCV: 94.5 fl (ref 78.0–100.0)
Monocytes Absolute: 0.3 10*3/uL (ref 0.1–1.0)
Monocytes Relative: 4.8 % (ref 3.0–12.0)
Neutro Abs: 4 10*3/uL (ref 1.4–7.7)
Neutrophils Relative %: 59.8 % (ref 43.0–77.0)
Platelets: 183 10*3/uL (ref 150.0–400.0)
RBC: 4.09 Mil/uL (ref 3.87–5.11)
RDW: 12.9 % (ref 11.5–15.5)
WBC: 6.7 10*3/uL (ref 4.0–10.5)

## 2021-03-14 LAB — LIPASE: Lipase: 23 U/L (ref 11.0–59.0)

## 2021-03-14 MED ORDER — DICYCLOMINE HCL 20 MG PO TABS: 20.0000 mg | ORAL_TABLET | Freq: Three times a day (TID) | ORAL | 11 refills | Status: DC

## 2021-03-14 NOTE — Patient Instructions (Addendum)
Your provider has requested that you go to the basement level for lab work before leaving today. Press "B" on the elevator. The lab is located at the first door on the left as you exit the elevator. ? ?We have sent the following medications to your pharmacy for you to pick up at your convenience: dicyclomine.  ? ?You have been scheduled for a CT scan of the abdomen and pelvis at [facility] ([address]---this is in the same building as [facility]).  ? ?You are scheduled on 03/21/21 at 11:00am. You should arrive 15 minutes prior to your appointment time for registration. Please follow the written instructions below on the day of your exam: ? ?WARNING: IF YOU ARE ALLERGIC TO IODINE/X-RAY DYE, PLEASE NOTIFY RADIOLOGY IMMEDIATELY AT [phone]! YOU WILL BE GIVEN A 13 HOUR PREMEDICATION PREP. ? ?1) Do not eat or drink anything after 7:00am (4 hours prior to your test) ?2) You have been given 2 bottles of oral contrast to drink. The solution may taste better if refrigerated, but do NOT add ice or any other liquid to this solution. Shake well before drinking. ?  ? Drink 1 bottle of contrast @ 9:00am (2 hours prior to your exam) ? Drink 1 bottle of contrast @ 10:00am (1 hour prior to your exam) ? ?You may take any medications as prescribed with a small amount of water, if necessary. If you take any of the following medications: METFORMIN, GLUCOPHAGE, GLUCOVANCE, AVANDAMET, RIOMET, FORTAMET, ACTOPLUS MET, JANUMET, GLUMETZA or METAGLIP, you MAY be asked to HOLD this medication 48 hours AFTER the exam. ? ?The purpose of you drinking the oral contrast is to aid in the visualization of your intestinal tract. The contrast solution may cause some diarrhea. Depending on your individual set of symptoms, you may also receive an intravenous injection of x-ray contrast/dye. Plan on being at [facility] for 30 minutes or longer, depending on the type of exam you are having  performed. ? ?This test typically takes 30-45 minutes to complete. ? ?If you have any questions regarding your exam or if you need to reschedule, you may call the CT department at [phone] between the hours of 8:00 am and 5:00 pm, Monday-Friday. ? ?___________________________________________________________ ? ?The [facility] providers would like to encourage you to use MYCHART to communicate with providers for non-urgent requests or questions.  Due to long hold times on the telephone, sending your provider a message by MYCHART may be a faster and more efficient way to get a response.  Please allow 48 business hours for a response.  Please remember that this is for non-urgent requests.  ? ?Due to recent changes in healthcare laws, you may see the results of your imaging and laboratory studies on MyChart before your provider has had a chance to review them.  We understand that in some cases there may be results that are confusing or concerning to you. Not all laboratory results come back in the same time frame and the provider may be waiting for multiple results in order to interpret others.  Please give us 48 hours in order for your provider to thoroughly review all the results before contacting the office for clarification of your results.  ? ?Thank you for choosing me and [facility]. ? ?Malcolm T. Stark, Jr., MD., FACG ? ? ? ? ? ?

## 2021-03-14 NOTE — Progress Notes (Signed)
? ? ?  History of Present Illness: This is a 56 year old female with RUQ pain.  She relates 1 month of a dull right upper quadrant pain that radiates toward her periumbilical area.  Symptoms are sometimes exacerbated by meals but are present all the time.  She has intermittent postprandial urgent diarrhea.  She relates frequent with nausea  and a change in her taste - many foods no longer taste good to her.  Her appetite is decreased. Her reflux symptoms are under good control on pantoprazole twice daily.  She notes a slight bulge just above her umbilicus. CBC, LFTs, BMET, TSH were normal in Dec 2022. Denies weight loss, constipation, change in stool caliber, melena, hematochezia, nausea, vomiting, dysphagia, chest pain.  ? ?Current Medications, Allergies, Past Medical History, Past Surgical History, Family History and Social History were reviewed in [employer] record. ? ? ?Physical Exam: ?General: Well developed, well nourished, no acute distress ?Head: Normocephalic and atraumatic ?Eyes: Sclerae anicteric, EOMI ?Ears: Normal auditory acuity ?Mouth: Not examined, mask on during Covid-19 pandemic ?Lungs: Clear throughout to auscultation ?Heart: Regular rate and rhythm; no murmurs, rubs or bruits ?Abdomen: Soft, mild RUQ tenderness and non distended. No masses, hepatosplenomegaly noted.  Small rounded nontender bulge just above the umbilicus noticeable when standing. Normal Bowel sounds ?Rectal: Not done ?Musculoskeletal: Symmetrical with no gross deformities  ?Pulses:  Normal pulses noted ?Extremities: No clubbing, cyanosis, edema or deformities noted ?Neurological: Alert oriented x 4, grossly nonfocal ?Psychological:  Alert and cooperative. Normal mood and affect ? ? ?Assessment and Recommendations: ? ?RUQ pain, periumbilical small bulge, nausea, change in taste, decreased appetite.  Rule out cholelithiasis, hernia, neoplasm.  CMP, CBC and lipase today.  Schedule CT AP without contrast due to  allergy.  Resume dicyclomine 20 mg p.o. AC 3 times daily. REV in 1 month. ?GERD.  Closely follow antireflux measures.  Continue pantoprazole 40 mg p.o. twice daily. ?CRC screening, average risk.  A 10-year interval screening colonoscopy is recommended in October 2030. ?

## 2021-03-21 ENCOUNTER — Inpatient Hospital Stay: Admission: RE | Admit: 2021-03-21 | Payer: 59 | Source: Ambulatory Visit

## 2021-03-23 ENCOUNTER — Ambulatory Visit (INDEPENDENT_AMBULATORY_CARE_PROVIDER_SITE_OTHER)
Admission: RE | Admit: 2021-03-23 | Discharge: 2021-03-23 | Disposition: A | Discharge status: Home / Self Care | Payer: 59 | Source: Ambulatory Visit | Attending: Gastroenterology | Admitting: Gastroenterology

## 2021-03-23 ENCOUNTER — Other Ambulatory Visit: Payer: Self-pay

## 2021-03-23 DIAGNOSIS — K219 Gastro-esophageal reflux disease without esophagitis: Secondary | ICD-10-CM

## 2021-03-23 DIAGNOSIS — R1011 Right upper quadrant pain: Secondary | ICD-10-CM

## 2021-04-17 ENCOUNTER — Ambulatory Visit: Payer: 59 | Admitting: Gastroenterology

## 2021-05-03 ENCOUNTER — Telehealth: Payer: Self-pay | Admitting: Gastroenterology

## 2021-05-03 ENCOUNTER — Ambulatory Visit: Payer: 59 | Admitting: Gastroenterology

## 2021-05-03 NOTE — Telephone Encounter (Signed)
Good Morning Dr. Fuller Plan, ? ?Patient called to reschedule her appointment with you this morning at 9:50 due to waking up with a sore throat and not feeling well. ? ?Patient was rescheduled for 6/7 at 8:30.  ?

## 2021-05-24 ENCOUNTER — Other Ambulatory Visit: Payer: Self-pay | Admitting: Obstetrics & Gynecology

## 2021-05-24 DIAGNOSIS — N644 Mastodynia: Secondary | ICD-10-CM

## 2021-06-02 ENCOUNTER — Ambulatory Visit
Admission: RE | Admit: 2021-06-02 | Discharge: 2021-06-02 | Disposition: A | Discharge status: Home / Self Care | Payer: 59 | Source: Ambulatory Visit | Attending: Obstetrics & Gynecology | Admitting: Obstetrics & Gynecology

## 2021-06-02 DIAGNOSIS — N644 Mastodynia: Secondary | ICD-10-CM

## 2021-06-07 ENCOUNTER — Encounter: Payer: Self-pay | Admitting: Gastroenterology

## 2021-06-07 ENCOUNTER — Ambulatory Visit (INDEPENDENT_AMBULATORY_CARE_PROVIDER_SITE_OTHER): Payer: 59 | Admitting: Gastroenterology

## 2021-06-07 VITALS — BP 110/64 | HR 50 | Ht 60.0 in | Wt 168.0 lb

## 2021-06-07 DIAGNOSIS — R1013 Epigastric pain: Secondary | ICD-10-CM | POA: Diagnosis not present

## 2021-06-07 DIAGNOSIS — K219 Gastro-esophageal reflux disease without esophagitis: Secondary | ICD-10-CM

## 2021-06-07 NOTE — Patient Instructions (Signed)
Take your dicyclomine before meals three times a day and not as needed.   You have been scheduled for an endoscopy. Please follow written instructions given to you at your visit today. If you use inhalers (even only as needed), please bring them with you on the day of your procedure.  The [facility] providers would like to encourage you to use [facility] to communicate with providers for non-urgent requests or questions.  Due to long hold times on the telephone, sending your provider a message by [facility] may be a faster and more efficient way to get a response.  Please allow 48 business hours for a response.  Please remember that this is for non-urgent requests.   Due to recent changes in healthcare laws, you may see the results of your imaging and laboratory studies on MyChart before your provider has had a chance to review them.  We understand that in some cases there may be results that are confusing or concerning to you. Not all laboratory results come back in the same time frame and the provider may be waiting for multiple results in order to interpret others.  Please give [place] 48 hours in order for your provider to thoroughly review all the results before contacting the office for clarification of your results.   Thank you for choosing me and [facility].  Pricilla Riffle. Dagoberto Ligas., MD., Marval Regal

## 2021-06-07 NOTE — Progress Notes (Signed)
Assessment     Epigastric, upper abdominal, costochondral pain - etiology unclear.  Possible musculoskeletal pain.  Rule out ulcer, gastritis, esophagitis. IBS - D GERD   Recommendations    Change dicyclomine to 20 mg p.o. 30 to 60 minutes before meals (not as needed) Continue pantoprazole 40 mg daily and closely follow antireflux measures Trial of acetaminophen or ibuprofen for upper abdominal pain Schedule EGD. The risks (including bleeding, perforation, infection, missed lesions, medication reactions and possible hospitalization or surgery if complications occur), benefits, and alternatives to endoscopy with possible biopsy and possible dilation were discussed with the patient and they consent to proceed.     HPI    This is a 56 year old female who relates pain and tightness across her upper abdomen and  postprandial nausea with urgent loose bowel movements following most meals.  Her reflux symptoms are well controlled as long as she remains on daily pantoprazole.  She has taken dicyclomine intermittently but has not taken it regularly before meals. CT AP and blood work performed in March for the same complaints were unremarkable.  EGD March 2020 - Normal esophagus. - Gastritis. Biopsied.  Slight chronic inflammation, H pylori negative - Multiple gastric polyps. Biopsied.  Fundic gland polyps - Normal duodenal bulb and second portion of the duodenum.   Labs / Imaging       Latest Ref Rng & Units 03/14/2021    9:01 AM 12/06/2020   10:58 AM 07/16/2019    3:37 PM  Hepatic Function  Total Protein 6.0 - 8.3 g/dL 6.8   7.2   7.5    Albumin 3.5 - 5.2 g/dL 4.1   4.2   4.7    AST 0 - 37 U/L '16   20   19    ' ALT 0 - 35 U/L '13   14   16    ' Alk Phosphatase 39 - 117 U/L 62   54   67    Total Bilirubin 0.2 - 1.2 mg/dL 0.5   0.5   0.5    Bilirubin, Direct 0.0 - 0.3 mg/dL  0.1          Latest Ref Rng & Units 03/14/2021    9:01 AM 12/06/2020   10:58 AM 07/16/2019    3:37 PM  CBC   WBC 4.0 - 10.5 K/uL 6.7   6.7   7.2    Hemoglobin 12.0 - 15.0 g/dL 13.2   13.5   14.5    Hematocrit 36.0 - 46.0 % 38.6   41.4   43.9    Platelets 150.0 - 400.0 K/uL 183.0   178.0   200      CT AP 03/24/2021 1. No acute process identified. 2. Bilateral nephrolithiasis.  Current Medications, Allergies, Past Medical History, Past Surgical History, Family History and Social History were reviewed in [employer] record.   Physical Exam: General: Well developed, well nourished, no acute distress Head: Normocephalic and atraumatic Eyes: Sclerae anicteric, EOMI Ears: Normal auditory acuity Mouth: Not examined Lungs: Clear throughout to auscultation Heart: Regular rate and rhythm; no murmurs, rubs or bruits Abdomen: Soft, minimal tenderness across her upper abdomen and costochondral margin and non distended. No masses, hepatosplenomegaly or hernias noted. Normal Bowel sounds Rectal: Not done Musculoskeletal: Symmetrical with no gross deformities  Pulses:  Normal pulses noted Extremities: No clubbing, cyanosis, edema or deformities noted Neurological: Alert oriented x 4, grossly nonfocal Psychological:  Alert and cooperative. Normal mood and affect  Sarah Wong. Fuller Plan, MD 06/07/2021, 8:33 AM

## 2021-07-11 ENCOUNTER — Ambulatory Visit (INDEPENDENT_AMBULATORY_CARE_PROVIDER_SITE_OTHER): Payer: 59 | Admitting: Family Medicine

## 2021-07-11 ENCOUNTER — Encounter: Payer: Self-pay | Admitting: Family Medicine

## 2021-07-11 VITALS — BP 146/80 | HR 60 | Temp 97.4°F | Ht 60.0 in | Wt 170.5 lb

## 2021-07-11 DIAGNOSIS — R0781 Pleurodynia: Secondary | ICD-10-CM | POA: Diagnosis not present

## 2021-07-11 NOTE — Progress Notes (Signed)
Established Patient Office Visit  Subjective   Patient ID: Sarah Wong, female    DOB: January 28, 1965  Age: 56 y.o. MRN: [id number]  Chief Complaint  Patient presents with   Fall    Patient complains of fall, x1 day, Patient repots pain on right side    HPI   Sarah Wong fell Sunday in her bathroom.  She fell against the tub side and landed on her right anterior rib cage area.  No other injuries reported.  Has significant right rib pain now.  No visible swelling.  No visible ecchymosis.  No head injury.  No loss of consciousness.  Some pain with deep breathing.  No dyspnea.  Past Medical History:  Diagnosis Date   Edema, lower extremity    Fundic gland polyps of stomach, benign    GERD (gastroesophageal reflux disease)    History of kidney stones    HTN (hypertension)    Hyperlipidemia    history of, no medications now   Mild obstructive sleep apnea    PER STUDY 07-07-2010--  MILD OSA/  NO CPAP RX     MVP (mitral valve prolapse)    Palpitations    Renal calculus, bilateral    NON-OBSTRUTIVE   Right ureteral stone    Sleep apnea    Splenic flexure syndrome    Past Surgical History:  Procedure Laterality Date   ANKLE SURGERY Left    CARDIOVASCULAR STRESS TEST  11-08-2009  DR [facility]   NORMAL PERFUSION STUDY/ NO ISCHEMIA/ EF 63%   CYSTOSCOPY W/ URETERAL STENT PLACEMENT  05/21/2011   Procedure: CYSTOSCOPY WITH RETROGRADE PYELOGRAM/URETERAL STENT PLACEMENT;  Surgeon: Malka So, MD;  Location: [facility];  Service: Urology;  Laterality: Left;   CYSTOSCOPY WITH STENT PLACEMENT Right 11/20/2012   Procedure: CYSTOSCOPY WITH STENT PLACEMENT;  Surgeon: Irine Seal, MD;  Location: [facility];  Service: Urology;  Laterality: Right;   CYSTOSCOPY WITH URETEROSCOPY, STONE BASKETRY AND STENT PLACEMENT Right 11/20/2012   Procedure: RIGHT URETEROSCOPY,BALLOON DILITATION, STONE EXTRACTION  ;  Surgeon: Irine Seal, MD;  Location: [place];  Service: Urology;   Laterality: Right;   EXTRACORPOREAL SHOCK WAVE LITHOTRIPSY  LEFT 05-31-2011/   RIGHT 10-30-2012   EXTRACORPOREAL SHOCK WAVE LITHOTRIPSY Right 03/14/2017   Procedure: RIGHT EXTRACORPOREAL SHOCK WAVE LITHOTRIPSY (ESWL);  Surgeon: Bjorn Loser, MD;  Location: [facility];  Service: Urology;  Laterality: Right;   HAND SURGERY Right 11/2016   trigger finger   HOLMIUM LASER APPLICATION Right 67/34/1937   Procedure: HOLMIUM LASER APPLICATION;  Surgeon: Irine Seal, MD;  Location: [facility];  Service: Urology;  Laterality: Right;   NASAL SEPTUM SURGERY  DEC 2013   RIGHT WRIST EXPLORATION/ CAPSULOTOMY/ TENOLYSIS  03-30-2002   TENDON REPAIR Left 09/06/2016   Procedure: Tenolysis of left peroneal tendons with repair of peroneus brevus;  Surgeon: [name], MD;  Location: [place];  Service: Orthopedics;  Laterality: Left;   TRANSTHORACIC ECHOCARDIOGRAM  03-27-2012   NORMAL LVF/  EF 55-60%/  NO EVIDENCE MVP   TUBAL LIGATION  1994   VAGINAL HYSTERECTOMY  1999   WRIST GANGLION EXCISION Right 11-14-2001    reports that she has never smoked. She has been exposed to tobacco smoke. She has never used smokeless tobacco. She reports that she does not drink alcohol and does not use drugs. family history includes COPD in her mother; Cancer (age of onset: 13) in her father; Diabetes in her mother; Gout in her brother; Heart  disease in her father; Heart disease (age of onset: 13) in her mother; Heart failure in her father; Hyperlipidemia in her father and mother; Hypertension in her father and mother; Obesity in her mother; Sleep apnea in her mother; Stroke in her maternal grandmother; Sudden death in her mother; Thyroid disease in her mother. Allergies  Allergen Reactions   Penicillins Hives and Rash   Vancomycin Swelling    Other reaction(s): Redness   Iohexol Hives     Code: HIVES, Desc: HIVES WITH IV CONTRAST MEDIA- PT REQUIRES 13 HR PRE-MEDS, Onset Date: [id number]    Ivp Dye  [Iodinated Contrast Media] Hives    Review of Systems  Constitutional:  Negative for chills and fever.  Respiratory:  Negative for cough, hemoptysis and shortness of breath.   Cardiovascular:  Negative for chest pain.  Neurological:  Negative for headaches.      Objective:     BP (!) 146/80 (BP Location: Left Arm, Patient Position: Sitting, Cuff Size: Normal)   Pulse 60   Temp (!) 97.4 F (36.3 C) (Oral)   Ht 5' (1.524 m)   Wt 170 lb 8 oz (77.3 kg)   SpO2 98%   BMI 33.30 kg/m    Physical Exam Vitals reviewed.  Constitutional:      Appearance: Normal appearance.  Cardiovascular:     Rate and Rhythm: Normal rate and regular rhythm.  Pulmonary:     Effort: Pulmonary effort is normal.     Breath sounds: Normal breath sounds. No wheezing or rales.     Comments: No visible ecchymosis right anterior rib cage.  Tender over the right anterior ribs around seventh to eighth rib Neurological:     Mental Status: She is alert.      No results found for any visits on 07/11/21.    The 10-year ASCVD risk score (Arnett DK, et al., 2019) is: 4.6%    Assessment & Plan:   Right anterior rib pain following fall.  Minimally contusion.  We discussed that she may have rib fracture or fractures but she has good breath sounds and x-ray would not likely change management.  She prefers to avoid pain medication and will continue with over-the-counter medications.  She had question regarding wrap such as Ace wrap and we cautioned about not restricting her breathing but if she can wear Ace wrap without restricting deep breathing and this helps for comfort would be an option.  She has had previous rib fractures and knows this will take several weeks to heal if fractured  No follow-ups on file.    Sarah Littler, MD

## 2021-07-21 ENCOUNTER — Encounter: Payer: Self-pay | Admitting: Gastroenterology

## 2021-07-21 ENCOUNTER — Ambulatory Visit (AMBULATORY_SURGERY_CENTER): Payer: 59 | Admitting: Gastroenterology

## 2021-07-21 VITALS — BP 108/58 | HR 41 | Temp 97.5°F | Resp 12 | Ht 60.0 in | Wt 168.0 lb

## 2021-07-21 DIAGNOSIS — R1013 Epigastric pain: Secondary | ICD-10-CM

## 2021-07-21 DIAGNOSIS — K219 Gastro-esophageal reflux disease without esophagitis: Secondary | ICD-10-CM

## 2021-07-21 DIAGNOSIS — K2960 Other gastritis without bleeding: Secondary | ICD-10-CM | POA: Diagnosis not present

## 2021-07-21 DIAGNOSIS — K317 Polyp of stomach and duodenum: Secondary | ICD-10-CM

## 2021-07-21 DIAGNOSIS — K2950 Unspecified chronic gastritis without bleeding: Secondary | ICD-10-CM | POA: Diagnosis not present

## 2021-07-21 MED ORDER — SODIUM CHLORIDE 0.9 % IV SOLN: 500.0000 mL | INTRAVENOUS | Status: DC

## 2021-07-21 NOTE — Progress Notes (Signed)
Called to room to assist during endoscopic procedure.  Patient ID and intended procedure confirmed with present staff. Received instructions for my participation in the procedure from the performing physician.  

## 2021-07-21 NOTE — Progress Notes (Signed)
History & Physical  Primary Care Physician:  [name], MD Primary Gastroenterologist: Lucio Edward, MD  CHIEF COMPLAINT:  Epigastric pain and GERD   HPI: Sarah Wong is a 56 y.o. female with epigastric pain and GERD for EGD.   Past Medical History:  Diagnosis Date   Edema, lower extremity    Fundic gland polyps of stomach, benign    GERD (gastroesophageal reflux disease)    History of kidney stones    HTN (hypertension)    Hyperlipidemia    history of, no medications now   Mild obstructive sleep apnea    PER STUDY 07-07-2010--  MILD OSA/  NO CPAP RX     MVP (mitral valve prolapse)    Palpitations    Renal calculus, bilateral    NON-OBSTRUTIVE   Right ureteral stone    Sleep apnea    Splenic flexure syndrome     Past Surgical History:  Procedure Laterality Date   ANKLE SURGERY Left    CARDIOVASCULAR STRESS TEST  11-08-2009  DR [facility]   NORMAL PERFUSION STUDY/ NO ISCHEMIA/ EF 63%   CYSTOSCOPY W/ URETERAL STENT PLACEMENT  05/21/2011   Procedure: CYSTOSCOPY WITH RETROGRADE PYELOGRAM/URETERAL STENT PLACEMENT;  Surgeon: Malka So, MD;  Location: [facility];  Service: Urology;  Laterality: Left;   CYSTOSCOPY WITH STENT PLACEMENT Right 11/20/2012   Procedure: CYSTOSCOPY WITH STENT PLACEMENT;  Surgeon: Irine Seal, MD;  Location: [facility];  Service: Urology;  Laterality: Right;   CYSTOSCOPY WITH URETEROSCOPY, STONE BASKETRY AND STENT PLACEMENT Right 11/20/2012   Procedure: RIGHT URETEROSCOPY,BALLOON DILITATION, STONE EXTRACTION  ;  Surgeon: Irine Seal, MD;  Location: [place];  Service: Urology;  Laterality: Right;   EXTRACORPOREAL SHOCK WAVE LITHOTRIPSY  LEFT 05-31-2011/   RIGHT 10-30-2012   EXTRACORPOREAL SHOCK WAVE LITHOTRIPSY Right 03/14/2017   Procedure: RIGHT EXTRACORPOREAL SHOCK WAVE LITHOTRIPSY (ESWL);  Surgeon: Bjorn Loser, MD;  Location: [facility];  Service: Urology;  Laterality: Right;   HAND SURGERY Right 11/2016    trigger finger   HOLMIUM LASER APPLICATION Right 23/53/6144   Procedure: HOLMIUM LASER APPLICATION;  Surgeon: Irine Seal, MD;  Location: [facility];  Service: Urology;  Laterality: Right;   NASAL SEPTUM SURGERY  DEC 2013   RIGHT WRIST EXPLORATION/ CAPSULOTOMY/ TENOLYSIS  03-30-2002   TENDON REPAIR Left 09/06/2016   Procedure: Tenolysis of left peroneal tendons with repair of peroneus brevus;  Surgeon: [name], MD;  Location: [place];  Service: Orthopedics;  Laterality: Left;   TRANSTHORACIC ECHOCARDIOGRAM  03-27-2012   NORMAL LVF/  EF 55-60%/  NO EVIDENCE MVP   TUBAL LIGATION  1994   VAGINAL HYSTERECTOMY  1999   WRIST GANGLION EXCISION Right 11-14-2001    Prior to Admission medications   Medication Sig Start Date End Date Taking? Authorizing Provider  B Complex Vitamins (VITAMIN B COMPLEX PO) Take 500 mg by mouth.   Yes [provider]  dicyclomine (BENTYL) 20 MG tablet Take 1 tablet (20 mg total) by mouth 3 (three) times daily before meals. 03/14/21  Yes Ladene Artist, MD  diltiazem (DILT-XR) 180 MG 24 hr capsule TAKE 1 CAPSULE (180 MG TOTAL) BY MOUTH EVERY MORNING. 08/18/20  Yes Minus Breeding, MD  estradiol (ESTRACE) 1 MG tablet Take 1 mg by mouth daily. 07/14/19  Yes [provider]  furosemide (LASIX) 20 MG tablet Take 1 tablet (20 mg total) by mouth daily as needed. 08/18/20  Yes Minus Breeding, MD  metoprolol succinate (TOPROL-XL) 25 MG 24 hr tablet Take 1 tablet (25 mg total) by mouth daily. 08/18/20  Yes Minus Breeding, MD  oxybutynin (DITROPAN) 5 MG tablet Take 5 mg by mouth daily.    Yes [provider]  pantoprazole (PROTONIX) 40 MG tablet Take 1 tablet (40 mg total) by mouth 2 (two) times daily. 08/18/20  Yes Minus Breeding, MD  XIIDRA 5 % SOLN  09/21/18  Yes [provider]    Current Outpatient Medications  Medication Sig Dispense Refill   B Complex Vitamins (VITAMIN B COMPLEX PO) Take 500 mg by  mouth.     dicyclomine (BENTYL) 20 MG tablet Take 1 tablet (20 mg total) by mouth 3 (three) times daily before meals. 90 tablet 11   diltiazem (DILT-XR) 180 MG 24 hr capsule TAKE 1 CAPSULE (180 MG TOTAL) BY MOUTH EVERY MORNING. 90 capsule 3   estradiol (ESTRACE) 1 MG tablet Take 1 mg by mouth daily.     furosemide (LASIX) 20 MG tablet Take 1 tablet (20 mg total) by mouth daily as needed. 90 tablet 3   metoprolol succinate (TOPROL-XL) 25 MG 24 hr tablet Take 1 tablet (25 mg total) by mouth daily. 90 tablet 3   oxybutynin (DITROPAN) 5 MG tablet Take 5 mg by mouth daily.      pantoprazole (PROTONIX) 40 MG tablet Take 1 tablet (40 mg total) by mouth 2 (two) times daily. 180 tablet 3   XIIDRA 5 % SOLN      Current Facility-Administered Medications  Medication Dose Route Frequency Provider Last Rate Last Admin   0.9 %  sodium chloride infusion  500 mL Intravenous Continuous Ladene Artist, MD        Allergies as of 07/21/2021 - Review Complete 07/21/2021  Allergen Reaction Noted   Penicillins Hives and Rash 01/19/2009   Vancomycin Swelling 05/19/2011   Iohexol Hives 11/21/2006   Ivp dye [iodinated contrast media] Hives 10/27/2018    Family History  Problem Relation Age of Onset   Hypertension Mother    Diabetes Mother    COPD Mother    Heart disease Mother 65       "aortic replacement" CABG.  Died age 79   Hyperlipidemia Mother    Sudden death Mother    Thyroid disease Mother    Sleep apnea Mother    Obesity Mother    Hypertension Father    Heart failure Father        open heart surgery   Hyperlipidemia Father    Heart disease Father    Cancer Father 54       lung cancer   Gout Brother    Stroke Maternal Grandmother    Colon polyps Neg Hx    Rectal cancer Neg Hx    Stomach cancer Neg Hx    Esophageal cancer Neg Hx     Social History   Socioeconomic History   Marital status: Married    Spouse name: Not on file   Number of children: 2   Years of education: Not on  file   Highest education level: Not on file  Occupational History   Occupation: Architect  Tobacco Use   Smoking status: Never    Passive exposure: Past   Smokeless tobacco: Never  Vaping Use   Vaping Use: Never used  Substance and Sexual Activity   Alcohol use: No   Drug use: No   Sexual activity: Not on file  Other Topics Concern   Not  on file  Social History Narrative   Lives with husband and two children.     Social Determinants of Health   Financial Resource Strain: Not on file  Food Insecurity: Not on file  Transportation Needs: Not on file  Physical Activity: Not on file  Stress: Not on file  Social Connections: Not on file  Intimate Partner Violence: Not on file    Review of Systems:  All systems reviewed were negative except where noted in HPI.   Physical Exam: General:  Alert, well-developed, in NAD Head:  Normocephalic and atraumatic. Eyes:  Sclera clear, no icterus.   Conjunctiva pink. Ears:  Normal auditory acuity. Mouth:  No deformity or lesions.  Neck:  Supple; no masses . Lungs:  Clear throughout to auscultation.   No wheezes, crackles, or rhonchi. No acute distress. Heart:  Regular rate and rhythm; no murmurs. Abdomen:  Soft, nondistended, nontender. No masses, hepatomegaly. No obvious masses.  Normal bowel .    Rectal:  Deferred   Msk:  Symmetrical without gross deformities.. Pulses:  Normal pulses noted. Extremities:  Without edema. Neurologic:  Alert and  oriented x4;  grossly normal neurologically. Skin:  Intact without significant lesions or rashes. Cervical Nodes:  No significant cervical adenopathy. Psych:  Alert and cooperative. Normal mood and affect.   Impression / Plan:   Epigastric pain and GERD for EGD.  Pricilla Riffle. Fuller Plan  07/21/2021, 9:39 AM See Shea Evans, [facility], to contact our on call provider

## 2021-07-21 NOTE — Patient Instructions (Signed)
Await pathology results from the biopsies taken today.  Resume previous diet and medications.    YOU HAD AN ENDOSCOPIC PROCEDURE TODAY AT [facility]:   Refer to the procedure report that was given to you for any specific questions about what was found during the examination.  If the procedure report does not answer your questions, please call your gastroenterologist to clarify.  If you requested that your care partner not be given the details of your procedure findings, then the procedure report has been included in a sealed envelope for you to review at your convenience later.  YOU SHOULD EXPECT: Some feelings of bloating in the abdomen. Passage of more gas than usual.  Walking can help get rid of the air that was put into your GI tract during the procedure and reduce the bloating. If you had a lower endoscopy (such as a colonoscopy or flexible sigmoidoscopy) you may notice spotting of blood in your stool or on the toilet paper. If you underwent a bowel prep for your procedure, you may not have a normal bowel movement for a few days.  Please Note:  You might notice some irritation and congestion in your nose or some drainage.  This is from the oxygen used during your procedure.  There is no need for concern and it should clear up in a day or so.  SYMPTOMS TO REPORT IMMEDIATELY:  Following upper endoscopy (EGD)  Vomiting of blood or coffee ground material  New chest pain or pain under the shoulder blades  Painful or persistently difficult swallowing  New shortness of breath  Fever of 100F or higher  Black, tarry-looking stools  For urgent or emergent issues, a gastroenterologist can be reached at any hour by calling [phone]. Do not use MyChart messaging for urgent concerns.    DIET:  We do recommend a small meal at first, but then you may proceed to your regular diet.  Drink plenty of fluids but you should avoid alcoholic beverages for 24 hours.  ACTIVITY:  You  should plan to take it easy for the rest of today and you should NOT DRIVE or use heavy machinery until tomorrow (because of the sedation medicines used during the test).    FOLLOW UP: Our staff will call the number listed on your records the next business day following your procedure.  We will call around 7:15- 8:00 am to check on you and address any questions or concerns that you may have regarding the information given to you following your procedure. If we do not reach you, we will leave a message.  If you develop any symptoms (ie: fever, flu-like symptoms, shortness of breath, cough etc.) before then, please call [phone].  If you test positive for Covid 19 in the 2 weeks post procedure, please call and report this information to [place].    If any biopsies were taken you will be contacted by phone or by letter within the next 1-3 weeks.  Please call us at [phone] if you have not heard about the biopsies in 3 weeks.    SIGNATURES/CONFIDENTIALITY: You and/or your care partner have signed paperwork which will be entered into your electronic medical record.  These signatures attest to the fact that that the information above on your After Visit Summary has been reviewed and is understood.  Full responsibility of the confidentiality of this discharge information lies with you and/or your care-partner.

## 2021-07-21 NOTE — Op Note (Signed)
[facility] Patient Name: Sarah Wong Procedure Date: 07/21/2021 9:36 AM MRN: [id number] Endoscopist: Ladene Artist , MD Age: 56 Referring MD:  Date of Birth: 11-Feb-1965 Gender: Female Account #: [id number] Procedure:                Upper GI endoscopy Indications:              Epigastric abdominal pain, Gastroesophageal reflux                            disease Medicines:                Monitored Anesthesia Care Procedure:                Pre-Anesthesia Assessment:                           - Prior to the procedure, a History and Physical                            was performed, and patient medications and                            allergies were reviewed. The patient's tolerance of                            previous anesthesia was also reviewed. The risks                            and benefits of the procedure and the sedation                            options and risks were discussed with the patient.                            All questions were answered, and informed consent                            was obtained. Prior Anticoagulants: The patient has                            taken no previous anticoagulant or antiplatelet                            agents. ASA Grade Assessment: II - A patient with                            mild systemic disease. After reviewing the risks                            and benefits, the patient was deemed in                            satisfactory condition to undergo the procedure.  After obtaining informed consent, the endoscope was                            passed under direct vision. Throughout the                            procedure, the patient's blood pressure, pulse, and                            oxygen saturations were monitored continuously. The                            Endoscope was introduced through the mouth, and                            advanced to the second part of duodenum. The  upper                            GI endoscopy was accomplished without difficulty.                            The patient tolerated the procedure well. Scope In: Scope Out: Findings:                 The examined esophagus was normal.                           Patchy mildly erythematous mucosa without bleeding                            was found in the gastric body and in the gastric                            antrum. Biopsies were taken with a cold forceps for                            histology.                           Multiple 4 to 7 mm sessile polyps with no bleeding                            and no stigmata of recent bleeding were found in                            the gastric fundus and in the gastric body.                            Biopsies were taken with a cold forceps for                            histology.                           The  exam of the stomach was otherwise normal.                           The duodenal bulb and second portion of the                            duodenum were normal. Complications:            No immediate complications. Estimated Blood Loss:     Estimated blood loss was minimal. Impression:               - Normal esophagus.                           - Erythematous mucosa in the gastric body and                            antrum. Biopsied.                           - Multiple gastric polyps. Biopsied.                           - Normal duodenal bulb and second portion of the                            duodenum. Recommendation:           - Patient has a contact number available for                            emergencies. The signs and symptoms of potential                            delayed complications were discussed with the                            patient. Return to normal activities tomorrow.                            Written discharge instructions were provided to the                            patient.                           -  Resume previous diet.                           - Continue present medications.                           - Await pathology results. Ladene Artist, MD 07/21/2021 9:55:09 AM This report has been signed electronically.

## 2021-07-21 NOTE — Progress Notes (Signed)
Report to PACU, RN, vss, BBS= Clear.  

## 2021-07-24 ENCOUNTER — Telehealth: Payer: Self-pay | Admitting: *Deleted

## 2021-07-24 NOTE — Telephone Encounter (Signed)
  Follow up Call-     07/21/2021    9:03 AM  Call back number  Post procedure Call Back phone  # [phone]  Permission to leave phone message Yes     Patient questions:  Do you have a fever, pain , or abdominal swelling? No. Pain Score  0 *  Have you tolerated food without any problems? Yes.    Have you been able to return to your normal activities? Yes.    Do you have any questions about your discharge instructions: Diet   No. Medications  No. Follow up visit  No.  Do you have questions or concerns about your Care? No.  Actions: * If pain score is 4 or above: No action needed, pain <4.

## 2021-08-03 ENCOUNTER — Encounter: Payer: Self-pay | Admitting: Gastroenterology

## 2021-08-09 ENCOUNTER — Encounter (INDEPENDENT_AMBULATORY_CARE_PROVIDER_SITE_OTHER): Payer: Self-pay

## 2021-08-17 ENCOUNTER — Telehealth: Payer: Self-pay | Admitting: Gastroenterology

## 2021-08-17 NOTE — Telephone Encounter (Signed)
Inbound call from patient inquiring about recent test results. Patient is also inquiring if she needs to come into the office for a ov. Please give a call to further advise.  Thank you

## 2021-08-17 NOTE — Telephone Encounter (Signed)
Spoke with patient in regards to Dr. Lynne Leader most recent recommendations from procedure on 07/21/21. Pt states she is taking medication as prescribed, and still having ongoing acid reflux & abdominal pain. Follow up scheduled with Anderson Malta, [place] on 09/21/21 at 9:00 am.

## 2021-09-21 ENCOUNTER — Ambulatory Visit: Payer: 59 | Admitting: Physician Assistant

## 2021-10-23 ENCOUNTER — Ambulatory Visit: Payer: 59 | Admitting: Physician Assistant

## 2021-10-29 ENCOUNTER — Other Ambulatory Visit: Payer: Self-pay | Admitting: Cardiology

## 2021-12-08 ENCOUNTER — Encounter: Payer: 59 | Admitting: Family Medicine

## 2021-12-18 ENCOUNTER — Encounter: Payer: Self-pay | Admitting: Family Medicine

## 2021-12-18 ENCOUNTER — Ambulatory Visit (INDEPENDENT_AMBULATORY_CARE_PROVIDER_SITE_OTHER): Payer: 59 | Admitting: Family Medicine

## 2021-12-18 VITALS — BP 118/60 | HR 55 | Temp 97.7°F | Ht 61.42 in | Wt 165.4 lb

## 2021-12-18 DIAGNOSIS — Z0000 Encounter for general adult medical examination without abnormal findings: Secondary | ICD-10-CM | POA: Diagnosis not present

## 2021-12-18 DIAGNOSIS — Z23 Encounter for immunization: Secondary | ICD-10-CM | POA: Diagnosis not present

## 2021-12-18 LAB — LIPID PANEL
Cholesterol: 168 mg/dL (ref 0–200)
HDL: 45.3 mg/dL (ref >39.00)
LDL Cholesterol: 98 mg/dL (ref 0–99)
NonHDL: 122.54
Total CHOL/HDL Ratio: 4
Triglycerides: 122 mg/dL (ref 0.0–149.0)
VLDL: 24.4 mg/dL (ref 0.0–40.0)

## 2021-12-18 LAB — CBC WITH DIFFERENTIAL/PLATELET
Basophils Absolute: 0 10*3/uL (ref 0.0–0.1)
Basophils Relative: 0.3 % (ref 0.0–3.0)
Eosinophils Absolute: 0.1 10*3/uL (ref 0.0–0.7)
Eosinophils Relative: 1.6 % (ref 0.0–5.0)
HCT: 40.1 % (ref 36.0–46.0)
Hemoglobin: 13.8 g/dL (ref 12.0–15.0)
Lymphocytes Relative: 37.8 % (ref 12.0–46.0)
Lymphs Abs: 2.2 10*3/uL (ref 0.7–4.0)
MCHC: 34.3 g/dL (ref 30.0–36.0)
MCV: 94.1 fl (ref 78.0–100.0)
Monocytes Absolute: 0.4 10*3/uL (ref 0.1–1.0)
Monocytes Relative: 6.9 % (ref 3.0–12.0)
Neutro Abs: 3.1 10*3/uL (ref 1.4–7.7)
Neutrophils Relative %: 53.4 % (ref 43.0–77.0)
Platelets: 205 10*3/uL (ref 150.0–400.0)
RBC: 4.26 Mil/uL (ref 3.87–5.11)
RDW: 12.7 % (ref 11.5–15.5)
WBC: 5.9 10*3/uL (ref 4.0–10.5)

## 2021-12-18 LAB — BASIC METABOLIC PANEL
BUN: 10 mg/dL (ref 6–23)
CO2: 27 mEq/L (ref 19–32)
Calcium: 9.2 mg/dL (ref 8.4–10.5)
Chloride: 106 mEq/L (ref 96–112)
Creatinine, Ser: 1.04 mg/dL (ref 0.40–1.20)
GFR: 59.22 mL/min — ABNORMAL LOW (ref >60.00)
Glucose, Bld: 109 mg/dL — ABNORMAL HIGH (ref 70–99)
Potassium: 3.9 mEq/L (ref 3.5–5.1)
Sodium: 142 mEq/L (ref 135–145)

## 2021-12-18 LAB — HEPATIC FUNCTION PANEL
ALT: 30 U/L (ref 0–35)
AST: 33 U/L (ref 0–37)
Albumin: 4.3 g/dL (ref 3.5–5.2)
Alkaline Phosphatase: 51 U/L (ref 39–117)
Bilirubin, Direct: 0.1 mg/dL (ref 0.0–0.3)
Total Bilirubin: 0.8 mg/dL (ref 0.2–1.2)
Total Protein: 7.4 g/dL (ref 6.0–8.3)

## 2021-12-18 LAB — TSH: TSH: 1.42 u[IU]/mL (ref 0.35–5.50)

## 2021-12-18 NOTE — Progress Notes (Signed)
Established Patient Office Visit  Subjective   Patient ID: Sarah Wong, female    DOB: Jan 11, 1965  Age: 56 y.o. MRN: [id number]  Chief Complaint  Patient presents with   Annual Exam    HPI   Sarah Wong is here for physical exam.  She has had multiple orthopedic issues regarding left ankle with multiple prior surgeries.  This limits her exercise somewhat.  She is looking at getting a couple cyst removed from her left wrist soon.  No other active medical problems.  She has a 74 year old brother diagnosed with CAD earlier this year.  [name] had coronary calcium score of 0   5 years ago..  She has history of kidney stones, mitral valve prolapse, and hyperlipidemia  Health maintenance reviewed:  -Needs flu vaccine -Tetanus due 2029 -Previous hysterectomy and no indication for further Pap smears -Mammogram up-to-date -Colonoscopy due 2030 -Shingrix vaccine already given -Prior hepatitis C screen negative  Social history-she is married.  She has 2 daughters.  One is a Chief Executive Officer here in town.  Her daughter who lived in [place] previously had just moved back here.  Her husband is in the [employer] and they are currently and [employer].    She has a few grandchildren.  Patient works for a [employer] loss [employer] in [employer].  Non-smoker.  No regular alcohol.   Family history-her mother had COPD, type 2 diabetes, hypertension, CAD.  Father had lung cancer as well as hypertension and hyperlipidemia.  She has a brother with gout.  Patient's mother also apparently had gout. As above, brother diagnosed with CAD age 98 recently  Past Medical History:  Diagnosis Date   Edema, lower extremity    Fundic gland polyps of stomach, benign    GERD (gastroesophageal reflux disease)    History of kidney stones    HTN (hypertension)    Hyperlipidemia    history of, no medications now   Mild obstructive sleep apnea    PER STUDY 07-07-2010--  MILD OSA/  NO CPAP RX     MVP (mitral valve prolapse)    Palpitations     Renal calculus, bilateral    NON-OBSTRUTIVE   Right ureteral stone    Sleep apnea    Splenic flexure syndrome    Past Surgical History:  Procedure Laterality Date   ANKLE SURGERY Left    CARDIOVASCULAR STRESS TEST  11-08-2009  DR [facility]   NORMAL PERFUSION STUDY/ NO ISCHEMIA/ EF 63%   CYSTOSCOPY W/ URETERAL STENT PLACEMENT  05/21/2011   Procedure: CYSTOSCOPY WITH RETROGRADE PYELOGRAM/URETERAL STENT PLACEMENT;  Surgeon: Malka So, MD;  Location: [facility];  Service: Urology;  Laterality: Left;   CYSTOSCOPY WITH STENT PLACEMENT Right 11/20/2012   Procedure: CYSTOSCOPY WITH STENT PLACEMENT;  Surgeon: Irine Seal, MD;  Location: [facility];  Service: Urology;  Laterality: Right;   CYSTOSCOPY WITH URETEROSCOPY, STONE BASKETRY AND STENT PLACEMENT Right 11/20/2012   Procedure: RIGHT URETEROSCOPY,BALLOON DILITATION, STONE EXTRACTION  ;  Surgeon: Irine Seal, MD;  Location: [place];  Service: Urology;  Laterality: Right;   EXTRACORPOREAL SHOCK WAVE LITHOTRIPSY  LEFT 05-31-2011/   RIGHT 10-30-2012   EXTRACORPOREAL SHOCK WAVE LITHOTRIPSY Right 03/14/2017   Procedure: RIGHT EXTRACORPOREAL SHOCK WAVE LITHOTRIPSY (ESWL);  Surgeon: Bjorn Loser, MD;  Location: [facility];  Service: Urology;  Laterality: Right;   HAND SURGERY Right 11/2016   trigger finger   HOLMIUM LASER APPLICATION Right 92/42/6834   Procedure: HOLMIUM LASER APPLICATION;  Surgeon: Irine Seal, MD;  Location: [place];  Service: Urology;  Laterality: Right;   NASAL SEPTUM SURGERY  DEC 2013   RIGHT WRIST EXPLORATION/ CAPSULOTOMY/ TENOLYSIS  03-30-2002   TENDON REPAIR Left 09/06/2016   Procedure: Tenolysis of left peroneal tendons with repair of peroneus brevus;  Surgeon: [name], MD;  Location: [place];  Service: Orthopedics;  Laterality: Left;   TRANSTHORACIC ECHOCARDIOGRAM  03-27-2012   NORMAL LVF/  EF 55-60%/  NO EVIDENCE MVP   TUBAL LIGATION  1994   VAGINAL  HYSTERECTOMY  1999   WRIST GANGLION EXCISION Right 11-14-2001    reports that she has never smoked. She has been exposed to tobacco smoke. She has never used smokeless tobacco. She reports that she does not drink alcohol and does not use drugs. family history includes COPD in her mother; Cancer (age of onset: 56) in her father; Diabetes in her mother; Gout in her brother; Heart disease in her father; Heart disease (age of onset: 2) in her mother; Heart failure in her father; Hyperlipidemia in her father and mother; Hypertension in her father and mother; Obesity in her mother; Sleep apnea in her mother; Stroke in her maternal grandmother; Sudden death in her mother; Thyroid disease in her mother. Allergies  Allergen Reactions   Penicillins Hives and Rash   Vancomycin Swelling    Other reaction(s): Redness   Iohexol Hives     Code: HIVES, Desc: HIVES WITH IV CONTRAST MEDIA- PT REQUIRES 13 HR PRE-MEDS, Onset Date: [id number]    Ivp Dye [Iodinated Contrast Media] Hives    ROS    Objective:     BP 118/60 (BP Location: Left Arm, Patient Position: Sitting, Cuff Size: Normal)   Pulse (!) 55   Temp 97.7 F (36.5 C) (Oral)   Ht 5' 1.42" (1.56 m)   Wt 165 lb 6.4 oz (75 kg)   SpO2 96%   BMI 30.83 kg/m  BP Readings from Last 3 Encounters:  12/18/21 118/60  07/21/21 (!) 108/58  07/11/21 (!) 146/80   Wt Readings from Last 3 Encounters:  12/18/21 165 lb 6.4 oz (75 kg)  07/21/21 168 lb (76.2 kg)  07/11/21 170 lb 8 oz (77.3 kg)      Physical Exam   No results found for any visits on 12/18/21.    The 10-year ASCVD risk score (Arnett DK, et al., 2019) is: 3%    Assessment & Plan:   Problem List Items Addressed This Visit   None Visit Diagnoses     Physical exam    -  Primary   Relevant Orders   Basic metabolic panel   Lipid panel   CBC with Differential/Platelet   TSH   Hepatic function panel     Several health maintenance items discussed  -We discussed trying to  establish more consistent exercise.  Her ankle is limited some of her walking and weightbearing.  She will check with orthopedist regarding their recommendations for allowable exercise based on her ankle situation  -Flu vaccine given  -Continue annual mammogram  -Screening labs as above  No follow-ups on file.    Carolann Littler, MD

## 2021-12-18 NOTE — Patient Instructions (Signed)
Check with insurance if interested in pursuing Ozempic or [place].

## 2021-12-18 NOTE — Addendum Note (Signed)
Addended by: Nilda Riggs on: 12/18/2021 10:22 AM   Modules accepted: Orders

## 2021-12-27 ENCOUNTER — Other Ambulatory Visit: Payer: Self-pay

## 2021-12-30 ENCOUNTER — Other Ambulatory Visit: Payer: Self-pay | Admitting: Cardiology

## 2022-01-31 ENCOUNTER — Other Ambulatory Visit: Payer: Self-pay | Admitting: Cardiology

## 2022-02-26 ENCOUNTER — Other Ambulatory Visit: Payer: Self-pay | Admitting: Cardiology

## 2022-03-23 ENCOUNTER — Other Ambulatory Visit: Payer: Self-pay | Admitting: Gastroenterology

## 2022-03-29 ENCOUNTER — Other Ambulatory Visit: Payer: Self-pay | Admitting: Cardiology

## 2022-04-06 ENCOUNTER — Other Ambulatory Visit: Payer: Self-pay | Admitting: Cardiology

## 2022-04-30 ENCOUNTER — Other Ambulatory Visit: Payer: Self-pay | Admitting: Cardiology

## 2022-05-31 LAB — HM MAMMOGRAPHY

## 2022-06-05 ENCOUNTER — Other Ambulatory Visit: Payer: Self-pay | Admitting: Cardiology

## 2022-06-06 ENCOUNTER — Other Ambulatory Visit: Payer: Self-pay | Admitting: Cardiology

## 2022-07-12 ENCOUNTER — Encounter (INDEPENDENT_AMBULATORY_CARE_PROVIDER_SITE_OTHER): Payer: Self-pay | Admitting: Otolaryngology

## 2022-07-12 ENCOUNTER — Institutional Professional Consult (permissible substitution) (INDEPENDENT_AMBULATORY_CARE_PROVIDER_SITE_OTHER): Payer: 59 | Admitting: Otolaryngology

## 2022-07-12 VITALS — BP 118/73 | HR 51

## 2022-07-12 DIAGNOSIS — J3489 Other specified disorders of nose and nasal sinuses: Secondary | ICD-10-CM

## 2022-07-12 DIAGNOSIS — G44229 Chronic tension-type headache, not intractable: Secondary | ICD-10-CM

## 2022-07-12 DIAGNOSIS — R0981 Nasal congestion: Secondary | ICD-10-CM

## 2022-07-12 DIAGNOSIS — J3089 Other allergic rhinitis: Secondary | ICD-10-CM

## 2022-07-12 DIAGNOSIS — J343 Hypertrophy of nasal turbinates: Secondary | ICD-10-CM

## 2022-07-12 DIAGNOSIS — J329 Chronic sinusitis, unspecified: Secondary | ICD-10-CM

## 2022-07-12 DIAGNOSIS — J342 Deviated nasal septum: Secondary | ICD-10-CM

## 2022-07-12 DIAGNOSIS — M26623 Arthralgia of bilateral temporomandibular joint: Secondary | ICD-10-CM

## 2022-07-12 MED ORDER — FLUTICASONE PROPIONATE 50 MCG/ACT NA SUSP: 2.0000 | Freq: Every day | NASAL | 6 refills | Status: DC

## 2022-07-12 MED ORDER — CETIRIZINE HCL 10 MG PO TABS: 10.0000 mg | ORAL_TABLET | Freq: Every day | ORAL | 11 refills | Status: DC

## 2022-07-12 NOTE — Patient Instructions (Signed)
-   use Motrin as needed for TMJ (jaw joint pain) - discuss with PCP about alternatives for reflux medication to avoid long-term side effects  - start Flonase and Zyrtec for nasal congestion and use it every day - use nasal saline spray several times a day and Vaseline to prevent nasal dryness and nose bleeds - schedule CT of your sinuses  - return in 3 months

## 2022-07-12 NOTE — Progress Notes (Signed)
ENT CONSULT:  Reason for Consult: recurrent ear discomfort and facial pain/pressure    Referring Physician:  self-referred   HPI: Sarah Wong is an 57 y.o. female is here for recurrent ear aches x 20 yrs, come and go with exacerbation of about once a month , dull achy, associated with facial pain/pressure and headaches. She reports last time it happened 6 months ago, and she received a prescription for oral antibiotic. She has GERD and on PPI.  No sinus surgeries or ear surgeries in the past.  Not on nasal spray or antihistamine right now, thinks she has nasal congestion. She has always lived here in [place]. Denies decreased sense of smell, denies itchy or watery eyes. No prior sinus scans.  Hx of septoplasty when she was in her 50's.  Hx of UPPP for mild OSA per report    Records Reviewed:  Hx of mitral valve prolapse  Had neck lump 2019, and U/S + CT neck results were c/w non-pathologic lymph node     Past Medical History:  Diagnosis Date   Edema, lower extremity    Fundic gland polyps of stomach, benign    GERD (gastroesophageal reflux disease)    History of kidney stones    HTN (hypertension)    Hyperlipidemia    history of, no medications now   Mild obstructive sleep apnea    PER STUDY 07-07-2010--  MILD OSA/  NO CPAP RX     MVP (mitral valve prolapse)    Palpitations    Renal calculus, bilateral    NON-OBSTRUTIVE   Right ureteral stone    Sleep apnea    Splenic flexure syndrome     Past Surgical History:  Procedure Laterality Date   ANKLE SURGERY Left    CARDIOVASCULAR STRESS TEST  11-08-2009  DR [facility]   NORMAL PERFUSION STUDY/ NO ISCHEMIA/ EF 63%   CYSTOSCOPY W/ URETERAL STENT PLACEMENT  05/21/2011   Procedure: CYSTOSCOPY WITH RETROGRADE PYELOGRAM/URETERAL STENT PLACEMENT;  Surgeon: Anner Crete, MD;  [facility];  Service: Urology;  Laterality: Left;   CYSTOSCOPY WITH STENT PLACEMENT Right 11/20/2012   Procedure: CYSTOSCOPY WITH STENT PLACEMENT;   Surgeon: Bjorn Pippin, MD;  Location: [facility];  Service: Urology;  Laterality: Right;   CYSTOSCOPY WITH URETEROSCOPY, STONE BASKETRY AND STENT PLACEMENT Right 11/20/2012   Procedure: RIGHT URETEROSCOPY,BALLOON DILITATION, STONE EXTRACTION  ;  Surgeon: Bjorn Pippin, MD;  Location: [facility];  Service: Urology;  Laterality: Right;   EXTRACORPOREAL SHOCK WAVE LITHOTRIPSY  LEFT 05-31-2011/   RIGHT 10-30-2012   EXTRACORPOREAL SHOCK WAVE LITHOTRIPSY Right 03/14/2017   Procedure: RIGHT EXTRACORPOREAL SHOCK WAVE LITHOTRIPSY (ESWL);  Surgeon: Alfredo Martinez, MD;  [facility];  Service: Urology;  Laterality: Right;   HAND SURGERY Right 11/2016   trigger finger   HOLMIUM LASER APPLICATION Right 11/20/2012   Procedure: HOLMIUM LASER APPLICATION;  Surgeon: Bjorn Pippin, MD;  Location: [facility];  Service: Urology;  Laterality: Right;   NASAL SEPTUM SURGERY  DEC 2013   RIGHT WRIST EXPLORATION/ CAPSULOTOMY/ TENOLYSIS  03-30-2002   TENDON REPAIR Left 09/06/2016   Procedure: Tenolysis of left peroneal tendons with repair of peroneus brevus;  Surgeon: Toni Arthurs, MD;  Location: [facility];  Service: Orthopedics;  Laterality: Left;   TRANSTHORACIC ECHOCARDIOGRAM  03-27-2012   NORMAL LVF/  EF 55-60%/  NO EVIDENCE MVP   TUBAL LIGATION  1994   VAGINAL HYSTERECTOMY  1999   WRIST GANGLION EXCISION Right 11-14-2001  Family History  Problem Relation Age of Onset   Hypertension Mother    Diabetes Mother    COPD Mother    Heart disease Mother 59       "aortic replacement" CABG.  Died age 64   Hyperlipidemia Mother    Sudden death Mother    Thyroid disease Mother    Sleep apnea Mother    Obesity Mother    Hypertension Father    Heart failure Father        open heart surgery   Hyperlipidemia Father    Heart disease Father    Cancer Father 3       lung cancer   Gout Brother    Stroke Maternal Grandmother    Colon polyps Neg Hx     Rectal cancer Neg Hx    Stomach cancer Neg Hx    Esophageal cancer Neg Hx     Social History:  reports that she has never smoked. She has been exposed to tobacco smoke. She has never used smokeless tobacco. She reports that she does not drink alcohol and does not use drugs.  Allergies:  Allergies  Allergen Reactions   Penicillins Hives and Rash   Vancomycin Swelling    Other reaction(s): Redness   Iohexol Hives     Code: HIVES, Desc: HIVES WITH IV CONTRAST MEDIA- PT REQUIRES 13 HR PRE-MEDS, Onset Date: [id number]    Ivp Dye [Iodinated Contrast Media] Hives    Medications: I have reviewed the patient's current medications.  No results found for this or any previous visit (from the past 48 hour(s)).  No results found.  The PMH, PSH, Medications, Allergies, and SH were reviewed and updated.  ROS: Constitutional: Negative for fever, weight loss and weight gain. Cardiovascular: Negative for chest pain and dyspnea on exertion. Respiratory: Is not experiencing shortness of breath at rest. Gastrointestinal: Negative for nausea and vomiting. Neurological: Negative for headaches. Psychiatric: The patient is not nervous/anxiou  Blood pressure 118/73, pulse (!) 51, SpO2 97%.  PHYSICAL EXAM:  Exam: General: Well-developed, well-nourished Communication and Voice: Clear pitch and clarity Respiratory Respiratory effort: Equal inspiration and expiration without stridor Cardiovascular Peripheral Vascular: Warm extremities with equal color/perfusion Eyes: No nystagmus with equal extraocular motion bilaterally Neuro/Psych/Balance: Patient oriented to person, place, and time; Appropriate mood and affect; Gait is intact with no imbalance; Cranial nerves I-XII are intact Head and Face Inspection: Normocephalic and atraumatic without mass or lesion Palpation: Facial skeleton intact without bony stepoffs Salivary Glands: No mass or tenderness Facial Strength: Facial motility symmetric and  full bilaterally ENT Pinna: External ear intact and fully developed External canal: Canal is patent with intact skin Tympanic Membrane: Clear and mobile External Nose: No scar or anatomic deformity Internal Nose: Septum intact and midline. No edema, polyp, or rhinorrhea Lips, Teeth, and gums: Mucosa and teeth intact and viable TMJ: No pain to palpation with full mobility Oral cavity/oropharynx: No erythema or exudate, no lesions present Nasopharynx: No mass or lesion with intact mucosa Hypopharynx: Intact mucosa without pooling of secretions Larynx Glottic: Full true vocal cord mobility without lesion or mass Supraglottic: Normal appearing epiglottis and AE folds Interarytenoid Space: No or minimal pachydermia or edema Subglottic Space: Patent without lesion or edema Neck Neck and Trachea: Midline trachea without mass or lesion Thyroid: No mass or nodularity Lymphatics: No lymphadenopathy  Procedure:   PROCEDURE NOTE: nasal endoscopy  Preoperative diagnosis: chronic sinusitis symptoms  Postoperative diagnosis: same  Procedure: Diagnostic nasal endoscopy (54098)  Surgeon: Ashok Croon,  M.D.  Anesthesia: Topical lidocaine and Afrin  H&P REVIEW: The patient's history and physical were reviewed today prior to procedure. All medications were reviewed and updated as well. Complications: None Condition is stable throughout exam Indications and consent: The patient presents with symptoms of chronic sinusitis not responding to previous therapies. All the risks, benefits, and potential complications were reviewed with the patient preoperatively and informed consent was obtained. The time out was completed with confirmation of the correct procedure.   Procedure: The patient was seated upright in the clinic. Topical lidocaine and Afrin were applied to the nasal cavity. After adequate anesthesia had occurred, the rigid nasal endoscope was passed into the nasal cavity. The nasal mucosa,  turbinates, septum, and sinus drainage pathways were visualized bilaterally. This revealed no purulence or significant secretions that might be cultured. There were no polyps or sites of significant inflammation. The mucosa was intact and there no crusting present. The scope was then slowly withdrawn and the patient tolerated the procedure well. There were no complications or blood loss.       Studies Reviewed:  CT neck w/o contrast 09/19/2017 -I personally reviewed the scan and there is no evidence of chronic sinus inflammation on the scan back in 2019 Radiology Report  CLINICAL DATA:  Lump in the left posterior neck. Indeterminate findings at ultrasound.   EXAM: CT NECK WITHOUT CONTRAST   TECHNIQUE: Multidetector CT imaging of the neck was performed following the standard protocol without intravenous contrast.   COMPARISON:  Ultrasound 08/20/2017.  CT 05/02/2016.   FINDINGS: Pharynx and larynx: No mucosal or submucosal lesion.   Salivary glands: Parotid and submandibular glands are normal.   Thyroid: Normal   Lymph nodes: No worrisome lymph nodes. The lymph node visible at sonography in the left posterior neck is demonstrated measuring 9 x 6 mm, with a prominent fatty hilum, explaining the ultrasound appearance. There is no evidence of pathologic node or other soft tissue mass in the region. The appearance is unchanged since the prior CT.   Vascular: No abnormal vascular finding.   Limited intracranial: Normal   Visualized orbits: Normal   Mastoids and visualized paranasal sinuses: Clear   Skeleton: Ordinary mild cervical spondylosis.   Upper chest: Normal   Other: None   IMPRESSION: The ultrasound finding appears to correlate with a largely fatty replaced lymph node in the left posterior neck, which is unchanged when compared to the ultrasound and a CT scan of 05/02/2016. There is no worrisome finding at CT. No evidence of worrisome lymph node or of any soft  tissue mass.  Assessment/Plan: Encounter Diagnoses  Name Primary?   Nasal congestion Yes   Chronic tension-type headache, not intractable    Nasal obstruction    Environmental and seasonal allergies    Bilateral temporomandibular joint pain    Chronic sinusitis, unspecified location    Deviated nasal septum    Hypertrophy of both inferior nasal turbinates    57 year old female with history of chronic recurrent symptoms of ear discomfort and facial pain and pressure she also has history of chronic headaches that come and go is here for initial evaluation with me she was previously followed by [facility] who retired since their last visit a few days few years ago she never had imaging to evaluate for chronic sinus inflammation such as sinus CT and denies having sinus surgery although she did have septoplasty when she was 18 and also had a UPPP for mild sleep apnea.  She takes PPI for  GERD and when she stops the medication she reports acute reflux symptoms she is not on nasal sprays or antihistamines right now but feels that she does have nasal congestion at baseline in the past for similar pain she would get a prescription for oral antibiotic and it would typically help her she is here for evaluation due to recurrent episodes that typically happen at least once a month had 3 courses of oral antibiotic in the past 12 months. Her exam including nasal endoscopy revealed no signs of bacterial sinus infection, but we will obtain CT sinuses to rule that out. She had bilateral TMJ tenderness and her ear sx could be related to that, since her ear exam today was unremarkable.  If sinus CT is negative I suspect that she has underlying seasonal environmental allergies and we will treat her empirically with Zyrtec and Flonase all we wait for scan results.  If she continues to have headaches we will make a referral for her to see neurology in the future.  She will return in 3 months for symptom check and to review  her scans.    - use Motrin as needed for TMJ (jaw joint pain) - discuss with PCP about alternatives for reflux medication to avoid long-term side effects  - start Flonase and Zyrtec for nasal congestion and use it every day - use nasal saline spray several times a day and Vaseline to prevent nasal dryness and nose bleeds - schedule CT of your sinuses  - return in 3 months   Thank you for allowing me to participate in the care of this patient. Please do not hesitate to contact me with any questions or concerns.   Ashok Croon, MD Otolaryngology [facility] Phone: [phone] Fax: [phone]    07/12/2022, 11:15 AM

## 2022-08-02 ENCOUNTER — Other Ambulatory Visit: Payer: Self-pay | Admitting: Cardiology

## 2022-08-02 NOTE — Progress Notes (Signed)
Cardiology Office Note:   Date:  08/03/2022  ID:  Sarah Wong, DOB 25-Aug-1965, MRN [id number] PCP: Sarah Covey, MD  [facility] Providers Cardiologist:  None {  History of Present Illness:   Sarah Wong is a 57 y.o. female  the patient presents for evaluation having previously been seen by Dr. Alanda Wong.  He followed her for many years for her mitral valve prolapse. Echo from 2014 demonstrated no evidence of prolapse and no regurgitation. He has also followed her because of a family history of early onset heart disease.   A coronary calcium score from 2018 demonstrated no evidence of calcium.   She had a history of MVP but echo 2021 did not show this and it was essentially normal.    He comes today because she had some episode of chest discomfort last week.  She went out for lunch.  She developed some substernal discomfort mid chest radiating to the left shoulder.  She went home and she really has not felt well since then.  She has not had recurrent pain but she has had decreased exercise tolerance.  This was moderate discomfort.  She could not quantify or qualify.  She felt a little nauseated.  There was no vomiting.  She has not had any new shortness of breath, PND or orthopnea.  She has not had any palpitations, presyncope or syncope.  She has not had pain like this before.  She is unable to exercise because of ankle problems and might need surgery soon.  ROS: Foot pain/ankle pain.  Otherwise as stated in the HPI and negative for all other systems.  Studies Reviewed:    EKG:   EKG Interpretation Date/Time:  Friday August 03 2022 09:08:49 EDT Ventricular Rate:  55 PR Interval:  138 QRS Duration:  86 QT Interval:  432 QTC Calculation: 413 R Axis:   -28  Text Interpretation: Sinus bradycardia Nonspecific T wave abnormality Leftward axis No previous ECGs available Confirmed by Sarah Wong ([id number]) on 08/03/2022 9:33:28 AM    Risk Assessment/Calculations:               Physical Exam:   VS:  BP 122/74 (BP Location: Left Arm, Patient Position: Sitting, Cuff Size: Normal)   Pulse (!) 55   Ht 5' (1.524 m)   Wt 166 lb (75.3 kg)   SpO2 98%   BMI 32.42 kg/m    Wt Readings from Last 3 Encounters:  08/03/22 166 lb (75.3 kg)  12/18/21 165 lb 6.4 oz (75 kg)  07/21/21 168 lb (76.2 kg)     GEN: Well nourished, well developed in no acute distress NECK: No JVD; No carotid bruits CARDIAC: RRR, no murmurs, rubs, gallops RESPIRATORY:  Clear to auscultation without rales, wheezing or rhonchi  ABDOMEN: Soft, non-tender, non-distended EXTREMITIES:  No edema; No deformity   ASSESSMENT AND PLAN:   BRADYCARDIA:    Patient is well with this.  No change in therapy.  CHEST PAIN: She has a strong family history.  Her brother just had cardiac arrest and 2 stents placed.  She has chest discomfort and other risk factors.  She will be able to walk on a treadmill.  The pretest probability of obstructive coronary disease is at least moderately high.  I will order a coronary CTA.    DYSLIPIDEMIA:     LDL was most recently 98.  She is currently off statin.  Goals of therapy will be based on plaque burden.  MVP:  There was no evidence of this on echo in 2021.  No further imaging.  OBESITY: She asked about weight loss drugs.  We talked about lifestyle changes and diet with activity first and then this could be considered.       Follow up with me based on the results of the above  Signed, Sarah Rotunda, MD

## 2022-08-03 ENCOUNTER — Encounter: Payer: Self-pay | Admitting: Cardiology

## 2022-08-03 ENCOUNTER — Ambulatory Visit: Payer: 59 | Attending: Cardiology | Admitting: Cardiology

## 2022-08-03 VITALS — BP 122/74 | HR 55 | Ht 60.0 in | Wt 166.0 lb

## 2022-08-03 DIAGNOSIS — R072 Precordial pain: Secondary | ICD-10-CM | POA: Diagnosis not present

## 2022-08-03 DIAGNOSIS — E785 Hyperlipidemia, unspecified: Secondary | ICD-10-CM | POA: Diagnosis not present

## 2022-08-03 DIAGNOSIS — R001 Bradycardia, unspecified: Secondary | ICD-10-CM

## 2022-08-03 MED ORDER — PREDNISONE 50 MG PO TABS: ORAL_TABLET | ORAL | 0 refills | Status: DC

## 2022-08-03 MED ORDER — FAMOTIDINE 20 MG PO TABS: ORAL_TABLET | ORAL | 0 refills | Status: DC

## 2022-08-03 MED ORDER — METOPROLOL TARTRATE 25 MG PO TABS: ORAL_TABLET | ORAL | 0 refills | Status: DC

## 2022-08-03 NOTE — Patient Instructions (Signed)
Testing/Procedures:     Your cardiac CT will be scheduled at   [facility] [address] [place], [place] [address] [phone]   If scheduled at [facility], please arrive at the [facility] (Entrance C2) of [facility] 30 minutes prior to test start time. You can use the FREE valet parking offered at entrance C (encouraged to control the heart rate for the test)  Proceed to the [facility] Radiology Department (first floor) to check-in and test prep.  All radiology patients and guests should use entrance C2 at [facility], accessed from [facility], even though the hospital's physical address listed is [address]       Please follow these instructions carefully (unless otherwise directed):  An IV will be required for this test and Nitroglycerin will be given.    On the Night Before the Test: Be sure to Drink plenty of water. Do not consume any caffeinated/decaffeinated beverages or chocolate 12 hours prior to your test. Do not take any antihistamines 12 hours prior to your test.                     TAKE PREDNISONE 50 MG 13 HOURS PRIOR TO CT                   TAKE PREDNISONE 50 MG 7 HOURS PRIOR TO CT          TAKE PREDNISONE 50 MG 1 HOUR PRIOR TO CT          TAKE PEPCID 20 MG 1 HOUR PRIOR AND TAKE      BENADRYL 25 MG 1 HOUR PRIOR     On the Day of the Test: Drink plenty of water until 1 hour prior to the test. Do not eat any food 1 hour prior to test. You may take your regular medications prior to the test.  Take metoprolol 25 MG (Lopressor) two hours prior to test. If you take Furosemide/Hydrochlorothiazide/Spironolactone, please HOLD on the morning of the test. FEMALES- please wear underwire-free bra if available, avoid dresses & tight clothing      After the Test: Drink plenty of water. After receiving IV contrast, you may experience a mild flushed feeling. This is  normal. On occasion, you may experience a mild rash up to 24 hours after the test. This is not dangerous. If this occurs, you can take Benadryl 25 mg and increase your fluid intake. If you experience trouble breathing, this can be serious. If it is severe call 911 IMMEDIATELY. If it is mild, please call our office. If you take any of these medications: Glipizide/Metformin, Avandament, Glucavance, please do not take 48 hours after completing test unless otherwise instructed.  We will call to schedule your test 2-4 weeks out understanding that some insurance companies will need an authorization prior to the service being performed.   For more information and frequently asked questions, please visit our website : [email or web address]  For non-scheduling related questions, please contact the cardiac imaging nurse navigator should you have any questions/concerns: Cardiac Imaging Nurse Navigators Direct Office Dial: [phone]   For scheduling needs, including cancellations and rescheduling, please call [place], [phone].    Follow-Up: At [facility], you and your health needs are our priority.  As part of our continuing mission to provide you with exceptional heart care, we have created designated Provider Care Teams.  These Care Teams include your primary Cardiologist (  physician) and Advanced Practice Providers (APPs -  Physician Assistants and Nurse Practitioners) who all work together to provide you with the care you need, when you need it.  We recommend signing up for the patient portal called "MyChart".  Sign up information is provided on this After Visit Summary.  MyChart is used to connect with patients for Virtual Visits (Telemedicine).  Patients are able to view lab/test results, encounter notes, upcoming appointments, etc.  Non-urgent messages can be sent to your provider as well.   To learn more about what you can do with MyChart, go to [email or web address].     Your next appointment:   As needed

## 2022-08-09 ENCOUNTER — Ambulatory Visit (HOSPITAL_COMMUNITY)
Admission: RE | Admit: 2022-08-09 | Discharge: 2022-08-09 | Disposition: A | Discharge status: Home / Self Care | Payer: 59 | Source: Ambulatory Visit | Attending: Cardiology | Admitting: Cardiology

## 2022-08-09 DIAGNOSIS — R072 Precordial pain: Secondary | ICD-10-CM | POA: Diagnosis present

## 2022-08-09 MED ORDER — IOHEXOL 350 MG/ML SOLN
95.0000 mL | Freq: Once | INTRAVENOUS | Status: AC | PRN
  Administered 2022-08-09: 95 mL via INTRAVENOUS

## 2022-08-09 MED ORDER — METOPROLOL TARTRATE 5 MG/5ML IV SOLN
INTRAVENOUS | Status: AC
  Filled 2022-08-09: qty 10

## 2022-08-09 MED ORDER — METOPROLOL TARTRATE 5 MG/5ML IV SOLN
INTRAVENOUS | Status: AC
  Filled 2022-08-09: qty 5

## 2022-08-09 MED ORDER — NITROGLYCERIN 0.4 MG SL SUBL
SUBLINGUAL_TABLET | SUBLINGUAL | Status: AC
  Filled 2022-08-09: qty 2

## 2022-08-09 MED ORDER — NITROGLYCERIN 0.4 MG SL SUBL
0.8000 mg | SUBLINGUAL_TABLET | Freq: Once | SUBLINGUAL | Status: AC
  Administered 2022-08-09: 0.8 mg via SUBLINGUAL

## 2022-08-10 ENCOUNTER — Encounter: Payer: Self-pay | Admitting: Cardiology

## 2022-08-10 ENCOUNTER — Telehealth: Payer: Self-pay

## 2022-08-10 NOTE — Telephone Encounter (Signed)
Authorization Number: [id number] Case Number: [id number] Review Date: 08/10/2022 4:19:05 PM Expiration Date: 09/24/2022

## 2022-08-13 ENCOUNTER — Other Ambulatory Visit: Payer: Self-pay | Admitting: *Deleted

## 2022-08-13 ENCOUNTER — Telehealth: Payer: Self-pay | Admitting: *Deleted

## 2022-08-13 DIAGNOSIS — E785 Hyperlipidemia, unspecified: Secondary | ICD-10-CM

## 2022-08-13 MED ORDER — ROSUVASTATIN CALCIUM 20 MG PO TABS
20.0000 mg | ORAL_TABLET | Freq: Every day | ORAL | 3 refills | Status: AC
Start: 2022-08-13 — End: ?

## 2022-08-13 NOTE — Telephone Encounter (Signed)
-----   Message from Rollene Rotunda sent at 08/12/2022  4:13 PM EDT ----- She had no calcium but did have mild non calcified plaque including LAD 20 - 49%.  I would like to see her back in about 3 months.  Prior to that I would suggest that she try to get her LDL down to 50s.  I would suggest starting Crestor 20 mg po daily and repeat the lipids before she sees me.  Also draw an LPa when she comes back for labs.

## 2022-08-13 NOTE — Telephone Encounter (Signed)
Pt has reviewed results via my chart  New script sent to the pharmacy  Lab orders mailed to the pt  Left message for pt to call to schedule appointment in 3 months

## 2022-08-30 ENCOUNTER — Ambulatory Visit (HOSPITAL_BASED_OUTPATIENT_CLINIC_OR_DEPARTMENT_OTHER)
Admission: RE | Admit: 2022-08-30 | Discharge: 2022-08-30 | Disposition: A | Discharge status: Home / Self Care | Payer: 59 | Source: Ambulatory Visit | Attending: Otolaryngology | Admitting: Otolaryngology

## 2022-08-30 DIAGNOSIS — R0981 Nasal congestion: Secondary | ICD-10-CM | POA: Insufficient documentation

## 2022-09-14 ENCOUNTER — Ambulatory Visit: Payer: 59

## 2022-09-21 ENCOUNTER — Ambulatory Visit: Payer: 59

## 2022-09-25 LAB — LIPID PANEL
Chol/HDL Ratio: 2.4 ratio (ref 0.0–4.4)
Cholesterol, Total: 152 mg/dL (ref 100–199)
HDL: 64 mg/dL (ref >39)
LDL Chol Calc (NIH): 67 mg/dL (ref 0–99)
Triglycerides: 119 mg/dL (ref 0–149)
VLDL Cholesterol Cal: 21 mg/dL (ref 5–40)

## 2022-09-27 ENCOUNTER — Encounter: Payer: Self-pay | Admitting: Cardiology

## 2022-10-01 ENCOUNTER — Telehealth: Payer: Self-pay | Admitting: Cardiology

## 2022-10-01 DIAGNOSIS — E785 Hyperlipidemia, unspecified: Secondary | ICD-10-CM

## 2022-10-01 MED ORDER — ROSUVASTATIN CALCIUM 40 MG PO TABS
40.0000 mg | ORAL_TABLET | Freq: Every day | ORAL | 3 refills | Status: DC
Start: 2022-10-01 — End: 2023-10-28

## 2022-10-01 NOTE — Telephone Encounter (Signed)
Patient states she is returning call. Please advise  

## 2022-10-01 NOTE — Telephone Encounter (Signed)
Spoke with patient and she is aware of lab results and provider recommendations. She verbalized understanding.Marland Kitchen Rx for crerstor 40 mg sent to pharmacy. Labs ordered

## 2022-10-02 ENCOUNTER — Telehealth: Payer: Self-pay | Admitting: Cardiology

## 2022-10-02 NOTE — Telephone Encounter (Signed)
Pt states she is returning a call from [place], [place]

## 2022-10-02 NOTE — Telephone Encounter (Signed)
Called pt, went over lab results and informed her the medication was sent into her pharmacy yesterday. Pt verbalized understanding. No further questions at this time.

## 2022-10-03 ENCOUNTER — Other Ambulatory Visit: Payer: Self-pay | Admitting: *Deleted

## 2022-10-03 DIAGNOSIS — E785 Hyperlipidemia, unspecified: Secondary | ICD-10-CM

## 2022-10-07 ENCOUNTER — Other Ambulatory Visit: Payer: Self-pay | Admitting: Cardiology

## 2022-10-12 ENCOUNTER — Ambulatory Visit (INDEPENDENT_AMBULATORY_CARE_PROVIDER_SITE_OTHER): Payer: 59 | Admitting: Otolaryngology

## 2022-10-15 ENCOUNTER — Ambulatory Visit (INDEPENDENT_AMBULATORY_CARE_PROVIDER_SITE_OTHER): Payer: 59 | Admitting: Otolaryngology

## 2022-10-15 ENCOUNTER — Encounter (INDEPENDENT_AMBULATORY_CARE_PROVIDER_SITE_OTHER): Payer: Self-pay | Admitting: Otolaryngology

## 2022-10-15 VITALS — BP 124/75 | HR 60

## 2022-10-15 DIAGNOSIS — R059 Cough, unspecified: Secondary | ICD-10-CM | POA: Diagnosis not present

## 2022-10-15 DIAGNOSIS — J329 Chronic sinusitis, unspecified: Secondary | ICD-10-CM | POA: Diagnosis not present

## 2022-10-15 DIAGNOSIS — J343 Hypertrophy of nasal turbinates: Secondary | ICD-10-CM | POA: Diagnosis not present

## 2022-10-15 DIAGNOSIS — R490 Dysphonia: Secondary | ICD-10-CM

## 2022-10-15 DIAGNOSIS — K219 Gastro-esophageal reflux disease without esophagitis: Secondary | ICD-10-CM | POA: Diagnosis not present

## 2022-10-15 DIAGNOSIS — R0982 Postnasal drip: Secondary | ICD-10-CM | POA: Diagnosis not present

## 2022-10-15 DIAGNOSIS — J3489 Other specified disorders of nose and nasal sinuses: Secondary | ICD-10-CM

## 2022-10-15 DIAGNOSIS — J069 Acute upper respiratory infection, unspecified: Secondary | ICD-10-CM

## 2022-10-15 DIAGNOSIS — R0981 Nasal congestion: Secondary | ICD-10-CM

## 2022-10-15 DIAGNOSIS — J342 Deviated nasal septum: Secondary | ICD-10-CM

## 2022-10-15 DIAGNOSIS — M26623 Arthralgia of bilateral temporomandibular joint: Secondary | ICD-10-CM

## 2022-10-15 DIAGNOSIS — J3089 Other allergic rhinitis: Secondary | ICD-10-CM

## 2022-10-15 MED ORDER — METHYLPREDNISOLONE 4 MG PO TBPK: ORAL_TABLET | ORAL | 1 refills | Status: DC

## 2022-10-15 MED ORDER — CETIRIZINE HCL 10 MG PO TABS: 10.0000 mg | ORAL_TABLET | Freq: Every day | ORAL | 11 refills | Status: DC

## 2022-10-15 MED ORDER — FLUTICASONE PROPIONATE 50 MCG/ACT NA SUSP: 2.0000 | Freq: Two times a day (BID) | NASAL | 6 refills | Status: DC

## 2022-10-15 MED ORDER — DOXYCYCLINE HYCLATE 100 MG PO TABS: 100.0000 mg | ORAL_TABLET | Freq: Two times a day (BID) | ORAL | 0 refills | Status: DC

## 2022-10-15 NOTE — Patient Instructions (Addendum)
-   continue Pepcid and start reflux gourmet - start antibiotic and steroid prescribed to you - continue allergy pill and continue Flonase nasal spray twice daily  - if cough continues you should see your PCP and get imaging to rule out pneumonia     Lloyd Huger Med Nasal Saline Rinse   - start nasal saline rinses with NeilMed Bottle available over the counter or online to help with nasal congestion

## 2022-10-15 NOTE — Progress Notes (Addendum)
ENT Progress Note:  Update 10/15/22: She reports raspy voice x 2 weeks and nasal congestion with post-nasal drainage. She was not able to breath through her nose x 1 week. She has had cough for 1-2 weeks. She has had deep cough but no sputum. These sx developed 2 weeks ago, no medications for this thus far. Here to discuss workup results and to check on her sx. on Flonase and daily antihistamine.  On Pepcid for reflux.   Initial Evaluation 07/12/22  Reason for Consult: recurrent ear discomfort and facial pain/pressure    Referring Physician:  self-referred   HPI: Sarah Wong is an 57 y.o. female is here for recurrent ear aches x 20 yrs, come and go with exacerbation of about once a month , dull achy, associated with facial pain/pressure and headaches. She reports last time it happened 6 months ago, and she received a prescription for oral antibiotic. She has GERD and on PPI.  No sinus surgeries or ear surgeries in the past.  Not on nasal spray or antihistamine right now, thinks she has nasal congestion. She has always lived here in [place]. Denies decreased sense of smell, denies itchy or watery eyes. No prior sinus scans.  Hx of septoplasty when she was in her 25's.  Hx of UPPP for mild OSA per report    Records Reviewed:  Hx of mitral valve prolapse  Had neck lump 2019, and U/S + CT neck results were c/w non-pathologic lymph node     Past Medical History:  Diagnosis Date   Edema, lower extremity    Fundic gland polyps of stomach, benign    GERD (gastroesophageal reflux disease)    History of kidney stones    HTN (hypertension)    Hyperlipidemia    history of, no medications now   Mild obstructive sleep apnea    PER STUDY 07-07-2010--  MILD OSA/  NO CPAP RX     MVP (mitral valve prolapse)    Palpitations    Renal calculus, bilateral    NON-OBSTRUTIVE   Right ureteral stone    Sleep apnea    Splenic flexure syndrome     Past Surgical History:  Procedure Laterality Date    ANKLE SURGERY Left    CARDIOVASCULAR STRESS TEST  11-08-2009  [facility]   NORMAL PERFUSION STUDY/ NO ISCHEMIA/ EF 63%   CYSTOSCOPY W/ URETERAL STENT PLACEMENT  05/21/2011   Procedure: CYSTOSCOPY WITH RETROGRADE PYELOGRAM/URETERAL STENT PLACEMENT;  Surgeon: Anner Crete, MD;  Location: [facility];  Service: Urology;  Laterality: Left;   CYSTOSCOPY WITH STENT PLACEMENT Right 11/20/2012   Procedure: CYSTOSCOPY WITH STENT PLACEMENT;  Surgeon: Bjorn Pippin, MD;  Location: [facility];  Service: Urology;  Laterality: Right;   CYSTOSCOPY WITH URETEROSCOPY, STONE BASKETRY AND STENT PLACEMENT Right 11/20/2012   Procedure: RIGHT URETEROSCOPY,BALLOON DILITATION, STONE EXTRACTION  ;  Surgeon: Bjorn Pippin, MD;  Location: [facility];  Service: Urology;  Laterality: Right;   EXTRACORPOREAL SHOCK WAVE LITHOTRIPSY  LEFT 05-31-2011/   RIGHT 10-30-2012   EXTRACORPOREAL SHOCK WAVE LITHOTRIPSY Right 03/14/2017   Procedure: RIGHT EXTRACORPOREAL SHOCK WAVE LITHOTRIPSY (ESWL);  Surgeon: Alfredo Martinez, MD;  Location: [facility];  Service: Urology;  Laterality: Right;   HAND SURGERY Right 11/2016   trigger finger   HOLMIUM LASER APPLICATION Right 11/20/2012   Procedure: HOLMIUM LASER APPLICATION;  Surgeon: Bjorn Pippin, MD;  Location[facility];  Service: Urology;  Laterality: Right;   NASAL SEPTUM SURGERY  DEC  2013   RIGHT WRIST EXPLORATION/ CAPSULOTOMY/ TENOLYSIS  03-30-2002   TENDON REPAIR Left 09/06/2016   Procedure: Tenolysis of left peroneal tendons with repair of peroneus brevus;  Surgeon: Toni Arthurs, MD;  Location: [facility];  Service: Orthopedics;  Laterality: Left;   TRANSTHORACIC ECHOCARDIOGRAM  03-27-2012   NORMAL LVF/  EF 55-60%/  NO EVIDENCE MVP   TUBAL LIGATION  1994   VAGINAL HYSTERECTOMY  1999   WRIST GANGLION EXCISION Right 11-14-2001    Family History  Problem Relation Age of Onset   Hypertension Mother    Diabetes Mother    COPD Mother     Heart disease Mother 60       "aortic replacement" CABG.  Died age 29   Hyperlipidemia Mother    Sudden death Mother    Thyroid disease Mother    Sleep apnea Mother    Obesity Mother    Hypertension Father    Heart failure Father        open heart surgery   Hyperlipidemia Father    Heart disease Father    Cancer Father 21       lung cancer   Gout Brother    Stroke Maternal Grandmother    Colon polyps Neg Hx    Rectal cancer Neg Hx    Stomach cancer Neg Hx    Esophageal cancer Neg Hx     Social History:  reports that she has never smoked. She has been exposed to tobacco smoke. She has never used smokeless tobacco. She reports that she does not drink alcohol and does not use drugs.  Allergies:  Allergies  Allergen Reactions   Penicillins Hives and Rash   Vancomycin Swelling    Other reaction(s): Redness   Iohexol Hives     Code: HIVES, Desc: HIVES WITH IV CONTRAST MEDIA- PT REQUIRES 13 HR PRE-MEDS, Onset Date: [id number]    Ivp Dye [Iodinated Contrast Media] Hives    Medications: I have reviewed the patient's current medications.  No results found for this or any previous visit (from the past 48 hour(s)).  No results found.  The PMH, PSH, Medications, Allergies, and SH were reviewed and updated.  ROS: Constitutional: Negative for fever, weight loss and weight gain. Cardiovascular: Negative for chest pain and dyspnea on exertion. Respiratory: Is not experiencing shortness of breath at rest. Gastrointestinal: Negative for nausea and vomiting. Neurological: Negative for headaches. Psychiatric: The patient is not nervous/anxiou  Blood pressure 124/75, pulse 60, SpO2 97%.  PHYSICAL EXAM:  Exam: General: Well-developed, well-nourished Respiratory Respiratory effort: Equal inspiration and expiration without stridor Cardiovascular Peripheral Vascular: Warm extremities with equal color/perfusion Eyes: No nystagmus with equal extraocular motion  bilaterally Neuro/Psych/Balance: Patient oriented to person, place, and time; Appropriate mood and affect; Gait is intact with no imbalance; Cranial nerves I-XII are intact Head and Face Inspection: Normocephalic and atraumatic without mass or lesion Palpation: Facial skeleton intact without bony stepoffs Salivary Glands: No mass or tenderness Facial Strength: Facial motility symmetric and full bilaterally ENT Pinna: External ear intact and fully developed External canal: Canal is patent with intact skin Tympanic Membrane: Clear and mobile External Nose: No scar or anatomic deformity Internal Nose: Septum intact and midline. No edema, polyp, or rhinorrhea Lips, Teeth, and gums: Mucosa and teeth intact and viable TMJ: pain to palpation with full mobility Oral cavity/oropharynx: No erythema or exudate, no lesions present Nasopharynx: No mass or lesion with intact mucosa Hypopharynx: Intact mucosa without pooling of secretions Larynx Glottic: Full  true vocal cord mobility without lesion or mass Supraglottic: Normal appearing epiglottis and AE folds Interarytenoid Space: moderate pachydermia edema Subglottic Space: Patent without lesion or edema Neck Neck and Trachea: Midline trachea without mass or lesion Thyroid: No mass or nodularity Lymphatics: No lymphadenopathy  Procedure:   PROCEDURE NOTE: nasal endoscopy  Preoperative diagnosis: chronic sinusitis symptoms  Postoperative diagnosis: same  Procedure: Diagnostic nasal endoscopy (69629)  Surgeon: Ashok Croon, M.D.  Anesthesia: Topical lidocaine and Afrin  H&P REVIEW: The patient's history and physical were reviewed today prior to procedure. All medications were reviewed and updated as well. Complications: None Condition is stable throughout exam Indications and consent: The patient presents with symptoms of chronic sinusitis not responding to previous therapies. All the risks, benefits, and potential complications were  reviewed with the patient preoperatively and informed consent was obtained. The time out was completed with confirmation of the correct procedure.   Procedure: The patient was seated upright in the clinic. Topical lidocaine and Afrin were applied to the nasal cavity. After adequate anesthesia had occurred, the rigid nasal endoscope was passed into the nasal cavity. The nasal mucosa, turbinates, septum, and sinus drainage pathways were visualized bilaterally. This revealed no purulence or significant secretions that might be cultured. There were no polyps or sites of significant inflammation. The mucosa was intact and there no crusting present. The scope was then slowly withdrawn and the patient tolerated the procedure well. There were no complications or blood loss.   Preoperative diagnosis: hoarseness, cough, throat discomfort concern for laryngitis   Postoperative diagnosis:   Same + GERD/LPR    Procedure: Flexible fiberoptic laryngoscopy  Surgeon: Ashok Croon, MD  Anesthesia: Topical lidocaine and Afrin Complications: None Condition is stable throughout exam  Indications and consent:  The patient presents to the clinic with Indirect laryngoscopy view was incomplete. Thus it was recommended that they undergo a flexible fiberoptic laryngoscopy. All of the risks, benefits, and potential complications were reviewed with the patient preoperatively and verbal informed consent was obtained.  Procedure: The patient was seated upright in the clinic. Topical lidocaine and Afrin were applied to the nasal cavity. After adequate anesthesia had occurred, I then proceeded to pass the flexible telescope into the nasal cavity. The nasal cavity was patent without rhinorrhea or polyp. The nasopharynx was also patent without mass or lesion. The base of tongue was visualized and was normal. There were no signs of pooling of secretions in the piriform sinuses. The true vocal folds were mobile bilaterally. There  were no signs of glottic or supraglottic mucosal lesion or mass. There was moderate interarytenoid pachydermia and post cricoid edema. The telescope was then slowly withdrawn and the patient tolerated the procedure throughout.   Studies Reviewed:  CT neck w/o contrast 09/19/2017 -I personally reviewed the scan and there is no evidence of chronic sinus inflammation on the scan back in 2019 Radiology Report  CLINICAL DATA:  Lump in the left posterior neck. Indeterminate findings at ultrasound.   EXAM: CT NECK WITHOUT CONTRAST   TECHNIQUE: Multidetector CT imaging of the neck was performed following the standard protocol without intravenous contrast.   COMPARISON:  Ultrasound 08/20/2017.  CT 05/02/2016.   FINDINGS: Pharynx and larynx: No mucosal or submucosal lesion.   Salivary glands: Parotid and submandibular glands are normal.   Thyroid: Normal   Lymph nodes: No worrisome lymph nodes. The lymph node visible at sonography in the left posterior neck is demonstrated measuring 9 x 6 mm, with a prominent fatty hilum, explaining  the ultrasound appearance. There is no evidence of pathologic node or other soft tissue mass in the region. The appearance is unchanged since the prior CT.   Vascular: No abnormal vascular finding.   Limited intracranial: Normal   Visualized orbits: Normal   Mastoids and visualized paranasal sinuses: Clear   Skeleton: Ordinary mild cervical spondylosis.   Upper chest: Normal   Other: None   IMPRESSION: The ultrasound finding appears to correlate with a largely fatty replaced lymph node in the left posterior neck, which is unchanged when compared to the ultrasound and a CT scan of 05/02/2016. There is no worrisome finding at CT. No evidence of worrisome lymph node or of any soft tissue mass.  08/30/22 FINDINGS: Osseous: No fracture or mandibular dislocation. No destructive process.   Orbits: Negative. No traumatic or inflammatory finding.    Sinuses: Moderate mucosal thickening of the right sphenoid sinus and mild scattered mucosal thickening of left ethmoid air cells. Sinuses are otherwise clear. No air-fluid levels.   Nasal cavity: Opacified left olfactory cleft.   Soft tissues: Negative.   Limited intracranial: No significant or unexpected finding.   IMPRESSION: 1. Right sphenoid and left ethmoid air cell mucosal thickening. Otherwise, clear sinuses. 2. Opacified left olfactory cleft. Recommend correlation with direct inspection.     Assessment/Plan: Encounter Diagnoses  Name Primary?   Nasal obstruction    Bilateral temporomandibular joint pain    Deviated nasal septum    Environmental and seasonal allergies    Hypertrophy of both inferior nasal turbinates    Chronic nasal congestion Yes   Upper respiratory tract infection, unspecified type     57 year old female with history of chronic recurrent symptoms of ear discomfort and facial pain and pressure she also has history of chronic headaches that come and go is here for initial evaluation with me she was previously followed by [facility] who retired since their last visit a few days few years ago she never had imaging to evaluate for chronic sinus inflammation such as sinus CT and denies having sinus surgery although she did have septoplasty when she was 18 and also had a UPPP for mild sleep apnea.  She takes PPI for GERD and when she stops the medication she reports acute reflux symptoms she is not on nasal sprays or antihistamines right now but feels that she does have nasal congestion at baseline in the past for similar pain she would get a prescription for oral antibiotic and it would typically help her she is here for evaluation due to recurrent episodes that typically happen at least once a month had 3 courses of oral antibiotic in the past 12 months. Her exam including nasal endoscopy revealed no signs of bacterial sinus infection, but we will obtain CT  sinuses to rule that out. She had bilateral TMJ tenderness and her ear sx could be related to that, since her ear exam today was unremarkable.  If sinus CT is negative I suspect that she has underlying seasonal environmental allergies and we will treat her empirically with Zyrtec and Flonase all we wait for scan results.  If she continues to have headaches we will make a referral for her to see neurology in the future.  She will return in 3 months for symptom check and to review her scans.    - use Motrin as needed for TMJ (jaw joint pain) - discuss with PCP about alternatives for reflux medication to avoid long-term side effects  - start Flonase and Zyrtec for  nasal congestion and use it every day - use nasal saline spray several times a day and Vaseline to prevent nasal dryness and nose bleeds - schedule CT of your sinuses  - return in 3 months   Update 10/15/22 she returns with symptoms of URI, including raspy voice nasal congestion postnasal drainage.  Had CT of the sinuses done 6 weeks ago, which I personally reviewed and it did not demonstrate chronic sinus inflammation.  There is minimal thickening along the left olfactory cleft and some ethmoid sinuses minimal thickening and sphenoid sinuses but no significant sinus disease.  She did have inferior turbinate hypertrophy and mild septal deviation on the scan as well.  On Zyrtec and Flonase daily.   Her flexible laryngoscopy and repeat nasal endoscopy today with evidence of significant mucosal edema and clear drainage throughout nasal passages and in the nasopharynx consistent with history of environmental allergies and suspected URI, no purulence was noted.  She also had findings consistent with GERD LPR, but no vocal fold lesions.  2 weeks of hoarseness and worsening of nasal congestion -suspect related to URI and exacerbation of nasal congestion postnasal drainage -Medrol Dosepak and Doxy 100 mg twice daily for 10 days -Nasal saline rinses  with NeilMed bottle -Continue Flonase 2 puffs bilateral nares twice daily and Zyrtec 10 mg daily  2.  Environmental allergies and nasal congestion in the setting of inferior turban hypertrophy and current URI -Medical management of allergies as above -Will consider allergy testing in the future  3.  GERD LPR -Continue medical management with Pepcid 20 mg twice daily -Diet and lifestyle changes to minimize reflux -Trial of reflux Gourmet  4.  Cough in the setting of URI symptoms x 2 weeks -Steroid and antibiotic as above -I advised the patient to seek care with her PCP if symptoms will not resolve in the next couple of weeks and to request chest x-ray at the time of follow-up with PCP  RTC 3 months  Thank you for allowing me to participate in the care of this patient. Please do not hesitate to contact me with any questions or concerns.   Ashok Croon, MD Otolaryngology [facility] Phone: [phone] Fax: [phone]    10/16/2022, 5:56 AM

## 2022-10-16 ENCOUNTER — Ambulatory Visit: Payer: 59 | Admitting: Gastroenterology

## 2022-10-31 ENCOUNTER — Ambulatory Visit: Payer: 59 | Admitting: Gastroenterology

## 2022-10-31 ENCOUNTER — Encounter: Payer: Self-pay | Admitting: Gastroenterology

## 2022-10-31 ENCOUNTER — Other Ambulatory Visit (INDEPENDENT_AMBULATORY_CARE_PROVIDER_SITE_OTHER): Payer: 59

## 2022-10-31 VITALS — BP 126/70 | HR 83 | Ht 60.0 in | Wt 168.2 lb

## 2022-10-31 DIAGNOSIS — K219 Gastro-esophageal reflux disease without esophagitis: Secondary | ICD-10-CM

## 2022-10-31 DIAGNOSIS — R1013 Epigastric pain: Secondary | ICD-10-CM | POA: Diagnosis not present

## 2022-10-31 LAB — CBC WITH DIFFERENTIAL/PLATELET
Basophils Absolute: 0 10*3/uL (ref 0.0–0.1)
Basophils Relative: 0.6 % (ref 0.0–3.0)
Eosinophils Absolute: 0.1 10*3/uL (ref 0.0–0.7)
Eosinophils Relative: 1 % (ref 0.0–5.0)
HCT: 39.4 % (ref 36.0–46.0)
Hemoglobin: 12.8 g/dL (ref 12.0–15.0)
Lymphocytes Relative: 36.9 % (ref 12.0–46.0)
Lymphs Abs: 2.8 10*3/uL (ref 0.7–4.0)
MCHC: 32.4 g/dL (ref 30.0–36.0)
MCV: 96.4 fL (ref 78.0–100.0)
Monocytes Absolute: 0.3 10*3/uL (ref 0.1–1.0)
Monocytes Relative: 4.6 % (ref 3.0–12.0)
Neutro Abs: 4.3 10*3/uL (ref 1.4–7.7)
Neutrophils Relative %: 56.9 % (ref 43.0–77.0)
Platelets: 157 10*3/uL (ref 150.0–400.0)
RBC: 4.09 Mil/uL (ref 3.87–5.11)
RDW: 12.7 % (ref 11.5–15.5)
WBC: 7.5 10*3/uL (ref 4.0–10.5)

## 2022-10-31 LAB — COMPREHENSIVE METABOLIC PANEL
ALT: 27 U/L (ref 0–35)
AST: 34 U/L (ref 0–37)
Albumin: 4 g/dL (ref 3.5–5.2)
Alkaline Phosphatase: 59 U/L (ref 39–117)
BUN: 12 mg/dL (ref 6–23)
CO2: 28 meq/L (ref 19–32)
Calcium: 9.3 mg/dL (ref 8.4–10.5)
Chloride: 105 meq/L (ref 96–112)
Creatinine, Ser: 0.8 mg/dL (ref 0.40–1.20)
GFR: 80.63 mL/min (ref >60.00)
Glucose, Bld: 103 mg/dL — ABNORMAL HIGH (ref 70–99)
Potassium: 3.9 meq/L (ref 3.5–5.1)
Sodium: 140 meq/L (ref 135–145)
Total Bilirubin: 0.4 mg/dL (ref 0.2–1.2)
Total Protein: 7 g/dL (ref 6.0–8.3)

## 2022-10-31 LAB — TSH: TSH: 2.16 u[IU]/mL (ref 0.35–5.50)

## 2022-10-31 NOTE — Patient Instructions (Addendum)
Your provider has requested that you go to the basement level for lab work before leaving today. Press "B" on the elevator. The lab is located at the first door on the left as you exit the elevator.  You have been scheduled for an abdominal ultrasound at [facility] (1st floor of hospital) on 11/02/22 at 11:30am. Please arrive 30 minutes prior to your appointment for registration. Make certain not to have anything to eat or drink 6 hours prior to your appointment. Should you need to reschedule your appointment, please contact radiology at [phone]. This test typically takes about 30 minutes to perform.  The [facility] providers would like to encourage you to use [facility] to communicate with providers for non-urgent requests or questions.  Due to long hold times on the telephone, sending your provider a message by [facility] may be a faster and more efficient way to get a response.  Please allow 48 business hours for a response.  Please remember that this is for non-urgent requests.   Due to recent changes in healthcare laws, you may see the results of your imaging and laboratory studies on MyChart before your provider has had a chance to review them.  We understand that in some cases there may be results that are confusing or concerning to you. Not all laboratory results come back in the same time frame and the provider may be waiting for multiple results in order to interpret others.  Please give [place] 48 hours in order for your provider to thoroughly review all the results before contacting the office for clarification of your results.   Thank you for choosing me and [facility].  Venita Lick. Pleas Koch., MD., Clementeen Graham

## 2022-10-31 NOTE — Progress Notes (Signed)
    Assessment     Intermittent epigastric pain - R/O cholelithiasis, biliary dyskinesia IBS-D, symptoms only mildly at this time GERD, well controlled on current regimen CRC screening, up-to-date   Recommendations    RUQ [place], pending consider CCK HIDA CBC, CMP, TSH, tTG, IgA Continue pantoprazole 40 mg po bid and famotidine 20 mg qd REV in 2 months   HPI    This is a 57 year old female who relates intermittent episodes of epigastric pain that can last for up to 4 days at a time.  Her epigastric pain is associated with nausea and often starts after a meal.  She can go a few weeks at a time without symptoms.  Is not clear what brings on her symptoms when they recur.  EGD and CT AP last year as below.  She has occasional mild diarrhea related to certain foods but generally her IBS has been an active.  Diarrhea does not correlate well with her epigastric pain.  She relates at times she has lower sternal tenderness which feels different than her epigastric tenderness.  The lower sternal tenderness response to Tylenol however her epigastric pain does not.  EGD Jul 2023 - Normal esophagus.  - Erythematous mucosa in the gastric body and antrum. Biopsied.  - Multiple gastric polyps. Biopsied.  - Normal duodenal bulb and second portion of the duodenum. Path: mild chronic gastritis, fundic gland polyps   CT AP Mar 2023 No acute process Nephrolithiasis   Labs / Imaging       Latest Ref Rng & Units 12/18/2021   10:09 AM 03/14/2021    9:01 AM 12/06/2020   10:58 AM  Hepatic Function  Total Protein 6.0 - 8.3 g/dL 7.4  6.8  7.2   Albumin 3.5 - 5.2 g/dL 4.3  4.1  4.2   AST 0 - 37 U/L 33  16  20   ALT 0 - 35 U/L 30  13  14    Alk Phosphatase 39 - 117 U/L 51  62  54   Total Bilirubin 0.2 - 1.2 mg/dL 0.8  0.5  0.5   Bilirubin, Direct 0.0 - 0.3 mg/dL 0.1   0.1        Latest Ref Rng & Units 12/18/2021   10:09 AM 03/14/2021    9:01 AM 12/06/2020   10:58 AM  CBC  WBC 4.0 - 10.5 K/uL 5.9   6.7  6.7   Hemoglobin 12.0 - 15.0 g/dL 59.5  63.8  75.6   Hematocrit 36.0 - 46.0 % 40.1  38.6  41.4   Platelets 150.0 - 400.0 K/uL 205.0  183.0  178.0     Current Medications, Allergies, Past Medical History, Past Surgical History, Family History and Social History were reviewed in [employer] record.   Physical Exam: General: Well developed, well nourished, no acute distress Head: Normocephalic and atraumatic Eyes: Sclerae anicteric, EOMI Ears: Normal auditory acuity Mouth: No deformities or lesions noted Lungs: Clear throughout to auscultation Heart: Regular rate and rhythm; No murmurs, rubs or bruits Abdomen: Soft, mild epigastric tenderness and non distended. No masses, hepatosplenomegaly or hernias noted. Normal Bowel sounds Rectal: Not done Musculoskeletal: Symmetrical with no gross deformities  Pulses:  Normal pulses noted Extremities: No edema or deformities noted Neurological: Alert oriented x 4, grossly nonfocal Psychological:  Alert and cooperative. Normal mood and affect   Brison Fiumara T. Russella Dar, MD 10/31/2022, 2:11 PM

## 2022-11-01 LAB — IGA: Immunoglobulin A: 217 mg/dL (ref 47–310)

## 2022-11-01 LAB — TISSUE TRANSGLUTAMINASE, IGA: (tTG) Ab, IgA: <1 U/mL

## 2022-11-02 ENCOUNTER — Other Ambulatory Visit: Payer: Self-pay | Admitting: Cardiology

## 2022-11-02 ENCOUNTER — Ambulatory Visit (HOSPITAL_COMMUNITY): Payer: 59

## 2022-11-08 ENCOUNTER — Ambulatory Visit (HOSPITAL_COMMUNITY)
Admission: RE | Admit: 2022-11-08 | Discharge: 2022-11-08 | Disposition: A | Discharge status: Home / Self Care | Payer: 59 | Source: Ambulatory Visit | Attending: Gastroenterology | Admitting: Gastroenterology

## 2022-11-08 DIAGNOSIS — R1013 Epigastric pain: Secondary | ICD-10-CM | POA: Insufficient documentation

## 2022-11-12 ENCOUNTER — Other Ambulatory Visit: Payer: Self-pay

## 2022-11-12 ENCOUNTER — Encounter: Payer: Self-pay | Admitting: Gastroenterology

## 2022-11-12 DIAGNOSIS — R1013 Epigastric pain: Secondary | ICD-10-CM

## 2022-11-12 NOTE — Telephone Encounter (Signed)
Spoke with the pt over the phone regarding results.

## 2022-11-15 DIAGNOSIS — I2510 Atherosclerotic heart disease of native coronary artery without angina pectoris: Secondary | ICD-10-CM | POA: Insufficient documentation

## 2022-11-15 NOTE — Progress Notes (Signed)
  Cardiology Office Note:   Date:  11/16/2022  ID:  Sarah Wong, DOB [birth year], MRN [id number] PCP: Kristian Covey, MD  [facility] Providers Cardiologist:  Rollene Rotunda, MD {  History of Present Illness:   Sarah Wong is a 57 y.o. female the patient presents for evaluation having previously been seen by Dr. Alanda Amass.  He followed her for many years for her mitral valve prolapse. Echo from 2014 demonstrated no evidence of prolapse and no regurgitation. He has also followed her because of a family history of early onset heart disease.   A coronary calcium score from 2018 demonstrated no evidence of calcium.   She had a history of MVP but echo 2021 did not show this and it was essentially normal.     I saw her in August for eval of chest pain.  She had a CT with mild non calcified plaque of 20 - 49% stenosis in the LAD.  She was started on Crestor.  Since I last saw her she is continue to have some right upper quadrant abdominal discomfort and some rare substernal chest discomfort but she is overall doing really well.  She is being evaluated for fatty liver.  She is going to start exercising once she is not working 60-hour weeks which she thinks is coming pretty soon.  She is starting to think about it and work on her diet.   ROS: As stated in the HPI and negative for all other systems.  Studies Reviewed:    EKG:   NA  Risk Assessment/Calculations:      Physical Exam:   VS:  BP 102/62   Pulse (!) 56   Ht 5' (1.524 m)   Wt 167 lb 6.4 oz (75.9 kg)   SpO2 96%   BMI 32.69 kg/m    Wt Readings from Last 3 Encounters:  11/16/22 167 lb 6.4 oz (75.9 kg)  10/31/22 168 lb 3.2 oz (76.3 kg)  08/03/22 166 lb (75.3 kg)     GEN: Well nourished, well developed in no acute distress NECK: No JVD; No carotid bruits CARDIAC: RRR, no murmurs, rubs, gallops RESPIRATORY:  Clear to auscultation without rales, wheezing or rhonchi  ABDOMEN: Soft, non-tender,  non-distended EXTREMITIES:  No edema; No deformity   ASSESSMENT AND PLAN:   BRADYCARDIA:    She is not having any symptoms related to this.  No change in therapy.   CHEST PAIN: She has nonobstructive disease as described and her symptoms are nonanginal.  I do not think there is further ischemia workup that needs to be done and she will continue with primary risk reduction.   DYSLIPIDEMIA:     LDL was 68 and she is on Crestor with repeat lipid pending.  My goal will be to 11s.  OBESITY: We again talked about diet and exercise.  She is hopeful that she can achieve a healthier lifestyle once her work week is less onerous.    Follow up with me in 18 months.   Signed, Rollene Rotunda, MD

## 2022-11-16 ENCOUNTER — Encounter: Payer: Self-pay | Admitting: Cardiology

## 2022-11-16 ENCOUNTER — Ambulatory Visit: Payer: 59 | Attending: Cardiology | Admitting: Cardiology

## 2022-11-16 VITALS — BP 102/62 | HR 56 | Ht 60.0 in | Wt 167.4 lb

## 2022-11-16 DIAGNOSIS — E785 Hyperlipidemia, unspecified: Secondary | ICD-10-CM

## 2022-11-16 DIAGNOSIS — I2510 Atherosclerotic heart disease of native coronary artery without angina pectoris: Secondary | ICD-10-CM

## 2022-11-16 DIAGNOSIS — I341 Nonrheumatic mitral (valve) prolapse: Secondary | ICD-10-CM | POA: Diagnosis not present

## 2022-11-16 NOTE — Patient Instructions (Signed)
Medication Instructions:  Your physician recommends that you continue on your current medications as directed. Please refer to the Current Medication list given to you today.  *If you need a refill on your cardiac medications before your next appointment, please call your pharmacy*  Lab Work: If you have labs (blood work) drawn today and your tests are completely normal, you will receive your results only by: MyChart Message (if you have MyChart) OR A paper copy in the mail If you have any lab test that is abnormal or we need to change your treatment, we will call you to review the results.  Testing/Procedures: None ordered today.  Follow-Up: At [facility], you and your health needs are our priority.  As part of our continuing mission to provide you with exceptional heart care, we have created designated Provider Care Teams.  These Care Teams include your primary Cardiologist (physician) and Advanced Practice Providers (APPs -  Physician Assistants and Nurse Practitioners) who all work together to provide you with the care you need, when you need it.  We recommend signing up for the patient portal called "MyChart".  Sign up information is provided on this After Visit Summary.  MyChart is used to connect with patients for Virtual Visits (Telemedicine).  Patients are able to view lab/test results, encounter notes, upcoming appointments, etc.  Non-urgent messages can be sent to your provider as well.   To learn more about what you can do with MyChart, go to [email or web address].    Your next appointment:   18 month(s)  Provider:   Rollene Rotunda, MD

## 2022-11-20 ENCOUNTER — Ambulatory Visit (HOSPITAL_COMMUNITY)
Admission: RE | Admit: 2022-11-20 | Discharge: 2022-11-20 | Disposition: A | Discharge status: Home / Self Care | Payer: 59 | Source: Ambulatory Visit | Attending: Gastroenterology | Admitting: Gastroenterology

## 2022-11-20 DIAGNOSIS — R1013 Epigastric pain: Secondary | ICD-10-CM | POA: Diagnosis present

## 2022-11-20 MED ORDER — TECHNETIUM TC 99M MEBROFENIN IV KIT
5.1500 | PACK | Freq: Once | INTRAVENOUS | Status: AC | PRN
  Administered 2022-11-20: 5.15 via INTRAVENOUS

## 2022-12-04 ENCOUNTER — Encounter: Payer: Self-pay | Admitting: *Deleted

## 2022-12-10 ENCOUNTER — Telehealth: Payer: Self-pay | Admitting: Family Medicine

## 2022-12-10 NOTE — Telephone Encounter (Signed)
Patient informed of the message below and voiced understanding  

## 2022-12-10 NOTE — Telephone Encounter (Signed)
 Patient informed of the results and voiced understanding

## 2022-12-10 NOTE — Telephone Encounter (Signed)
Pt was scheduled for her physical on 12/13 at 3:00 pm. She is doing fasting labs to check her cholesterol with her heart doctor earlier that day and wanted to know if those labs could be used as part of her physical labs here. Call back number: [phone]. Please advise.

## 2022-12-14 ENCOUNTER — Ambulatory Visit (INDEPENDENT_AMBULATORY_CARE_PROVIDER_SITE_OTHER): Payer: 59 | Admitting: Family Medicine

## 2022-12-14 ENCOUNTER — Encounter: Payer: Self-pay | Admitting: Family Medicine

## 2022-12-14 VITALS — BP 120/70 | HR 65 | Temp 97.8°F | Ht 60.0 in | Wt 169.8 lb

## 2022-12-14 DIAGNOSIS — Z124 Encounter for screening for malignant neoplasm of cervix: Secondary | ICD-10-CM | POA: Diagnosis not present

## 2022-12-14 DIAGNOSIS — R739 Hyperglycemia, unspecified: Secondary | ICD-10-CM

## 2022-12-14 DIAGNOSIS — R1013 Epigastric pain: Secondary | ICD-10-CM

## 2022-12-14 DIAGNOSIS — Z0001 Encounter for general adult medical examination with abnormal findings: Secondary | ICD-10-CM | POA: Diagnosis not present

## 2022-12-14 DIAGNOSIS — Z0000 Encounter for general adult medical examination without abnormal findings: Secondary | ICD-10-CM

## 2022-12-14 DIAGNOSIS — Z23 Encounter for immunization: Secondary | ICD-10-CM

## 2022-12-14 LAB — POCT GLYCOSYLATED HEMOGLOBIN (HGB A1C): Hemoglobin A1C: 5.9 % — AB (ref 4.0–5.6)

## 2022-12-14 NOTE — Progress Notes (Signed)
Established Patient Office Visit  Subjective   Patient ID: Sarah Wong, female    DOB: 1965/01/14  Age: 57 y.o. MRN: [id number]  No chief complaint on file.   HPI   Sarah Wong is seen for physical exam.  He had recent CT morphology study with coronary calcium score of 0 but did have some mild proximal LAD soft plaque which was 30 to 49%.  He is now on statin and had recent labs per cardiology.   Had some ongoing abdominal pains epigastric area after eating and is seeing GI for this.  Recent ultrasound showed no gallstones.  No recent melena or hematemesis.  Her other medical problems include history of kidney stones, dyslipidemia, stress urine incontinence, and history of prediabetes range blood sugars..  Health maintenance reviewed  Health Maintenance  Topic Date Due   HIV Screening  Never done   COVID-19 Vaccine (2 - 2024-25 season) 09/02/2022   Cervical Cancer Screening (HPV/Pap Cotest)  12/14/2023 (Originally 01/01/2010)   MAMMOGRAM  05/30/2024   DTaP/Tdap/Td (2 - Td or Tdap) 06/13/2027   Colonoscopy  10/26/2028   INFLUENZA VACCINE  Completed   Hepatitis C Screening  Completed   Zoster Vaccines- Shingrix  Completed   HPV VACCINES  Aged Out   -She states she has had hysterectomy for benign disease and no indication for further Pap smears.  She still sees GYN for mammograms. -Does still need flu vaccine today  Social history-she is married.  She has 2 daughters.  One is a Clinical research associate here in town.  Her daughter who lived in [place] previously has moved back here.  Her husband is in the [employer] and they are currently at [employer].    She has a few grandchildren.  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  Non-smoker.  No regular alcohol.   Family history-her mother had COPD, type 2 diabetes, hypertension, CAD.  Father had lung cancer as well as hypertension and hyperlipidemia.  She has a brother with gout.  Patient's mother also apparently had gout. As above, brother diagnosed  with CAD age 71 recently  Past Medical History:  Diagnosis Date   Edema, lower extremity    Fundic gland polyps of stomach, benign    GERD (gastroesophageal reflux disease)    History of kidney stones    HTN (hypertension)    Hyperlipidemia    history of, no medications now   Mild obstructive sleep apnea    PER STUDY 07-07-2010--  MILD OSA/  NO CPAP RX     MVP (mitral valve prolapse)    Palpitations    Renal calculus, bilateral    NON-OBSTRUTIVE   Right ureteral stone    Sleep apnea    Splenic flexure syndrome    Past Surgical History:  Procedure Laterality Date   ANKLE SURGERY Left    CARDIOVASCULAR STRESS TEST  11-08-2009  [facility]   NORMAL PERFUSION STUDY/ NO ISCHEMIA/ EF 63%   CYSTOSCOPY W/ URETERAL STENT PLACEMENT  05/21/2011   Procedure: CYSTOSCOPY WITH RETROGRADE PYELOGRAM/URETERAL STENT PLACEMENT;  Surgeon: Anner Crete, MD;  Location: [facility];  Service: Urology;  Laterality: Left;   CYSTOSCOPY WITH STENT PLACEMENT Right 11/20/2012   Procedure: CYSTOSCOPY WITH STENT PLACEMENT;  Surgeon: Bjorn Pippin, MD;  Location[facility];  Service: Urology;  Laterality: Right;   CYSTOSCOPY WITH URETEROSCOPY, STONE BASKETRY AND STENT PLACEMENT Right 11/20/2012   Procedure: RIGHT URETEROSCOPY,BALLOON DILITATION, STONE EXTRACTION  ;  Surgeon: Bjorn Pippin, MD;  Location: [facility];  Service: Urology;  Laterality: Right;   EXTRACORPOREAL SHOCK WAVE LITHOTRIPSY  LEFT 05-31-2011/   RIGHT 10-30-2012   EXTRACORPOREAL SHOCK WAVE LITHOTRIPSY Right 03/14/2017   Procedure: RIGHT EXTRACORPOREAL SHOCK WAVE LITHOTRIPSY (ESWL);  Surgeon: Alfredo Martinez, MD;  Location: [facility];  Service: Urology;  Laterality: Right;   HAND SURGERY Right 11/2016   trigger finger   HOLMIUM LASER APPLICATION Right 11/20/2012   Procedure: HOLMIUM LASER APPLICATION;  Surgeon: Bjorn Pippin, MD;  Location: [facility];  Service: Urology;  Laterality: Right;   NASAL SEPTUM  SURGERY  DEC 2013   RIGHT WRIST EXPLORATION/ CAPSULOTOMY/ TENOLYSIS  03-30-2002   TENDON REPAIR Left 09/06/2016   Procedure: Tenolysis of left peroneal tendons with repair of peroneus brevus;  Surgeon: Toni Arthurs, MD;  Location: [facility];  Service: Orthopedics;  Laterality: Left;   TRANSTHORACIC ECHOCARDIOGRAM  03-27-2012   NORMAL LVF/  EF 55-60%/  NO EVIDENCE MVP   TUBAL LIGATION  1994   VAGINAL HYSTERECTOMY  1999   WRIST GANGLION EXCISION Right 11-14-2001    reports that she has never smoked. She has been exposed to tobacco smoke. She has never used smokeless tobacco. She reports that she does not drink alcohol and does not use drugs. family history includes COPD in her mother; Cancer (age of onset: 47) in her father; Diabetes in her mother; Gout in her brother; Heart disease in her father; Heart disease (age of onset: 49) in her mother; Heart failure in her father; Hyperlipidemia in her father and mother; Hypertension in her father and mother; Obesity in her mother; Sleep apnea in her mother; Stroke in her maternal grandmother; Sudden death in her mother; Thyroid disease in her mother. Allergies  Allergen Reactions   Penicillins Hives and Rash   Vancomycin Swelling    Other reaction(s): Redness   Iohexol Hives     Code: HIVES, Desc: HIVES WITH IV CONTRAST MEDIA- PT REQUIRES 13 HR PRE-MEDS, Onset Date: [id number]    Ivp Dye [Iodinated Contrast Media] Hives    Review of Systems  Constitutional:  Negative for chills, fever, malaise/fatigue and weight loss.  HENT:  Negative for hearing loss.   Eyes:  Negative for blurred vision and double vision.  Respiratory:  Negative for cough and shortness of breath.   Cardiovascular:  Negative for chest pain, palpitations and leg swelling.  Gastrointestinal:  Positive for abdominal pain. Negative for blood in stool, constipation, diarrhea, nausea and vomiting.  Genitourinary:  Negative for dysuria.  Skin:  Negative for rash.   Neurological:  Negative for dizziness, speech change, seizures, loss of consciousness and headaches.  Psychiatric/Behavioral:  Negative for depression.       Objective:     BP 120/70 (BP Location: Left Arm, Patient Position: Sitting, Cuff Size: Normal)   Pulse 65   Temp 97.8 F (36.6 C) (Oral)   Ht 5' (1.524 m)   Wt 169 lb 12.8 oz (77 kg)   SpO2 97%   BMI 33.16 kg/m  BP Readings from Last 3 Encounters:  12/14/22 120/70  11/16/22 102/62  10/31/22 126/70   Wt Readings from Last 3 Encounters:  12/14/22 169 lb 12.8 oz (77 kg)  11/16/22 167 lb 6.4 oz (75.9 kg)  10/31/22 168 lb 3.2 oz (76.3 kg)      Physical Exam Vitals reviewed.  Constitutional:      Appearance: She is well-developed.  HENT:     Head: Normocephalic and atraumatic.  Eyes:  Pupils: Pupils are equal, round, and reactive to light.  Neck:     Thyroid: No thyromegaly.  Cardiovascular:     Rate and Rhythm: Normal rate and regular rhythm.     Heart sounds: Normal heart sounds. No murmur heard. Pulmonary:     Effort: No respiratory distress.     Breath sounds: Normal breath sounds. No wheezing or rales.  Abdominal:     General: Bowel sounds are normal. There is no distension.     Palpations: Abdomen is soft. There is no mass.     Tenderness: There is no guarding or rebound.     Comments: Mild epigastric tenderness.  No guarding or rebound.  No masses palpated.  Musculoskeletal:        General: Normal range of motion.     Cervical back: Normal range of motion and neck supple.  Lymphadenopathy:     Cervical: No cervical adenopathy.  Skin:    Findings: No rash.  Neurological:     Mental Status: She is alert and oriented to person, place, and time.     Cranial Nerves: No cranial nerve deficit.  Psychiatric:        Behavior: Behavior normal.        Thought Content: Thought content normal.        Judgment: Judgment normal.      Results for orders placed or performed in visit on 12/14/22  POC HgB  A1c  Result Value Ref Range   Hemoglobin A1C 5.9 (A) 4.0 - 5.6 %   HbA1c POC (<> result, manual entry)     HbA1c, POC (prediabetic range)     HbA1c, POC (controlled diabetic range)      Last CBC Lab Results  Component Value Date   WBC 7.5 10/31/2022   HGB 12.8 10/31/2022   HCT 39.4 10/31/2022   MCV 96.4 10/31/2022   MCH 31.2 07/16/2019   RDW 12.7 10/31/2022   PLT 157.0 10/31/2022   Last metabolic panel Lab Results  Component Value Date   GLUCOSE 103 (H) 10/31/2022   NA 140 10/31/2022   K 3.9 10/31/2022   CL 105 10/31/2022   CO2 28 10/31/2022   BUN 12 10/31/2022   CREATININE 0.80 10/31/2022   GFR 80.63 10/31/2022   CALCIUM 9.3 10/31/2022   PROT 7.0 10/31/2022   ALBUMIN 4.0 10/31/2022   LABGLOB 2.8 07/16/2019   AGRATIO 1.7 07/16/2019   BILITOT 0.4 10/31/2022   ALKPHOS 59 10/31/2022   AST 34 10/31/2022   ALT 27 10/31/2022   ANIONGAP 6 08/30/2016   Last lipids Lab Results  Component Value Date   CHOL 152 09/24/2022   HDL 64 09/24/2022   LDLCALC 67 09/24/2022   TRIG 119 09/24/2022   CHOLHDL 2.4 09/24/2022   Last hemoglobin A1c Lab Results  Component Value Date   HGBA1C 5.9 (A) 12/14/2022      The 10-year ASCVD risk score (Arnett DK, et al., 2019) is: 2.5%    Assessment & Plan:   Physical exam.  57 year old female with minimal nonobstructive plaque LAD.  Now on high-dose statin.  We discussed several health maintenance items as follows  -Positive family history of type 2 diabetes.  A1c today 5.9%.  Work on weight loss and establishing more consistent exercise and low glycemic diet.  Recommend at least yearly monitoring of A1c -Flu vaccine given -She plans to continue mammograms through her GYN -Tetanus up-to-date -Colonoscopy due 2030 -Prior hepatitis C screen negative -Shingrix completed -No indication for further  Pap smears with prior hysterectomy -We did recommend trying to establish goal of minimum 150 minutes of moderate intensity exercise per  week -She has had some ongoing epigastric pains.  Has seen GI.  Check Helicobacter pylori stool assay   No follow-ups on file.    Evelena Peat, MD

## 2022-12-15 LAB — HEPATIC FUNCTION PANEL
ALT: 28 [IU]/L (ref 0–32)
AST: 36 [IU]/L (ref 0–40)
Albumin: 4.2 g/dL (ref 3.8–4.9)
Alkaline Phosphatase: 72 [IU]/L (ref 44–121)
Bilirubin Total: 0.4 mg/dL (ref 0.0–1.2)
Bilirubin, Direct: 0.15 mg/dL (ref 0.00–0.40)
Total Protein: 6.8 g/dL (ref 6.0–8.5)

## 2022-12-15 LAB — LIPID PANEL
Chol/HDL Ratio: 2.3 {ratio} (ref 0.0–4.4)
Cholesterol, Total: 147 mg/dL (ref 100–199)
HDL: 65 mg/dL (ref >39)
LDL Chol Calc (NIH): 67 mg/dL (ref 0–99)
Triglycerides: 80 mg/dL (ref 0–149)
VLDL Cholesterol Cal: 15 mg/dL (ref 5–40)

## 2022-12-17 ENCOUNTER — Other Ambulatory Visit: Payer: Self-pay | Admitting: *Deleted

## 2022-12-17 DIAGNOSIS — I2510 Atherosclerotic heart disease of native coronary artery without angina pectoris: Secondary | ICD-10-CM

## 2022-12-17 DIAGNOSIS — E785 Hyperlipidemia, unspecified: Secondary | ICD-10-CM

## 2022-12-17 MED ORDER — EZETIMIBE 10 MG PO TABS: 10.0000 mg | ORAL_TABLET | Freq: Every day | ORAL | 3 refills | Status: DC

## 2022-12-18 ENCOUNTER — Encounter: Payer: Self-pay | Admitting: Cardiology

## 2022-12-21 ENCOUNTER — Encounter: Payer: 59 | Admitting: Family Medicine

## 2023-01-04 ENCOUNTER — Ambulatory Visit: Payer: 59 | Admitting: Family Medicine

## 2023-01-04 ENCOUNTER — Encounter: Payer: Self-pay | Admitting: Family Medicine

## 2023-01-04 VITALS — BP 118/80 | HR 94 | Temp 98.3°F | Ht 60.0 in | Wt 164.9 lb

## 2023-01-04 DIAGNOSIS — R051 Acute cough: Secondary | ICD-10-CM | POA: Diagnosis not present

## 2023-01-04 DIAGNOSIS — J069 Acute upper respiratory infection, unspecified: Secondary | ICD-10-CM | POA: Diagnosis not present

## 2023-01-04 LAB — POCT INFLUENZA A/B
Influenza A, POC: NEGATIVE
Influenza B, POC: NEGATIVE

## 2023-01-04 LAB — POC COVID19 BINAXNOW: SARS Coronavirus 2 Ag: NEGATIVE

## 2023-01-04 LAB — POCT RAPID STREP A (OFFICE): Rapid Strep A Screen: NEGATIVE

## 2023-01-04 MED ORDER — BENZONATATE 100 MG PO CAPS: 100.0000 mg | ORAL_CAPSULE | Freq: Three times a day (TID) | ORAL | 0 refills | Status: DC | PRN

## 2023-01-04 MED ORDER — ALBUTEROL SULFATE HFA 108 (90 BASE) MCG/ACT IN AERS: 2.0000 | INHALATION_SPRAY | Freq: Four times a day (QID) | RESPIRATORY_TRACT | 0 refills | Status: DC | PRN

## 2023-01-04 MED ORDER — PREDNISONE 20 MG PO TABS: ORAL_TABLET | ORAL | 0 refills | Status: DC

## 2023-01-04 NOTE — Progress Notes (Signed)
 Established Patient Office Visit  Subjective   Patient ID: Sarah Wong, female    DOB: 07/12/1965  Age: 58 y.o. MRN: [id number]  Chief Complaint  Patient presents with   Cough    Cough, congestion, sore throat, sinus drainage, fever, wheezing, started 3 days ago     HPI   Sarah Wong is seen with about 3-day onset of cough, earache bilaterally, nasal congestion, sore throat, low-grade fever, malaise, body aches, and some mild wheezing.  Denies any nausea, vomiting, or diarrhea.  She has taken some Tylenol  sporadically.  Only known sick contact was one of her coworkers.  Non-smoker.   Past Medical History:  Diagnosis Date   Edema, lower extremity    Fundic gland polyps of stomach, benign    GERD (gastroesophageal reflux disease)    History of kidney stones    HTN (hypertension)    Hyperlipidemia    history of, no medications now   Mild obstructive sleep apnea    PER STUDY 07-07-2010--  MILD OSA/  NO CPAP RX     MVP (mitral valve prolapse)    Palpitations    Renal calculus, bilateral    NON-OBSTRUTIVE   Right ureteral stone    Sleep apnea    Splenic flexure syndrome    Past Surgical History:  Procedure Laterality Date   ANKLE SURGERY Left    CARDIOVASCULAR STRESS TEST  11-08-2009  DR [facility]   NORMAL PERFUSION STUDY/ NO ISCHEMIA/ EF 63%   CYSTOSCOPY W/ URETERAL STENT PLACEMENT  05/21/2011   Procedure: CYSTOSCOPY WITH RETROGRADE PYELOGRAM/URETERAL STENT PLACEMENT;  Surgeon: Norleen JINNY Seltzer, MD;  Location: [facility];  Service: Urology;  Laterality: Left;   CYSTOSCOPY WITH STENT PLACEMENT Right 11/20/2012   Procedure: CYSTOSCOPY WITH STENT PLACEMENT;  Surgeon: Norleen Seltzer, MD;  Location: [facility];  Service: Urology;  Laterality: Right;   CYSTOSCOPY WITH URETEROSCOPY, STONE BASKETRY AND STENT PLACEMENT Right 11/20/2012   Procedure: RIGHT URETEROSCOPY,BALLOON DILITATION, STONE EXTRACTION  ;  Surgeon: Norleen Seltzer, MD;  Location: [facility];  Service:  Urology;  Laterality: Right;   EXTRACORPOREAL SHOCK WAVE LITHOTRIPSY  LEFT 05-31-2011/   RIGHT 10-30-2012   EXTRACORPOREAL SHOCK WAVE LITHOTRIPSY Right 03/14/2017   Procedure: RIGHT EXTRACORPOREAL SHOCK WAVE LITHOTRIPSY (ESWL);  Surgeon: Gaston Hamilton, MD;  [facility];  Service: Urology;  Laterality: Right;   HAND SURGERY Right 11/2016   trigger finger   HOLMIUM LASER APPLICATION Right 11/20/2012   Procedure: HOLMIUM LASER APPLICATION;  Surgeon: Norleen Seltzer, MD;  Location: [facility];  Service: Urology;  Laterality: Right;   NASAL SEPTUM SURGERY  DEC 2013   RIGHT WRIST EXPLORATION/ CAPSULOTOMY/ TENOLYSIS  03-30-2002   TENDON REPAIR Left 09/06/2016   Procedure: Tenolysis of left peroneal tendons with repair of peroneus brevus;  Surgeon: Kit Norleen, MD;  Location: [facility];  Service: Orthopedics;  Laterality: Left;   TRANSTHORACIC ECHOCARDIOGRAM  03-27-2012   NORMAL LVF/  EF 55-60%/  NO EVIDENCE MVP   TUBAL LIGATION  1994   VAGINAL HYSTERECTOMY  1999   WRIST GANGLION EXCISION Right 11-14-2001    reports that she has never smoked. She has been exposed to tobacco smoke. She has never used smokeless tobacco. She reports that she does not drink alcohol and does not use drugs. family history includes COPD in her mother; Cancer (age of onset: 78) in her father; Diabetes in her mother; Gout in her brother; Heart disease in her father; Heart disease (age of  onset: 65) in her mother; Heart failure in her father; Hyperlipidemia in her father and mother; Hypertension in her father and mother; Obesity in her mother; Sleep apnea in her mother; Stroke in her maternal grandmother; Sudden death in her mother; Thyroid  disease in her mother. Allergies  Allergen Reactions   Penicillins Hives and Rash   Vancomycin Swelling    Other reaction(s): Redness   Iohexol  Hives     Code: HIVES, Desc: HIVES WITH IV CONTRAST MEDIA- PT REQUIRES 13 HR PRE-MEDS, Onset Date: 11202008     Ivp Dye [Iodinated Contrast Media] Hives    Review of Systems  Constitutional:  Positive for chills, fever and malaise/fatigue.  HENT:  Positive for congestion, ear pain and sore throat.   Respiratory:  Positive for cough.   Cardiovascular:  Negative for chest pain.      Objective:     BP 118/80 (BP Location: Right Arm, Patient Position: Sitting, Cuff Size: Large)   Pulse 94   Temp 98.3 F (36.8 C) (Oral)   Ht 5' (1.524 m)   Wt 164 lb 14.4 oz (74.8 kg)   SpO2 96%   BMI 32.20 kg/m  BP Readings from Last 3 Encounters:  01/04/23 118/80  12/14/22 120/70  11/16/22 102/62   Wt Readings from Last 3 Encounters:  01/04/23 164 lb 14.4 oz (74.8 kg)  12/14/22 169 lb 12.8 oz (77 kg)  11/16/22 167 lb 6.4 oz (75.9 kg)      Physical Exam Vitals reviewed.  Constitutional:      General: She is not in acute distress.    Appearance: She is not ill-appearing.  Neck:     Comments: Minimal anterior cervical adenopathy bilaterally. Pulmonary:     Comments: Few faint expiratory wheezes but mostly clear.  No rales. Musculoskeletal:     Cervical back: Neck supple.  Neurological:     Mental Status: She is alert.      No results found for any visits on 01/04/23.    The 10-year ASCVD risk score (Arnett DK, et al., 2019) is: 2.3%    Assessment & Plan:   Probable viral URI with cough.  Strep, influenza, and COVID testing all negative.  -Plenty fluids and rest -Continue Tylenol  as needed for fever and bodyaches -tessalon  perles 100 mg every 8 hours prn cough -albuterol  MDI 2 puffs every 6 hours as needed -prednisone  20 mg two once daily for 5 days if wheezing persists or worsens.    Wolm Scarlet, MD

## 2023-01-07 ENCOUNTER — Encounter: Payer: Self-pay | Admitting: Family Medicine

## 2023-01-16 ENCOUNTER — Ambulatory Visit (INDEPENDENT_AMBULATORY_CARE_PROVIDER_SITE_OTHER): Payer: 59 | Admitting: Otolaryngology

## 2023-01-16 ENCOUNTER — Encounter (INDEPENDENT_AMBULATORY_CARE_PROVIDER_SITE_OTHER): Payer: Self-pay | Admitting: Otolaryngology

## 2023-01-16 VITALS — BP 160/82 | HR 63

## 2023-01-16 DIAGNOSIS — R0981 Nasal congestion: Secondary | ICD-10-CM

## 2023-01-16 DIAGNOSIS — J3489 Other specified disorders of nose and nasal sinuses: Secondary | ICD-10-CM

## 2023-01-16 DIAGNOSIS — J3089 Other allergic rhinitis: Secondary | ICD-10-CM

## 2023-01-16 MED ORDER — IPRATROPIUM BROMIDE 0.03 % NA SOLN: 2.0000 | Freq: Two times a day (BID) | NASAL | 12 refills | Status: DC

## 2023-01-16 NOTE — Patient Instructions (Signed)
 Lloyd Huger Med Nasal Saline Rinse   - start nasal saline rinses with NeilMed Bottle available over the counter or online to help with nasal congestion

## 2023-01-16 NOTE — Progress Notes (Signed)
 ENT Progress Note:  Update 01/16/23  Discussed the use of AI scribe software for clinical note transcription with the patient, who gave verbal consent to proceed.  History of Present Illness   The patient with a history of chronic nasal congestion and facial pressure, presents with persistent symptoms following a cold that started on New Year's Eve. Despite treatment with an inhaler, Tylenol , and a cough suppressant (Tessalon  Pearls), the patient continues to experience significant nasal congestion and facial pain. They report improvement in wheezing, but still have a stuffy nose and a sensation of drainage.  In the past, the patient was tested for allergies but was told that they were allergic to too many things to make a serum for allergy shots. They express interest in being retested given advancements in allergy management.  The patient's symptoms around New Year's Eve included facial and ear pain, significant congestion, difficulty coughing up mucus, and wheezing. They also experienced a fever on New Year's Day and the following Thursday. The patient denies any exposure to grandchildren or other potential sources of infection, but suspects possible exposure at work.  The patient also reports headaches, which they treat with Tylenol . They have not tried Motrin for these symptoms. They have been using Zyrtec  and Flonase  for nasal congestion and have previously tried a neti pot for nasal saline rinses. The patient reports variable amounts of nasal drainage.  In October, the patient was prescribed a dose pack and an antibiotic, which provided some relief. CT sinuses did not show chronic sinus inflammation.    Update 10/15/22: She reports raspy voice x 2 weeks and nasal congestion with post-nasal drainage. She was not able to breath through her nose x 1 week. She has had cough for 1-2 weeks. She has had deep cough but no sputum. These sx developed 2 weeks ago, no medications for this thus far. Here  to discuss workup results and to check on her sx. on Flonase  and daily antihistamine.  On Pepcid  for reflux.   Initial Evaluation 07/12/22  Reason for Consult: recurrent ear discomfort and facial pain/pressure    Referring Physician:  self-referred   HPI: Sarah Wong is an 58 y.o. female is here for recurrent ear aches x 20 yrs, come and go with exacerbation of about once a month , dull achy, associated with facial pain/pressure and headaches. She reports last time it happened 6 months ago, and she received a prescription for oral antibiotic. She has GERD and on PPI.  No sinus surgeries or ear surgeries in the past.  Not on nasal spray or antihistamine right now, thinks she has nasal congestion. She has always lived here in [place]. Denies decreased sense of smell, denies itchy or watery eyes. No prior sinus scans.  Hx of septoplasty when she was in her 53's.  Hx of UPPP for mild OSA per report    Records Reviewed:  Hx of mitral valve prolapse  Had neck lump 2019, and U/S + CT neck results were c/w non-pathologic lymph node     Past Medical History:  Diagnosis Date   Edema, lower extremity    Fundic gland polyps of stomach, benign    GERD (gastroesophageal reflux disease)    History of kidney stones    HTN (hypertension)    Hyperlipidemia    history of, no medications now   Mild obstructive sleep apnea    PER STUDY 07-07-2010--  MILD OSA/  NO CPAP RX     MVP (mitral valve prolapse)  Palpitations    Renal calculus, bilateral    NON-OBSTRUTIVE   Right ureteral stone    Sleep apnea    Splenic flexure syndrome     Past Surgical History:  Procedure Laterality Date   ANKLE SURGERY Left    CARDIOVASCULAR STRESS TEST  11-08-2009  DR [facility]   NORMAL PERFUSION STUDY/ NO ISCHEMIA/ EF 63%   CYSTOSCOPY W/ URETERAL STENT PLACEMENT  05/21/2011   Procedure: CYSTOSCOPY WITH RETROGRADE PYELOGRAM/URETERAL STENT PLACEMENT;  Surgeon: Willye Harvey, MD;  [facility];  Service: Urology;   Laterality: Left;   CYSTOSCOPY WITH STENT PLACEMENT Right 11/20/2012   Procedure: CYSTOSCOPY WITH STENT PLACEMENT;  Surgeon: Homero Luster, MD;  Location: [facility];  Service: Urology;  Laterality: Right;   CYSTOSCOPY WITH URETEROSCOPY, STONE BASKETRY AND STENT PLACEMENT Right 11/20/2012   Procedure: RIGHT URETEROSCOPY,BALLOON DILITATION, STONE EXTRACTION  ;  Surgeon: Homero Luster, MD;  Location: [facility];  Service: Urology;  Laterality: Right;   EXTRACORPOREAL SHOCK WAVE LITHOTRIPSY  LEFT 05-31-2011/   RIGHT 10-30-2012   EXTRACORPOREAL SHOCK WAVE LITHOTRIPSY Right 03/14/2017   Procedure: RIGHT EXTRACORPOREAL SHOCK WAVE LITHOTRIPSY (ESWL);  Surgeon: Erman Hayward, MD;  [facility];  Service: Urology;  Laterality: Right;   HAND SURGERY Right 11/2016   trigger finger   HOLMIUM LASER APPLICATION Right 11/20/2012   Procedure: HOLMIUM LASER APPLICATION;  Surgeon: Homero Luster, MD;  Location: [facility];  Service: Urology;  Laterality: Right;   NASAL SEPTUM SURGERY  DEC 2013   RIGHT WRIST EXPLORATION/ CAPSULOTOMY/ TENOLYSIS  03-30-2002   TENDON REPAIR Left 09/06/2016   Procedure: Tenolysis of left peroneal tendons with repair of peroneus brevus;  Surgeon: Amada Backer, MD;  Location: [facility];  Service: Orthopedics;  Laterality: Left;   TRANSTHORACIC ECHOCARDIOGRAM  03-27-2012   NORMAL LVF/  EF 55-60%/  NO EVIDENCE MVP   TUBAL LIGATION  1994   VAGINAL HYSTERECTOMY  1999   WRIST GANGLION EXCISION Right 11-14-2001    Family History  Problem Relation Age of Onset   Hypertension Mother    Diabetes Mother    COPD Mother    Heart disease Mother 61       "aortic replacement" CABG.  Died age 87   Hyperlipidemia Mother    Sudden death Mother    Thyroid  disease Mother    Sleep apnea Mother    Obesity Mother    Hypertension Father    Heart failure Father        open heart surgery   Hyperlipidemia Father    Heart disease Father     Cancer Father 27       lung cancer   Gout Brother    Stroke Maternal Grandmother    Colon polyps Neg Hx    Rectal cancer Neg Hx    Stomach cancer Neg Hx    Esophageal cancer Neg Hx     Social History:  reports that she has never smoked. She has been exposed to tobacco smoke. She has never used smokeless tobacco. She reports that she does not drink alcohol and does not use drugs.  Allergies:  Allergies  Allergen Reactions   Penicillins Hives, Rash and Other (See Comments)   Vancomycin Swelling    Other reaction(s): Redness   Iohexol  Hives     Code: HIVES, Desc: HIVES WITH IV CONTRAST MEDIA- PT REQUIRES 13 HR PRE-MEDS, Onset Date: 11202008    Ivp Dye [Iodinated Contrast Media] Hives  Medications: I have reviewed the patient's current medications.  No results found for this or any previous visit (from the past 48 hours).  No results found.  The PMH, PSH, Medications, Allergies, and SH were reviewed and updated.  ROS: Constitutional: Negative for fever, weight loss and weight gain. Cardiovascular: Negative for chest pain and dyspnea on exertion. Respiratory: Is not experiencing shortness of breath at rest. Gastrointestinal: Negative for nausea and vomiting. Neurological: Negative for headaches. Psychiatric: The patient is not nervous/anxiou  Blood pressure (!) 160/82, pulse 63, SpO2 96%.  PHYSICAL EXAM:  Exam: General: Well-developed, well-nourished Respiratory Respiratory effort: Equal inspiration and expiration without stridor Cardiovascular Peripheral Vascular: Warm extremities with equal color/perfusion Eyes: No nystagmus with equal extraocular motion bilaterally Neuro/Psych/Balance: Patient oriented to person, place, and time; Appropriate mood and affect; Gait is intact with no imbalance; Cranial nerves I-XII are intact Head and Face Inspection: Normocephalic and atraumatic without mass or lesion Palpation: Facial skeleton intact without bony  stepoffs Salivary Glands: No mass or tenderness Facial Strength: Facial motility symmetric and full bilaterally ENT Pinna: External ear intact and fully developed External canal: Canal is patent with intact skin Tympanic Membrane: Clear and mobile External Nose: No scar or anatomic deformity Internal Nose: Septum intact and midline on anterior rhinoscopy. No edema, polyp, or rhinorrhea Lips, Teeth, and gums: Mucosa and teeth intact and viable TMJ: pain to palpation with full mobility Oral cavity/oropharynx: No erythema or exudate, no lesions present Neck Neck and Trachea: Midline trachea without mass or lesion Thyroid : No mass or nodularity Lymphatics: No lymphadenopathy  Procedure: none  Studies Reviewed:  CT neck w/o contrast 09/19/2017 -I personally reviewed the scan and there is no evidence of chronic sinus inflammation on the scan back in 2019 Radiology Report  CLINICAL DATA:  Lump in the left posterior neck. Indeterminate findings at ultrasound.   EXAM: CT NECK WITHOUT CONTRAST   TECHNIQUE: Multidetector CT imaging of the neck was performed following the standard protocol without intravenous contrast.   COMPARISON:  Ultrasound 08/20/2017.  CT 05/02/2016.   FINDINGS: Pharynx and larynx: No mucosal or submucosal lesion.   Salivary glands: Parotid and submandibular glands are normal.   Thyroid : Normal   Lymph nodes: No worrisome lymph nodes. The lymph node visible at sonography in the left posterior neck is demonstrated measuring 9 x 6 mm, with a prominent fatty hilum, explaining the ultrasound appearance. There is no evidence of pathologic node or other soft tissue mass in the region. The appearance is unchanged since the prior CT.   Vascular: No abnormal vascular finding.   Limited intracranial: Normal   Visualized orbits: Normal   Mastoids and visualized paranasal sinuses: Clear   Skeleton: Ordinary mild cervical spondylosis.   Upper chest: Normal    Other: None   IMPRESSION: The ultrasound finding appears to correlate with a largely fatty replaced lymph node in the left posterior neck, which is unchanged when compared to the ultrasound and a CT scan of 05/02/2016. There is no worrisome finding at CT. No evidence of worrisome lymph node or of any soft tissue mass.  08/30/22 FINDINGS: Osseous: No fracture or mandibular dislocation. No destructive process.   Orbits: Negative. No traumatic or inflammatory finding.   Sinuses: Moderate mucosal thickening of the right sphenoid sinus and mild scattered mucosal thickening of left ethmoid air cells. Sinuses are otherwise clear. No air-fluid levels.   Nasal cavity: Opacified left olfactory cleft.   Soft tissues: Negative.   Limited intracranial: No significant or unexpected finding.  IMPRESSION: 1. Right sphenoid and left ethmoid air cell mucosal thickening. Otherwise, clear sinuses. 2. Opacified left olfactory cleft. Recommend correlation with direct inspection.     Assessment/Plan: No diagnosis found.   58 year old female with history of chronic recurrent symptoms of ear discomfort and facial pain and pressure she also has history of chronic headaches that come and go is here for initial evaluation with me she was previously followed by community E[facility] who retired since their last visit a few days few years ago she never had imaging to evaluate for chronic sinus inflammation such as sinus CT and denies having sinus surgery although she did have septoplasty when she was 18 and also had a UPPP for mild sleep apnea.  She takes PPI for GERD and when she stops the medication she reports acute reflux symptoms she is not on nasal sprays or antihistamines right now but feels that she does have nasal congestion at baseline in the past for similar pain she would get a prescription for oral antibiotic and it would typically help her she is here for evaluation due to recurrent episodes that  typically happen at least once a month had 3 courses of oral antibiotic in the past 12 months. Her exam including nasal endoscopy revealed no signs of bacterial sinus infection, but we will obtain CT sinuses to rule that out. She had bilateral TMJ tenderness and her ear sx could be related to that, since her ear exam today was unremarkable.  If sinus CT is negative I suspect that she has underlying seasonal environmental allergies and we will treat her empirically with Zyrtec  and Flonase  all we wait for scan results.  If she continues to have headaches we will make a referral for her to see neurology in the future.  She will return in 3 months for symptom check and to review her scans.    - use Motrin as needed for TMJ (jaw joint pain) - discuss with PCP about alternatives for reflux medication to avoid long-term side effects  - start Flonase  and Zyrtec  for nasal congestion and use it every day - use nasal saline spray several times a day and Vaseline to prevent nasal dryness and nose bleeds - schedule CT of your sinuses  - return in 3 months   Update 10/15/22 she returns with symptoms of URI, including raspy voice nasal congestion postnasal drainage.  Had CT of the sinuses done 6 weeks ago, which I personally reviewed and it did not demonstrate chronic sinus inflammation.  There is minimal thickening along the left olfactory cleft and some ethmoid sinuses minimal thickening and sphenoid sinuses but no significant sinus disease.  She did have inferior turbinate hypertrophy and mild septal deviation on the scan as well.  On Zyrtec  and Flonase  daily.   Her flexible laryngoscopy and repeat nasal endoscopy today with evidence of significant mucosal edema and clear drainage throughout nasal passages and in the nasopharynx consistent with history of environmental allergies and suspected URI, no purulence was noted.  She also had findings consistent with GERD LPR, but no vocal fold lesions.  2 weeks of  hoarseness and worsening of nasal congestion -suspect related to URI and exacerbation of nasal congestion postnasal drainage -Medrol  Dosepak and Doxy 100  mg twice daily for 10 days -Nasal saline rinses with NeilMed bottle -Continue Flonase  2 puffs bilateral nares twice daily and Zyrtec  10 mg daily  2.  Environmental allergies and nasal congestion in the setting of inferior turban hypertrophy and current URI -Medical management  of allergies as above -Will consider allergy testing in the future  3.  GERD LPR -Continue medical management with Pepcid  20 mg twice daily -Diet and lifestyle changes to minimize reflux -Trial of reflux Gourmet  4.  Cough in the setting of URI symptoms x 2 weeks -Steroid and antibiotic as above -I advised the patient to seek care with her PCP if symptoms will not resolve in the next couple of weeks and to request chest x-ray at the time of follow-up with PCP  RTC 3 months  Update 01/16/23  Assessment and Plan    Chronic Nasal Congestion Persistent nasal congestion with facial pain and nasal drainage, not fully resolved with current treatment. Symptoms ongoing since New Year's Eve. She was prescribed Tessalon  Pearls and albuterol  by PCP. She is still on daily Zyrtec , Flonase . CT sinuses was clear, ruling out chronic sinusitis. Possible allergic component suspected. No significant septal deviation noted. Discussed benefits of allergy testing and advancements in allergy management. Oral steroids not a long-term solution due to potential adverse effects. - Refer to asthma and allergy center for retesting and management - Prescribed ipratropium bromide  nasal spray in addition to Flonase  - both to be used BID, 2 puffs b/l nares  - Recommend nasal saline rinses once daily - Continue Zyrtec  10 mg daily  - Advise trial of Motrin for facial pain and headaches   Follow-up - No follow-up with ENT unless new ENT-related symptoms arise - Asthma and allergy center to contact  patient for appointment and allergy testing scheduling        Artice Last, MD Otolaryngology [facility] Phone: [phone] Fax: [phone]    01/16/2023, 1:33 PM  .ls

## 2023-02-06 ENCOUNTER — Encounter: Payer: Self-pay | Admitting: *Deleted

## 2023-02-12 ENCOUNTER — Telehealth (INDEPENDENT_AMBULATORY_CARE_PROVIDER_SITE_OTHER): Payer: Self-pay | Admitting: Otolaryngology

## 2023-02-12 ENCOUNTER — Encounter: Payer: Self-pay | Admitting: Cardiology

## 2023-02-12 NOTE — Telephone Encounter (Signed)
02/12/2023 called [facility] HIMS Dept to check on status of ROI from [facility] Payment Integrity Dept, Letter Reference # [id number].  I faxed Medical Records requests on 01/03/2023 & 01/31/2023. There was a 3rd request in our OnBase fax queue on 02/06/2023 for the same date of service.  I spoke with Jasmine @ [facility] HIMS Dept and she forwarded me to the [facility] HIMS Dept where the requests that I had faxed were forwarded to for processing.  I spoke w/ Adeline Shropshire in [facility] HIMS Dept and she stated they are not the custodians of the [facility] ENT Records, [facility] is.  She transferred me back to [place] at [facility].  Jasmine was able to transfer the requests from the [facility] HIMS Dept fax queue to the [facility] Dept fax queue so that the request can be processed.  I left a voicemail message for Lonell Face., Investigator, @ [facility] Payment Integrity Dept explaining there was a misunderstanding and the records will be processed and sent to them soon.

## 2023-02-18 NOTE — Progress Notes (Deleted)
 New Patient Note  RE: Sarah Wong MRN: [id number] DOB: 1965/09/03 Date of Office Visit: 02/19/2023  Consult requested by: Kristian Covey, MD Primary care provider: Kristian Covey, MD  Chief Complaint: No chief complaint on file.  History of Present Illness: I had the pleasure of seeing Mistie Adney for initial evaluation at the [facility] on 02/18/2023. She is a 58 y.o. female, who is referred here by Kristian Covey, MD for the evaluation of allergic rhinitis.  Discussed the use of AI scribe software for clinical note transcription with the patient, who gave verbal consent to proceed.  History of Present Illness             She reports symptoms of ***. Symptoms have been going on for *** years. The symptoms are present *** all year around with worsening in ***. Other triggers include exposure to ***. Anosmia: ***. Headache: ***. She has used *** with ***fair improvement in symptoms. Sinus infections: ***. Previous work up includes: ***. Previous ENT evaluation: ***. Previous sinus imaging: ***. History of nasal polyps: ***. Last eye exam: ***. History of reflux: ***.  01/16/2023 ENT visit: "Chronic Nasal Congestion Persistent nasal congestion with facial pain and nasal drainage, not fully resolved with current treatment. Symptoms ongoing since New Year's Eve. She was prescribed Tessalon Pearls and albuterol by PCP. She is still on daily Zyrtec, Flonase. CT sinuses was clear, ruling out chronic sinusitis. Possible allergic component suspected. No significant septal deviation noted. Discussed benefits of allergy testing and advancements in allergy management. Oral steroids not a long-term solution due to potential adverse effects. - Refer to asthma and allergy center for retesting and management - Prescribed ipratropium bromide nasal spray in addition to Flonase - both to be used BID, 2 puffs b/l nares  - Recommend nasal saline rinses once  daily - Continue Zyrtec 10 mg daily  - Advise trial of Motrin for facial pain and headaches"  Assessment and Plan: [name] is a 58 y.o. female with: ***  Assessment and Plan               No follow-ups on file.  No orders of the defined types were placed in this encounter.  Lab Orders  No laboratory test(s) ordered today    Other allergy screening: Asthma: {Blank single:19197::"yes","no"} Rhino conjunctivitis: {Blank single:19197::"yes","no"} Food allergy: {Blank single:19197::"yes","no"} Medication allergy: {Blank single:19197::"yes","no"} Hymenoptera allergy: {Blank single:19197::"yes","no"} Urticaria: {Blank single:19197::"yes","no"} Eczema:{Blank single:19197::"yes","no"} History of recurrent infections suggestive of immunodeficency: {Blank single:19197::"yes","no"}  Diagnostics: Spirometry:  Tracings reviewed. Her effort: {Blank single:19197::"Good reproducible efforts.","It was hard to get consistent efforts and there is a question as to whether this reflects a maximal maneuver.","Poor effort, data can not be interpreted."} FVC: ***L FEV1: ***L, ***% predicted FEV1/FVC ratio: ***% Interpretation: {Blank single:19197::"Spirometry consistent with mild obstructive disease","Spirometry consistent with moderate obstructive disease","Spirometry consistent with severe obstructive disease","Spirometry consistent with possible restrictive disease","Spirometry consistent with mixed obstructive and restrictive disease","Spirometry uninterpretable due to technique","Spirometry consistent with normal pattern","No overt abnormalities noted given today's efforts"}.  Please see scanned spirometry results for details.  Skin Testing: {Blank single:19197::"Select foods","Environmental allergy panel","Environmental allergy panel and select foods","Food allergy panel","None","Deferred due to recent antihistamines use"}. *** Results discussed with patient/family.   Past Medical  History: Patient Active Problem List   Diagnosis Date Noted  . Coronary artery disease involving native coronary artery of native heart without angina pectoris 11/15/2022  . At increased risk of exposure to COVID-19 virus 10/28/2019  . Vitamin  D deficiency 10/28/2019  . B12 deficiency 10/14/2019  . Educated about COVID-19 virus infection 09/29/2019  . Bradycardia 09/30/2018  . Mitral valve prolapse 09/30/2018  . Dyslipidemia 09/30/2018  . Herpes zoster without complication 07/26/2017  . Calculus of kidney and ureter 07/05/2016  . Stress incontinence of urine 07/05/2016  . Urge incontinence of urine 07/05/2016  . Dyspnea 10/12/2015  . MITRAL VALVE PROLAPSE 01/19/2009   Past Medical History:  Diagnosis Date  . Edema, lower extremity   . Fundic gland polyps of stomach, benign   . GERD (gastroesophageal reflux disease)   . History of kidney stones   . HTN (hypertension)   . Hyperlipidemia    history of, no medications now  . Mild obstructive sleep apnea    PER STUDY 07-07-2010--  MILD OSA/  NO CPAP RX    . MVP (mitral valve prolapse)   . Palpitations   . Renal calculus, bilateral    NON-OBSTRUTIVE  . Right ureteral stone   . Sleep apnea   . Splenic flexure syndrome    Past Surgical History: Past Surgical History:  Procedure Laterality Date  . ANKLE SURGERY Left   . CARDIOVASCULAR STRESS TEST  11-08-2009  DR Alanda Amass   NORMAL PERFUSION STUDY/ NO ISCHEMIA/ EF 63%  . CYSTOSCOPY W/ URETERAL STENT PLACEMENT  05/21/2011   Procedure: CYSTOSCOPY WITH RETROGRADE PYELOGRAM/URETERAL STENT PLACEMENT;  Surgeon: Anner Crete, MD;  Location: [facility];  Service: Urology;  Laterality: Left;  . CYSTOSCOPY WITH STENT PLACEMENT Right 11/20/2012   Procedure: CYSTOSCOPY WITH STENT PLACEMENT;  Surgeon: Bjorn Pippin, MD;  Location: [facility];  Service: Urology;  Laterality: Right;  . CYSTOSCOPY WITH URETEROSCOPY, STONE BASKETRY AND STENT PLACEMENT Right 11/20/2012   Procedure: RIGHT  URETEROSCOPY,BALLOON DILITATION, STONE EXTRACTION  ;  Surgeon: Bjorn Pippin, MD;  Location: [facility];  Service: Urology;  Laterality: Right;  . EXTRACORPOREAL SHOCK WAVE LITHOTRIPSY  LEFT 05-31-2011/   RIGHT 10-30-2012  . EXTRACORPOREAL SHOCK WAVE LITHOTRIPSY Right 03/14/2017   Procedure: RIGHT EXTRACORPOREAL SHOCK WAVE LITHOTRIPSY (ESWL);  Surgeon: Alfredo Martinez, MD;  Location: [facility];  Service: Urology;  Laterality: Right;  . HAND SURGERY Right 11/2016   trigger finger  . HOLMIUM LASER APPLICATION Right 11/20/2012   Procedure: HOLMIUM LASER APPLICATION;  Surgeon: Bjorn Pippin, MD;  Location: [facility];  Service: Urology;  Laterality: Right;  . NASAL SEPTUM SURGERY  DEC 2013  . RIGHT WRIST EXPLORATION/ CAPSULOTOMY/ TENOLYSIS  03-30-2002  . TENDON REPAIR Left 09/06/2016   Procedure: Tenolysis of left peroneal tendons with repair of peroneus brevus;  Surgeon: Toni Arthurs, MD;  Location: [facility];  Service: Orthopedics;  Laterality: Left;  . TRANSTHORACIC ECHOCARDIOGRAM  03-27-2012   NORMAL LVF/  EF 55-60%/  NO EVIDENCE MVP  . TUBAL LIGATION  1994  . VAGINAL HYSTERECTOMY  1999  . WRIST GANGLION EXCISION Right 11-14-2001   Medication List:  Current Outpatient Medications  Medication Sig Dispense Refill  . albuterol (VENTOLIN HFA) 108 (90 Base) MCG/ACT inhaler Inhale 2 puffs into the lungs every 6 (six) hours as needed for wheezing or shortness of breath. 8 g 0  . B Complex Vitamins (VITAMIN B COMPLEX PO) Take 500 mg by mouth.    . benzonatate (TESSALON PERLES) 100 MG capsule Take 1 capsule (100 mg total) by mouth 3 (three) times daily as needed. 30 capsule 0  . cetirizine (ZYRTEC) 10 MG tablet Take 1 tablet (10 mg total) by mouth daily. 30 tablet  11  . diltiazem (DILT-XR) 180 MG 24 hr capsule TAKE 1 CAPSULE (180 MG TOTAL) BY MOUTH EVERY MORNING 90 capsule 3  . doxycycline (VIBRA-TABS) 100 MG tablet Take 1 tablet (100 mg total) by mouth 2 (two)  times daily. (Patient not taking: Reported on 01/16/2023) 20 tablet 0  . estradiol (ESTRACE) 1 MG tablet Take 1 mg by mouth daily.    Marland Kitchen ezetimibe (ZETIA) 10 MG tablet Take 1 tablet (10 mg total) by mouth daily. 90 tablet 3  . famotidine (PEPCID) 20 MG tablet Take 1 hour prior to CT scan (Patient not taking: Reported on 01/16/2023) 1 tablet 0  . fluticasone (FLONASE) 50 MCG/ACT nasal spray Place 2 sprays into both nostrils 2 (two) times daily. 16 g 6  . furosemide (LASIX) 20 MG tablet TAKE 1 TABLET BY MOUTH EVERY DAY AS NEEDED 90 tablet 3  . ipratropium (ATROVENT) 0.03 % nasal spray Place 2 sprays into both nostrils every 12 (twelve) hours. 30 mL 12  . methylPREDNISolone (MEDROL DOSEPAK) 4 MG TBPK tablet Take with signs of chronic sinusitis and take as directed 1 each 1  . metoprolol succinate (TOPROL-XL) 25 MG 24 hr tablet TAKE 1 TABLET (25 MG TOTAL) BY MOUTH DAILY. (Patient not taking: Reported on 01/16/2023) 90 tablet 3  . metoprolol tartrate (LOPRESSOR) 25 MG tablet TAKE 2 HOURS PRIOR TO CT SCAN 1 tablet 0  . oxybutynin (DITROPAN) 5 MG tablet Take 5 mg by mouth daily.     . pantoprazole (PROTONIX) 40 MG tablet TAKE 1 TABLET BY MOUTH TWICE A DAY 180 tablet 3  . predniSONE (DELTASONE) 20 MG tablet Take two tablets by mouth once daily for 5 days. 10 tablet 0  . rosuvastatin (CRESTOR) 40 MG tablet Take 1 tablet (40 mg total) by mouth daily. 90 tablet 3  . XIIDRA 5 % SOLN      No current facility-administered medications for this visit.   Allergies: Allergies  Allergen Reactions  . Penicillins Hives, Rash and Other (See Comments)  . Vancomycin Swelling    Other reaction(s): Redness  . Iohexol Hives     Code: HIVES, Desc: HIVES WITH IV CONTRAST MEDIA- PT REQUIRES 13 HR PRE-MEDS, Onset Date: [id number]   . Ivp Dye [Iodinated Contrast Media] Hives   Social History: Social History   Socioeconomic History  . Marital status: Married    Spouse name: Not on file  . Number of children: 2  . Years  of education: Not on file  . Highest education level: Not on file  Occupational History  . Occupation: Holiday representative  Tobacco Use  . Smoking status: Never    Passive exposure: Past  . Smokeless tobacco: Never  Vaping Use  . Vaping status: Never Used  Substance and Sexual Activity  . Alcohol use: No  . Drug use: No  . Sexual activity: Not on file  Other Topics Concern  . Not on file  Social History Narrative   Lives with husband and two children.     Social Drivers of Corporate investment banker Strain: Not on file  Food Insecurity: Not on file  Transportation Needs: Not on file  Physical Activity: Not on file  Stress: Not on file  Social Connections: Not on file   Lives in a ***. Smoking: *** Occupation: ***  Environmental History: Water Damage/mildew in the house: Copywriter, advertising in the family room: {Blank single:19197::"yes","no"} Carpet in the bedroom: {Blank single:19197::"yes","no"} Heating: {Blank single:19197::"electric","gas","heat pump"} Cooling: {Blank single:19197::"central","window","heat pump"} Pet: {  Blank single:19197::"yes ***","no"}  Family History: Family History  Problem Relation Age of Onset  . Hypertension Mother   . Diabetes Mother   . COPD Mother   . Heart disease Mother 73       "aortic replacement" CABG.  Died age 51  . Hyperlipidemia Mother   . Sudden death Mother   . Thyroid disease Mother   . Sleep apnea Mother   . Obesity Mother   . Hypertension Father   . Heart failure Father        open heart surgery  . Hyperlipidemia Father   . Heart disease Father   . Cancer Father 46       lung cancer  . Gout Brother   . Stroke Maternal Grandmother   . Colon polyps Neg Hx   . Rectal cancer Neg Hx   . Stomach cancer Neg Hx   . Esophageal cancer Neg Hx    Problem                               Relation Asthma                                   *** Eczema                                *** Food allergy                           *** Allergic rhino conjunctivitis     ***  Review of Systems  Constitutional:  Negative for appetite change, chills, fever and unexpected weight change.  HENT:  Negative for congestion and rhinorrhea.   Eyes:  Negative for itching.  Respiratory:  Negative for cough, chest tightness, shortness of breath and wheezing.   Cardiovascular:  Negative for chest pain.  Gastrointestinal:  Negative for abdominal pain.  Genitourinary:  Negative for difficulty urinating.  Skin:  Negative for rash.  Neurological:  Negative for headaches.   Objective: There were no vitals taken for this visit. There is no height or weight on file to calculate BMI. Physical Exam Vitals and nursing note reviewed.  Constitutional:      Appearance: Normal appearance. She is well-developed.  HENT:     Head: Normocephalic and atraumatic.     Right Ear: Tympanic membrane and external ear normal.     Left Ear: Tympanic membrane and external ear normal.     Nose: Nose normal.     Mouth/Throat:     Mouth: Mucous membranes are moist.     Pharynx: Oropharynx is clear.  Eyes:     Conjunctiva/sclera: Conjunctivae normal.  Cardiovascular:     Rate and Rhythm: Normal rate and regular rhythm.     Heart sounds: Normal heart sounds. No murmur heard.    No friction rub. No gallop.  Pulmonary:     Effort: Pulmonary effort is normal.     Breath sounds: Normal breath sounds. No wheezing, rhonchi or rales.  Musculoskeletal:     Cervical back: Neck supple.  Skin:    General: Skin is warm.     Findings: No rash.  Neurological:     Mental Status: She is alert and oriented to person, place, and time.  Psychiatric:  Behavior: Behavior normal.  The plan was reviewed with the patient/family, and all questions/concerned were addressed.  It was my pleasure to see Gowri today and participate in her care. Please feel free to contact me with any questions or concerns.  Sincerely,  Wyline Mood, DO Allergy &  Immunology  [facility] office: [phone] [facility] office: [phone]

## 2023-02-19 ENCOUNTER — Ambulatory Visit: Payer: 59 | Admitting: Allergy

## 2023-02-26 ENCOUNTER — Telehealth (INDEPENDENT_AMBULATORY_CARE_PROVIDER_SITE_OTHER): Payer: Self-pay | Admitting: Otolaryngology

## 2023-02-26 NOTE — Telephone Encounter (Signed)
 Called [facility] Dept to check status of Medical Records Request sent 02/12/2023.  It is in process.

## 2023-03-16 LAB — LIPID PANEL
Chol/HDL Ratio: 2.1 ratio (ref 0.0–4.4)
Cholesterol, Total: 126 mg/dL (ref 100–199)
HDL: 60 mg/dL
LDL Chol Calc (NIH): 49 mg/dL (ref 0–99)
Triglycerides: 92 mg/dL (ref 0–149)
VLDL Cholesterol Cal: 17 mg/dL (ref 5–40)

## 2023-03-18 ENCOUNTER — Encounter: Payer: Self-pay | Admitting: *Deleted

## 2023-04-03 ENCOUNTER — Ambulatory Visit: Payer: 59 | Admitting: Gastroenterology

## 2023-06-03 LAB — HM MAMMOGRAPHY

## 2023-06-04 ENCOUNTER — Encounter: Payer: Self-pay | Admitting: Obstetrics & Gynecology

## 2023-06-04 ENCOUNTER — Ambulatory Visit: Admitting: Gastroenterology

## 2023-07-08 ENCOUNTER — Ambulatory Visit: Admitting: Gastroenterology

## 2023-07-08 ENCOUNTER — Encounter: Payer: Self-pay | Admitting: Gastroenterology

## 2023-07-08 VITALS — BP 124/72 | HR 63 | Ht 60.0 in | Wt 168.5 lb

## 2023-07-08 DIAGNOSIS — K219 Gastro-esophageal reflux disease without esophagitis: Secondary | ICD-10-CM | POA: Diagnosis not present

## 2023-07-08 DIAGNOSIS — R1010 Upper abdominal pain, unspecified: Secondary | ICD-10-CM | POA: Diagnosis not present

## 2023-07-08 DIAGNOSIS — R1013 Epigastric pain: Secondary | ICD-10-CM

## 2023-07-08 NOTE — Patient Instructions (Addendum)
 Start taking Gaviscon as needed at night  Continue taking Pantoprazole    Follow up in 3 months  If your blood pressure at your visit was 140/90 or greater, please contact your primary care physician to follow up on this.  _______________________________________________________  If you are age 60 or older, your body mass index should be between 23-30. Your Body mass index is 32.91 kg/m. If this is out of the aforementioned range listed, please consider follow up with your Primary Care Provider.  If you are age 71 or younger, your body mass index should be between 19-25. Your Body mass index is 32.91 kg/m. If this is out of the aformentioned range listed, please consider follow up with your Primary Care Provider.   ________________________________________________________  The [facility] providers would like to encourage you to use MYCHART to communicate with providers for non-urgent requests or questions.  Due to long hold times on the telephone, sending your provider a message by [facility] may be a faster and more efficient way to get a response.  Please allow 48 business hours for a response.  Please remember that this is for non-urgent requests.  _______________________________________________________  Thank you for entrusting me with your care and choosing [facility].  Dr Stacia

## 2023-07-08 NOTE — Progress Notes (Unsigned)
 Discussed the use of AI scribe software for clinical note transcription with the patient, who gave verbal consent to proceed.  HPI : Sarah Wong is a 58 year old female with GERD who presents with upper abdominal/chest pain.  She has experienced upper abdominal pain for several years, with a recent worsening over the past two weeks. The pain is described as a 'pushing' or pressure sensation, particularly noticeable when lying down. It localizes to the bottom of her sternum.  This pain is distinct from her heartburn which is an intense burning sensation. The pain tends to subside by morning after she falls asleep.  She experiences significant heartburn if she misses her medication, with symptoms including a burning sensation in her throat, especially at night. This heartburn is exacerbated by certain foods such as pizza, stew, and coffee, even when she takes her medication.  She has undergone extensive prior workup for chronic abdominal pain, including two upper endoscopies, a CT scan, an ultrasound, and a HIDA scan, all of which have not identified a clear cause. She does have a history of GERD and takes pantoprazole  and famotidine  regularly.   No issues with swallowing.  She experiences nausea with heartburn but does not vomit unless the heartburn is severe enough to wake her at night.  Her work and stress levels have been high recently, which may contribute to her symptoms. 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       Colonoscopy Oct 2020 Normal Repeat 10 years   EGD Oct 2020 Gastritis, fundic gland polyps  EGD Jul 2023 - Normal esophagus.  - Erythematous mucosa in the gastric body and antrum. Biopsied.  - Multiple gastric polyps. Biopsied.  - Normal duodenal bulb and second portion of the duodenum. Path: mild chronic gastritis, fundic gland polyps    CT AP Mar 2023 No acute  process Nephrolithiasis  RUQUS Nov 2024 IMPRESSION: 1. No acute abnormality identified. 2. Increased echotexture of the liver. This is a nonspecific finding but can be seen in fatty infiltration of liver.  HIDA Nov 2024 IMPRESSION: 1.  Patent cystic and common bile ducts.   2.  Normal gallbladder ejection fraction  Past Medical History:  Diagnosis Date   Edema, lower extremity    Fundic gland polyps of stomach, benign    GERD (gastroesophageal reflux disease)    History of kidney stones    HTN (hypertension)    Hyperlipidemia    history of, no medications now   Mild obstructive sleep apnea    PER STUDY 07-07-2010--  MILD OSA/  NO CPAP RX     MVP (mitral valve prolapse)    Palpitations    Renal calculus, bilateral    NON-OBSTRUTIVE   Right ureteral stone    Sleep apnea    Splenic flexure syndrome      Past Surgical History:  Procedure Laterality Date   ANKLE SURGERY Left    CARDIOVASCULAR STRESS TEST  11-08-2009  DR [facility]   NORMAL PERFUSION STUDY/ NO ISCHEMIA/ EF 63%   CYSTOSCOPY W/ URETERAL STENT PLACEMENT  05/21/2011   Procedure: CYSTOSCOPY WITH RETROGRADE PYELOGRAM/URETERAL STENT PLACEMENT;  Surgeon: Norleen JINNY Seltzer, MD;  Location: [facility];  Service: Urology;  Laterality: Left;   CYSTOSCOPY WITH STENT PLACEMENT Right 11/20/2012   Procedure: CYSTOSCOPY WITH STENT PLACEMENT;  Surgeon: Norleen Seltzer, MD;  Location: [facility];  Service: Urology;  Laterality: Right;   CYSTOSCOPY WITH  URETEROSCOPY, STONE BASKETRY AND STENT PLACEMENT Right 11/20/2012   Procedure: RIGHT URETEROSCOPY,BALLOON DILITATION, STONE EXTRACTION  ;  Surgeon: Norleen Seltzer, MD;  Location: [facility];  Service: Urology;  Laterality: Right;   EXTRACORPOREAL SHOCK WAVE LITHOTRIPSY  LEFT 05-31-2011/   RIGHT 10-30-2012   EXTRACORPOREAL SHOCK WAVE LITHOTRIPSY Right 03/14/2017   Procedure: RIGHT EXTRACORPOREAL SHOCK WAVE LITHOTRIPSY (ESWL);  Surgeon: Gaston Hamilton, MD;  Location:  [facility];  Service: Urology;  Laterality: Right;   HAND SURGERY Right 11/2016   trigger finger   HOLMIUM LASER APPLICATION Right 11/20/2012   Procedure: HOLMIUM LASER APPLICATION;  Surgeon: Norleen Seltzer, MD;  Location: [facility];  Service: Urology;  Laterality: Right;   NASAL SEPTUM SURGERY  DEC 2013   RIGHT WRIST EXPLORATION/ CAPSULOTOMY/ TENOLYSIS  03-30-2002   TENDON REPAIR Left 09/06/2016   Procedure: Tenolysis of left peroneal tendons with repair of peroneus brevus;  Surgeon: Kit Norleen, MD;  Location: [facility];  Service: Orthopedics;  Laterality: Left;   TRANSTHORACIC ECHOCARDIOGRAM  03-27-2012   NORMAL LVF/  EF 55-60%/  NO EVIDENCE MVP   TUBAL LIGATION  1994   VAGINAL HYSTERECTOMY  1999   WRIST GANGLION EXCISION Right 11-14-2001   Family History  Problem Relation Age of Onset   Hypertension Mother    Diabetes Mother    COPD Mother    Heart disease Mother 65       aortic replacement CABG.  Died age 28   Hyperlipidemia Mother    Sudden death Mother    Thyroid  disease Mother    Sleep apnea Mother    Obesity Mother    Hypertension Father    Heart failure Father        open heart surgery   Hyperlipidemia Father    Heart disease Father    Cancer Father 35       lung cancer   Gout Brother    Stroke Maternal Grandmother    Colon polyps Neg Hx    Rectal cancer Neg Hx    Stomach cancer Neg Hx    Esophageal cancer Neg Hx    Social History   Tobacco Use   Smoking status: Never    Passive exposure: Past   Smokeless tobacco: Never  Vaping Use   Vaping status: Never Used  Substance Use Topics   Alcohol use: No   Drug use: No   Current Outpatient Medications  Medication Sig Dispense Refill   albuterol  (VENTOLIN  HFA) 108 (90 Base) MCG/ACT inhaler Inhale 2 puffs into the lungs every 6 (six) hours as needed for wheezing or shortness of breath. 8 g 0   B Complex Vitamins (VITAMIN B COMPLEX PO) Take 500 mg by mouth.     benzonatate  (TESSALON   PERLES) 100 MG capsule Take 1 capsule (100 mg total) by mouth 3 (three) times daily as needed. 30 capsule 0   cetirizine  (ZYRTEC ) 10 MG tablet Take 1 tablet (10 mg total) by mouth daily. 30 tablet 11   diltiazem  (DILT-XR) 180 MG 24 hr capsule TAKE 1 CAPSULE (180 MG TOTAL) BY MOUTH EVERY MORNING 90 capsule 3   doxycycline  (VIBRA -TABS) 100 MG tablet Take 1 tablet (100 mg total) by mouth 2 (two) times daily. (Patient not taking: Reported on 01/16/2023) 20 tablet 0   estradiol (ESTRACE) 1 MG tablet Take 1 mg by mouth daily.     ezetimibe  (ZETIA ) 10 MG tablet Take 1 tablet (10 mg total) by mouth daily. 90 tablet 3  famotidine  (PEPCID ) 20 MG tablet Take 1 hour prior to CT scan (Patient not taking: Reported on 01/16/2023) 1 tablet 0   fluticasone  (FLONASE ) 50 MCG/ACT nasal spray Place 2 sprays into both nostrils 2 (two) times daily. 16 g 6   furosemide  (LASIX ) 20 MG tablet TAKE 1 TABLET BY MOUTH EVERY DAY AS NEEDED 90 tablet 3   ipratropium (ATROVENT ) 0.03 % nasal spray Place 2 sprays into both nostrils every 12 (twelve) hours. 30 mL 12   methylPREDNISolone  (MEDROL  DOSEPAK) 4 MG TBPK tablet Take with signs of chronic sinusitis and take as directed 1 each 1   metoprolol  succinate (TOPROL -XL) 25 MG 24 hr tablet TAKE 1 TABLET (25 MG TOTAL) BY MOUTH DAILY. (Patient not taking: Reported on 01/16/2023) 90 tablet 3   metoprolol  tartrate (LOPRESSOR ) 25 MG tablet TAKE 2 HOURS PRIOR TO CT SCAN 1 tablet 0   oxybutynin (DITROPAN) 5 MG tablet Take 5 mg by mouth daily.      pantoprazole  (PROTONIX ) 40 MG tablet TAKE 1 TABLET BY MOUTH TWICE A DAY 180 tablet 3   predniSONE  (DELTASONE ) 20 MG tablet Take two tablets by mouth once daily for 5 days. 10 tablet 0   rosuvastatin  (CRESTOR ) 40 MG tablet Take 1 tablet (40 mg total) by mouth daily. 90 tablet 3   XIIDRA 5 % SOLN      No current facility-administered medications for this visit.   Allergies  Allergen Reactions   Penicillins Hives, Rash and Other (See Comments)    Vancomycin Swelling    Other reaction(s): Redness   Iohexol  Hives     Code: HIVES, Desc: HIVES WITH IV CONTRAST MEDIA- PT REQUIRES 13 HR PRE-MEDS, Onset Date: [id number]    Ivp Dye [Iodinated Contrast Media] Hives     Review of Systems: All systems reviewed and negative except where noted in HPI.    No results found.  Physical Exam: BP 124/72 (BP Location: Left Arm, Patient Position: Sitting, Cuff Size: Normal)   Pulse 63   Ht 5' (1.524 m)   Wt 168 lb 8 oz (76.4 kg)   BMI 32.91 kg/m  Constitutional: Pleasant,well-developed, Caucasian female in no acute distress. HEENT: Normocephalic and atraumatic. Conjunctivae are normal. No scleral icterus. Cardiovascular: Normal rate, regular rhythm.  Pulmonary/chest: Effort normal and breath sounds normal. No wheezing, rales or rhonchi. Abdominal: Soft, nondistended, nontender. Bowel sounds active throughout. There are no masses palpable. No hepatomegaly. Extremities: no edema Neurological: Alert and oriented to person place and time. Skin: Skin is warm and dry. No rashes noted. Psychiatric: Normal mood and affect. Behavior is normal.  CBC    Component Value Date/Time   WBC 7.5 10/31/2022 1441   RBC 4.09 10/31/2022 1441   HGB 12.8 10/31/2022 1441   HGB 14.5 07/16/2019 1537   HCT 39.4 10/31/2022 1441   HCT 43.9 07/16/2019 1537   PLT 157.0 10/31/2022 1441   PLT 200 07/16/2019 1537   MCV 96.4 10/31/2022 1441   MCV 94 07/16/2019 1537   MCH 31.2 07/16/2019 1537   MCH 31.9 05/21/2011 1720   MCHC 32.4 10/31/2022 1441   RDW 12.7 10/31/2022 1441   RDW 12.2 07/16/2019 1537   LYMPHSABS 2.8 10/31/2022 1441   LYMPHSABS 2.9 07/16/2019 1537   MONOABS 0.3 10/31/2022 1441   EOSABS 0.1 10/31/2022 1441   EOSABS 0.1 07/16/2019 1537   BASOSABS 0.0 10/31/2022 1441   BASOSABS 0.1 07/16/2019 1537    CMP     Component Value Date/Time   NA 140 10/31/2022 1441  NA 144 07/16/2019 1537   K 3.9 10/31/2022 1441   CL 105 10/31/2022 1441   CO2 28  10/31/2022 1441   GLUCOSE 103 (H) 10/31/2022 1441   BUN 12 10/31/2022 1441   BUN 15 07/16/2019 1537   CREATININE 0.80 10/31/2022 1441   CALCIUM  9.3 10/31/2022 1441   PROT 6.8 12/14/2022 0823   ALBUMIN 4.2 12/14/2022 0823   AST 36 12/14/2022 0823   ALT 28 12/14/2022 0823   ALKPHOS 72 12/14/2022 0823   BILITOT 0.4 12/14/2022 0823   GFRNONAA 67 07/16/2019 1537   GFRAA 77 07/16/2019 1537       Latest Ref Rng & Units 10/31/2022    2:41 PM 12/18/2021   10:09 AM 03/14/2021    9:01 AM  CBC EXTENDED  WBC 4.0 - 10.5 K/uL 7.5  5.9  6.7   RBC 3.87 - 5.11 Mil/uL 4.09  4.26  4.09   Hemoglobin 12.0 - 15.0 g/dL 87.1  86.1  86.7   HCT 36.0 - 46.0 % 39.4  40.1  38.6   Platelets 150.0 - 400.0 K/uL 157.0  205.0  183.0   NEUT# 1.4 - 7.7 K/uL 4.3  3.1  4.0   Lymph# 0.7 - 4.0 K/uL 2.8  2.2  2.2       ASSESSMENT AND PLAN:  58 year old female with history of typical GERD symptoms of heartburn, which are well-controlled with right upper quadrant pain, now with several months of upper abdominal/chest pain described as a pressure sensation.  This is experienced almost exclusively in the evenings and with laying down.  Suspect this may be nonacid reflux.  We discussed how her medications reduce stomach acid, but do not prevent reflux of gastric contents.  Sometimes reflux of nonacid gastric contents can cause esophageal discomfort.  Low suspicion for cardiac cause given no exacerbation of symptoms with physical activity.   Gastroesophageal reflux disease (GERD)/upper abdominal  Chronic GERD with symptoms exacerbated by certain foods and stress. Symptoms partially controlled with pantoprazole  and famotidine . Non-acid reflux suspected. - Provide GERD diet handout. - Recommend Gaviscon as needed at night before bed. - Continue pantoprazole  and famotidine  as prescribed. - Advise dietary and behavioral modifications, including avoiding trigger foods, eating at least three hours before bedtime, and elevating  the head of the bed. - Discuss potential for antireflux surgery if symptoms persist despite medications and lifestyle changes. - Focus on managing GERD as a potential contributor to upper abdominal pain. - Reassess in three months if symptoms persist.  Recording duration: 16 minutes     Thomasena Vandenheuvel E. Stacia, MD [facility]    Burchette, Wolm ORN, MD

## 2023-07-30 ENCOUNTER — Encounter: Payer: Self-pay | Admitting: Cardiology

## 2023-08-01 NOTE — Telephone Encounter (Signed)
 Spoke with pt and notified her of her upcoming appointment. Pt aware. Pt verbalized understanding. All questions if any were answered.

## 2023-09-02 ENCOUNTER — Other Ambulatory Visit: Payer: Self-pay | Admitting: Cardiology

## 2023-09-02 ENCOUNTER — Other Ambulatory Visit (INDEPENDENT_AMBULATORY_CARE_PROVIDER_SITE_OTHER): Payer: Self-pay | Admitting: Otolaryngology

## 2023-09-02 NOTE — Progress Notes (Deleted)
  Cardiology Office Note:   Date:  09/02/2023  ID:  Sarah Wong [place], DOB 17-Aug-1965, MRN [id number] PCP: Micheal Wolm ORN, MD  [facility] Providers Cardiologist:  Lynwood Schilling, MD {  History of Present Illness:   Sarah Wong is a 58 y.o. female the patient presents for evaluation having previously been seen by Dr. Maye.  He followed her for many years for her mitral valve prolapse. Echo from 2014 demonstrated no evidence of prolapse and no regurgitation. He has also followed her because of a family history of early onset heart disease.   A coronary calcium  score from 2018 demonstrated no evidence of calcium .   She had a history of MVP but echo 2021 did not show this and it was essentially normal.     I saw her in August 2024 for eval of chest pain.  She had a CT with mild non calcified plaque of 20 - 49% stenosis in the LAD.  She was started on Crestor .  Since I last saw her ***   ***  she is continue to have some right upper quadrant abdominal discomfort and some rare substernal chest discomfort but she is overall doing really well.  She is being evaluated for fatty liver.  She is going to start exercising once she is not working 60-hour weeks which she thinks is coming pretty soon.  She is starting to think about it and work on her diet.   ROS: ***  Studies Reviewed:    EKG:       ***  Risk Assessment/Calculations:   {Does this patient have ATRIAL FIBRILLATION?:[id number]} No BP recorded.  {Refresh Note OR Click here to enter BP  :1}***        Physical Exam:   VS:  There were no vitals taken for this visit.   Wt Readings from Last 3 Encounters:  07/08/23 168 lb 8 oz (76.4 kg)  01/04/23 164 lb 14.4 oz (74.8 kg)  12/14/22 169 lb 12.8 oz (77 kg)     GEN: Well nourished, well developed in no acute distress NECK: No JVD; No carotid bruits CARDIAC: ***RR, *** murmurs, rubs, gallops RESPIRATORY:  Clear to auscultation without rales, wheezing or rhonchi   ABDOMEN: Soft, non-tender, non-distended EXTREMITIES:  No edema; No deformity   ASSESSMENT AND PLAN:   BRADYCARDIA:    *** She is not having any symptoms related to this.  No change in therapy.   CHEST PAIN: ***  She has nonobstructive disease as described and her symptoms are nonanginal.  I do not think there is further ischemia workup that needs to be done and she will continue with primary risk reduction.    DYSLIPIDEMIA:     LDL was *** 68 and she is on Crestor  with repeat lipid pending.  My goal will be to 59s.   OBESITY: ***  We again talked about diet and exercise.  She is hopeful that she can achieve a healthier lifestyle once her work week is less onerous.         Follow up ***  Signed, Lynwood Schilling, MD

## 2023-09-04 ENCOUNTER — Ambulatory Visit: Attending: Cardiovascular Disease | Admitting: Cardiology

## 2023-09-04 DIAGNOSIS — E785 Hyperlipidemia, unspecified: Secondary | ICD-10-CM

## 2023-09-04 DIAGNOSIS — R072 Precordial pain: Secondary | ICD-10-CM

## 2023-09-04 DIAGNOSIS — R001 Bradycardia, unspecified: Secondary | ICD-10-CM

## 2023-09-30 DIAGNOSIS — R072 Precordial pain: Secondary | ICD-10-CM | POA: Insufficient documentation

## 2023-09-30 NOTE — Progress Notes (Unsigned)
  Cardiology Office Note:   Date:  09/30/2023  ID:  Sarah Wong, DOB 11-26-1965, MRN [id number] PCP: Micheal Wolm ORN, MD  [facility] Providers Cardiologist:  Lynwood Schilling, MD {  History of Present Illness:   Sarah Wong is a 58 y.o. female patient presents for evaluation having previously been seen by Dr. Maye.  He followed her for many years for her mitral valve prolapse. Echo from 2014 demonstrated no evidence of prolapse and no regurgitation. He has also followed her because of a family history of early onset heart disease.   A coronary calcium  score from 2018 demonstrated no evidence of calcium .   She had a history of MVP but echo 2021 did not show this and it was essentially normal.     I saw her in August 2024 she had chest pain and had a CT with mild non calcified plaque of 20 - 49% stenosis in the LAD.  She was started on Crestor ..  Since I last saw her ***   ***  Since I last saw her she is continue to have some right upper quadrant abdominal discomfort and some rare substernal chest discomfort but she is overall doing really well.  She is being evaluated for fatty liver.  She is going to start exercising once she is not working 60-hour weeks which she thinks is coming pretty soon.  She is starting to think about it and work on her diet.   ROS: ***  Studies Reviewed:    EKG:       ***  Risk Assessment/Calculations:   {Does this patient have ATRIAL FIBRILLATION?:[id number]} No BP recorded.  {Refresh Note OR Click here to enter BP  :1}***        Physical Exam:   VS:  There were no vitals taken for this visit.   Wt Readings from Last 3 Encounters:  07/08/23 168 lb 8 oz (76.4 kg)  01/04/23 164 lb 14.4 oz (74.8 kg)  12/14/22 169 lb 12.8 oz (77 kg)     GEN: Well nourished, well developed in no acute distress NECK: No JVD; No carotid bruits CARDIAC: ***RR, *** murmurs, rubs, gallops RESPIRATORY:  Clear to auscultation without rales, wheezing or  rhonchi  ABDOMEN: Soft, non-tender, non-distended EXTREMITIES:  No edema; No deformity   ASSESSMENT AND PLAN:   BRADYCARDIA:  ***    She is not having any symptoms related to this.  No change in therapy.   CHEST PAIN: ***  She has nonobstructive disease as described and her symptoms are nonanginal.  I do not think there is further ischemia workup that needs to be done and she will continue with primary risk reduction.    DYSLIPIDEMIA:     LDL was ***  68 and she is on Crestor  with repeat lipid pending.  My goal will be to 48s.   OBESITY: ***  We again talked about diet and exercise.  She is hopeful that she can achieve a healthier lifestyle once her work week is less onerous.         Follow up ***  Signed, Lynwood Schilling, MD

## 2023-10-02 ENCOUNTER — Encounter: Payer: Self-pay | Admitting: Cardiology

## 2023-10-02 ENCOUNTER — Ambulatory Visit: Attending: Internal Medicine | Admitting: Cardiology

## 2023-10-02 VITALS — BP 130/82 | HR 46 | Ht 60.0 in | Wt 169.0 lb

## 2023-10-02 DIAGNOSIS — R072 Precordial pain: Secondary | ICD-10-CM | POA: Diagnosis not present

## 2023-10-02 DIAGNOSIS — E785 Hyperlipidemia, unspecified: Secondary | ICD-10-CM | POA: Diagnosis not present

## 2023-10-02 DIAGNOSIS — R001 Bradycardia, unspecified: Secondary | ICD-10-CM

## 2023-10-02 MED ORDER — DILTIAZEM HCL ER 120 MG PO CP24: 120.0000 mg | ORAL_CAPSULE | Freq: Every day | ORAL | 3 refills | Status: DC

## 2023-10-02 NOTE — Patient Instructions (Addendum)
 Medication Instructions:  Decrease Diltiazem  to 120 mg once daily *If you need a refill on your cardiac medications before your next appointment, please call your pharmacy*  Lab Work: NONE If you have labs (blood work) drawn today and your tests are completely normal, you will receive your results only by: MyChart Message (if you have MyChart) OR A paper copy in the mail If you have any lab test that is abnormal or we need to change your treatment, we will call you to review the results.  Testing/Procedures: NONE  Follow-Up: At [facility], you and your health needs are our priority.  As part of our continuing mission to provide you with exceptional heart care, our providers are all part of one team.  This team includes your primary Cardiologist (physician) and Advanced Practice Providers or APPs (Physician Assistants and Nurse Practitioners) who all work together to provide you with the care you need, when you need it.  Your next appointment:   3 month(s)  Provider:   One of our Advanced Practice Providers (APPs): Morse Clause, PA-C  Lamarr Satterfield, NP Miriam Shams, NP  Olivia Pavy, PA-C Josefa Beauvais, NP  Leontine Salen, PA-C Orren Fabry, PA-C  Hao Meng, PA-C Ernest Dick, NP  Damien Braver, NP Jon Hails, PA-C  Waddell Donath, PA-C    Dayna Dunn, PA-C  Scott Weaver, PA-C Lum Louis, NP Katlyn West, NP Callie Goodrich, PA-C  Xika Zhao, NP Sheng Haley, PA-C    Kathleen Johnson, PA-C    We recommend signing up for the patient portal called MyChart.  Sign up information is provided on this After Visit Summary.  MyChart is used to connect with patients for Virtual Visits (Telemedicine).  Patients are able to view lab/test results, encounter notes, upcoming appointments, etc.  Non-urgent messages can be sent to your provider as well.   To learn more about what you can do with MyChart, go to [email or web address].

## 2023-10-17 ENCOUNTER — Encounter: Payer: Self-pay | Admitting: Cardiology

## 2023-10-18 ENCOUNTER — Other Ambulatory Visit: Payer: Self-pay

## 2023-10-18 MED ORDER — DILTIAZEM HCL ER 180 MG PO CP24: 180.0000 mg | ORAL_CAPSULE | Freq: Every day | ORAL | 3 refills | Status: AC

## 2023-10-26 ENCOUNTER — Other Ambulatory Visit: Payer: Self-pay | Admitting: Cardiology

## 2023-10-26 DIAGNOSIS — E785 Hyperlipidemia, unspecified: Secondary | ICD-10-CM

## 2023-11-28 ENCOUNTER — Other Ambulatory Visit: Payer: Self-pay | Admitting: Cardiology

## 2023-12-05 ENCOUNTER — Ambulatory Visit: Admitting: Physician Assistant

## 2023-12-25 ENCOUNTER — Ambulatory Visit: Admitting: Family Medicine

## 2023-12-25 ENCOUNTER — Encounter: Payer: Self-pay | Admitting: Family Medicine

## 2023-12-25 VITALS — BP 124/80 | HR 50 | Temp 97.9°F | Ht 60.0 in | Wt 169.8 lb

## 2023-12-25 DIAGNOSIS — R739 Hyperglycemia, unspecified: Secondary | ICD-10-CM | POA: Diagnosis not present

## 2023-12-25 DIAGNOSIS — Z23 Encounter for immunization: Secondary | ICD-10-CM | POA: Diagnosis not present

## 2023-12-25 DIAGNOSIS — Z0000 Encounter for general adult medical examination without abnormal findings: Secondary | ICD-10-CM | POA: Diagnosis not present

## 2023-12-25 LAB — COMPREHENSIVE METABOLIC PANEL WITH GFR
AG Ratio: 1.6 (calc) (ref 1.0–2.5)
ALT: 29 U/L (ref 6–29)
AST: 39 U/L — ABNORMAL HIGH (ref 10–35)
Albumin: 4.5 g/dL (ref 3.6–5.1)
Alkaline phosphatase (APISO): 73 U/L (ref 37–153)
BUN: 14 mg/dL (ref 7–25)
CO2: 28 mmol/L (ref 20–32)
Calcium: 9.4 mg/dL (ref 8.6–10.4)
Chloride: 102 mmol/L (ref 98–110)
Creat: 0.98 mg/dL (ref 0.50–1.05)
Globulin: 2.9 g/dL (ref 1.9–3.7)
Glucose, Bld: 134 mg/dL — ABNORMAL HIGH (ref 65–99)
Potassium: 4.1 mmol/L (ref 3.5–5.3)
Sodium: 140 mmol/L (ref 135–146)
Total Bilirubin: 0.9 mg/dL (ref 0.2–1.2)
Total Protein: 7.4 g/dL (ref 6.1–8.1)
eGFR: 66 mL/min/1.73m2

## 2023-12-25 LAB — CBC WITH DIFFERENTIAL/PLATELET
Absolute Lymphocytes: 2760 {cells}/uL (ref 850–3900)
Absolute Monocytes: 451 {cells}/uL (ref 200–950)
Basophils Absolute: 28 {cells}/uL (ref 0–200)
Basophils Relative: 0.3 %
Eosinophils Absolute: 92 {cells}/uL (ref 15–500)
Eosinophils Relative: 1 %
HCT: 42.3 % (ref 35.9–46.0)
Hemoglobin: 13.9 g/dL (ref 11.7–15.5)
MCH: 31.6 pg (ref 27.0–33.0)
MCHC: 32.9 g/dL (ref 31.6–35.4)
MCV: 96.1 fL (ref 81.4–101.7)
MPV: 10.2 fL (ref 7.5–12.5)
Monocytes Relative: 4.9 %
Neutro Abs: 5870 {cells}/uL (ref 1500–7800)
Neutrophils Relative %: 63.8 %
Platelets: 182 Thousand/uL (ref 140–400)
RBC: 4.4 Million/uL (ref 3.80–5.10)
RDW: 12 % (ref 11.0–15.0)
Total Lymphocyte: 30 %
WBC: 9.2 Thousand/uL (ref 3.8–10.8)

## 2023-12-25 LAB — LIPID PANEL
Cholesterol: 105 mg/dL
HDL: 54 mg/dL
LDL Cholesterol (Calc): 32 mg/dL
Non-HDL Cholesterol (Calc): 51 mg/dL
Total CHOL/HDL Ratio: 1.9 (calc)
Triglycerides: 104 mg/dL

## 2023-12-25 LAB — HEMOGLOBIN A1C
Hgb A1c MFr Bld: 6.2 % — ABNORMAL HIGH
Mean Plasma Glucose: 131 mg/dL
eAG (mmol/L): 7.3 mmol/L

## 2023-12-25 NOTE — Progress Notes (Signed)
 "  Established Patient Office Visit  Subjective   Patient ID: Sarah Wong, female    DOB: 04-Jan-1965  Age: 58 y.o. MRN: [id number]  Chief Complaint  Patient presents with   Annual Exam    HPI   [facility] is seen for complete physical.  She has history of kidney stones, dyslipidemia, soft plaque LAD, history of vitamin D  deficiency.  She is followed by cardiology and also sees urologist regularly.  Generally doing well.  No recent kidney stones.  Denies any recent chest pains.  She had prediabetes range blood sugars last year with A1c 5.9%.  Health maintenance reviewed  Health Maintenance  Topic Date Due   Pneumococcal Vaccine: 50+ Years (1 of 2 - PCV) Never done   Influenza Vaccine  08/02/2023   COVID-19 Vaccine (2 - 2025-26 season) 09/02/2023   HIV Screening  01/04/2024 (Originally 05/21/1980)   Cervical Cancer Screening (HPV/Pap Cotest)  03/24/2024 (Originally 01/01/2010)   Mammogram  06/02/2025   DTaP/Tdap/Td (2 - Td or Tdap) 06/13/2027   Colonoscopy  10/26/2028   Hepatitis C Screening  Completed   Zoster Vaccines- Shingrix  Completed   Hepatitis B Vaccines 19-59 Average Risk  Aged Out   HPV VACCINES  Aged Out   Meningococcal B Vaccine  Aged Out   - No flu vaccine yet and she would like to get that today.  Also no history of Prevnar 20 and she consents to getting that today. -Colonoscopy due 2030 -Mammograms up-to-date -Has had prior Shingrix  Social history-she is married.  She has 2 daughters and 5 grandchildren.  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  No history of smoking.  No alcohol.  Family history-both parents deceased.  Father had lung cancer, hypertension, hyperlipidemia.  Mother had COPD, gout, type 2 diabetes, hypertension, CAD.  Her father had bypass in his 72s.  Brother with CAD at age 79.  Past Medical History:  Diagnosis Date   Edema, lower extremity    Fundic gland polyps of stomach, benign    GERD (gastroesophageal reflux  disease)    History of kidney stones    HTN (hypertension)    Hyperlipidemia    history of, no medications now   Mild obstructive sleep apnea    PER STUDY 07-07-2010--  MILD OSA/  NO CPAP RX     MVP (mitral valve prolapse)    Palpitations    Renal calculus, bilateral    NON-OBSTRUTIVE   Right ureteral stone    Sleep apnea    Splenic flexure syndrome    Past Surgical History:  Procedure Laterality Date   ANKLE SURGERY Left    CARDIOVASCULAR STRESS TEST  11-08-2009  [facility]   NORMAL PERFUSION STUDY/ NO ISCHEMIA/ EF 63%   CYSTOSCOPY W/ URETERAL STENT PLACEMENT  05/21/2011   Procedure: CYSTOSCOPY WITH RETROGRADE PYELOGRAM/URETERAL STENT PLACEMENT;  Surgeon: Norleen JINNY Seltzer, MD;  Location: [facility];  Service: Urology;  Laterality: Left;   CYSTOSCOPY WITH STENT PLACEMENT Right 11/20/2012   Procedure: CYSTOSCOPY WITH STENT PLACEMENT;  Surgeon: Norleen Seltzer, MD;  Location: [facility];  Service: Urology;  Laterality: Right;   CYSTOSCOPY WITH URETEROSCOPY, STONE BASKETRY AND STENT PLACEMENT Right 11/20/2012   Procedure: RIGHT URETEROSCOPY,BALLOON DILITATION, STONE EXTRACTION  ;  Surgeon: Norleen Seltzer, MD;  Location: [facility];  Service: Urology;  Laterality: Right;   EXTRACORPOREAL SHOCK WAVE LITHOTRIPSY  LEFT 05-31-2011/   RIGHT 10-30-2012   EXTRACORPOREAL SHOCK WAVE LITHOTRIPSY Right 03/14/2017  Procedure: RIGHT EXTRACORPOREAL SHOCK WAVE LITHOTRIPSY (ESWL);  Surgeon: Gaston Hamilton, MD;  Location: [facility];  Service: Urology;  Laterality: Right;   HAND SURGERY Right 11/2016   trigger finger   HOLMIUM LASER APPLICATION Right 11/20/2012   Procedure: HOLMIUM LASER APPLICATION;  Surgeon: Norleen Seltzer, MD;  Location: [facility];  Service: Urology;  Laterality: Right;   NASAL SEPTUM SURGERY  DEC 2013   RIGHT WRIST EXPLORATION/ CAPSULOTOMY/ TENOLYSIS  03-30-2002   TENDON REPAIR Left 09/06/2016   Procedure: Tenolysis of left peroneal tendons with repair of  peroneus brevus;  Surgeon: Kit Norleen, MD;  Location: [facility];  Service: Orthopedics;  Laterality: Left;   TRANSTHORACIC ECHOCARDIOGRAM  03-27-2012   NORMAL LVF/  EF 55-60%/  NO EVIDENCE MVP   TUBAL LIGATION  1994   VAGINAL HYSTERECTOMY  1999   WRIST GANGLION EXCISION Right 11-14-2001    reports that she has never smoked. She has been exposed to tobacco smoke. She has never used smokeless tobacco. She reports that she does not drink alcohol and does not use drugs. family history includes COPD in her mother; Cancer (age of onset: 20) in her father; Diabetes in her mother; Gout in her brother; Heart disease in her father; Heart disease (age of onset: 61) in her mother; Heart failure in her father; Hyperlipidemia in her father and mother; Hypertension in her father and mother; Obesity in her mother; Sleep apnea in her mother; Stroke in her maternal grandmother; Sudden death in her mother; Thyroid  disease in her mother. Allergies[1]  Review of Systems  Constitutional:  Negative for chills, fever, malaise/fatigue and weight loss.  HENT:  Negative for hearing loss.   Eyes:  Negative for blurred vision and double vision.  Respiratory:  Negative for cough and shortness of breath.   Cardiovascular:  Negative for chest pain, palpitations and leg swelling.  Gastrointestinal:  Negative for abdominal pain, blood in stool, constipation and diarrhea.  Genitourinary:  Negative for dysuria.  Skin:  Negative for rash.  Neurological:  Negative for dizziness, speech change, seizures, loss of consciousness and headaches.  Psychiatric/Behavioral:  Negative for depression.       Objective:     BP 124/80 (BP Location: Left Arm, Patient Position: Sitting, Cuff Size: Normal)   Pulse (!) 50   Temp 97.9 F (36.6 C) (Oral)   Ht 5' (1.524 m)   Wt 169 lb 12.8 oz (77 kg)   SpO2 96%   BMI 33.16 kg/m  BP Readings from Last 3 Encounters:  12/25/23 124/80  10/02/23 130/82  07/08/23 124/72    Wt Readings from Last 3 Encounters:  12/25/23 169 lb 12.8 oz (77 kg)  10/02/23 169 lb (76.7 kg)  07/08/23 168 lb 8 oz (76.4 kg)      Physical Exam Vitals reviewed.  Constitutional:      General: She is not in acute distress.    Appearance: She is well-developed. She is not ill-appearing.  HENT:     Head: Normocephalic and atraumatic.     Nose:     Comments: She has a little dried crusted secretions right naris. Eyes:     Pupils: Pupils are equal, round, and reactive to light.  Neck:     Thyroid : No thyromegaly.  Cardiovascular:     Rate and Rhythm: Normal rate and regular rhythm.     Heart sounds: Normal heart sounds. No murmur heard. Pulmonary:     Effort: No respiratory distress.     Breath sounds:  Normal breath sounds. No wheezing or rales.  Abdominal:     General: Bowel sounds are normal. There is no distension.     Palpations: Abdomen is soft. There is no mass.     Tenderness: There is no abdominal tenderness. There is no guarding or rebound.  Musculoskeletal:        General: Normal range of motion.     Cervical back: Normal range of motion and neck supple.     Right lower leg: No edema.     Left lower leg: No edema.  Lymphadenopathy:     Cervical: No cervical adenopathy.  Skin:    Findings: No rash.  Neurological:     Mental Status: She is alert and oriented to person, place, and time.     Cranial Nerves: No cranial nerve deficit.  Psychiatric:        Behavior: Behavior normal.        Thought Content: Thought content normal.        Judgment: Judgment normal.      No results found for any visits on 12/25/23.    The ASCVD Risk score (Arnett DK, et al., 2019) failed to calculate for the following reasons:   The valid total cholesterol range is 130 to 320 mg/dL    Assessment & Plan:   Problem List Items Addressed This Visit   None Visit Diagnoses       Hyperglycemia    -  Primary   Relevant Orders   Hemoglobin A1c     Physical exam        Relevant Orders   CBC with Differential/Platelet   Comprehensive metabolic panel with GFR   Lipid panel   Hemoglobin A65c     58 year old female with chronic medical problems as above who is seen today for physical exam.  -Recheck labs as above including A1c with prior history of prediabetes range blood sugars - Recommend influenza vaccine and Prevnar 20 and patient consents - Try to establish more consistent exercise.  She hopes to start more consistent walking in January - Colonoscopy up-to-date - Consider annual dermatology skin check with her skin type  No follow-ups on file.    Wolm Scarlet, MD     [1]  Allergies Allergen Reactions   Penicillins Hives, Rash and Other (See Comments)   Vancomycin Swelling    Other reaction(s): Redness   Iohexol  Hives     Code: HIVES, Desc: HIVES WITH IV CONTRAST MEDIA- PT REQUIRES 13 HR PRE-MEDS, Onset Date: 11202008    Ivp Dye [Iodinated Contrast Media] Hives   "

## 2023-12-29 ENCOUNTER — Ambulatory Visit: Payer: Self-pay | Admitting: Family Medicine

## 2024-01-06 ENCOUNTER — Ambulatory Visit (INDEPENDENT_AMBULATORY_CARE_PROVIDER_SITE_OTHER): Admitting: Family Medicine

## 2024-01-06 ENCOUNTER — Encounter: Payer: Self-pay | Admitting: Family Medicine

## 2024-01-06 VITALS — BP 120/60 | HR 79 | Temp 97.7°F | Wt 167.8 lb

## 2024-01-06 DIAGNOSIS — J069 Acute upper respiratory infection, unspecified: Secondary | ICD-10-CM

## 2024-01-06 MED ORDER — HYDROCODONE BIT-HOMATROP MBR 5-1.5 MG/5ML PO SOLN: 5.0000 mL | Freq: Four times a day (QID) | ORAL | 0 refills | Status: DC | PRN

## 2024-01-06 NOTE — Progress Notes (Signed)
 "  Established Patient Office Visit  Subjective   Patient ID: Sarah Wong, female    DOB: 09/07/1965  Age: 59 y.o. MRN: [id number]  Chief Complaint  Patient presents with   Fatigue        Nasal Congestion   Cough   Epistaxis    HPI   Sarah Wong is seen with upper respiratory illness which started last Thursday.  She has had some sore throat, nasal congestion, cough.  Has had a couple of intermittent nosebleeds as well.  Cough mostly dry.  She has had increased fatigue.  Still work from home Friday.  Husband now has similar symptoms.  She does have some low-grade fever on Thursday and Friday but none since then.  Denies any nausea, vomiting, or diarrhea.  No dyspnea.  Has been using DayQuil and NyQuil.  Still having fairly severe cough at night which is interfering with sleep.  She is a non-smoker and has no chronic lung disease.  Past Medical History:  Diagnosis Date   Edema, lower extremity    Fundic gland polyps of stomach, benign    GERD (gastroesophageal reflux disease)    History of kidney stones    HTN (hypertension)    Hyperlipidemia    history of, no medications now   Mild obstructive sleep apnea    PER STUDY 07-07-2010--  MILD OSA/  NO CPAP RX     MVP (mitral valve prolapse)    Palpitations    Renal calculus, bilateral    NON-OBSTRUTIVE   Right ureteral stone    Sleep apnea    Splenic flexure syndrome    Past Surgical History:  Procedure Laterality Date   ANKLE SURGERY Left    CARDIOVASCULAR STRESS TEST  11-08-2009  DR [facility]   NORMAL PERFUSION STUDY/ NO ISCHEMIA/ EF 63%   CYSTOSCOPY W/ URETERAL STENT PLACEMENT  05/21/2011   Procedure: CYSTOSCOPY WITH RETROGRADE PYELOGRAM/URETERAL STENT PLACEMENT;  Surgeon: Norleen JINNY Seltzer, MD;  Location[facility];  Service: Urology;  Laterality: Left;   CYSTOSCOPY WITH STENT PLACEMENT Right 11/20/2012   Procedure: CYSTOSCOPY WITH STENT PLACEMENT;  Surgeon: Norleen Seltzer, MD;  Location[facility];  Service: Urology;   Laterality: Right;   CYSTOSCOPY WITH URETEROSCOPY, STONE BASKETRY AND STENT PLACEMENT Right 11/20/2012   Procedure: RIGHT URETEROSCOPY,BALLOON DILITATION, STONE EXTRACTION  ;  Surgeon: Norleen Seltzer, MD;  Location: [facility];  Service: Urology;  Laterality: Right;   EXTRACORPOREAL SHOCK WAVE LITHOTRIPSY  LEFT 05-31-2011/   RIGHT 10-30-2012   EXTRACORPOREAL SHOCK WAVE LITHOTRIPSY Right 03/14/2017   Procedure: RIGHT EXTRACORPOREAL SHOCK WAVE LITHOTRIPSY (ESWL);  Surgeon: Gaston Hamilton, MD;  Location[facility];  Service: Urology;  Laterality: Right;   HAND SURGERY Right 11/2016   trigger finger   HOLMIUM LASER APPLICATION Right 11/20/2012   Procedure: HOLMIUM LASER APPLICATION;  Surgeon: Norleen Seltzer, MD;  Location: [facility];  Service: Urology;  Laterality: Right;   NASAL SEPTUM SURGERY  DEC 2013   RIGHT WRIST EXPLORATION/ CAPSULOTOMY/ TENOLYSIS  03-30-2002   TENDON REPAIR Left 09/06/2016   Procedure: Tenolysis of left peroneal tendons with repair of peroneus brevus;  Surgeon: Kit Norleen, MD;  Location: [facility];  Service: Orthopedics;  Laterality: Left;   TRANSTHORACIC ECHOCARDIOGRAM  03-27-2012   NORMAL LVF/  EF 55-60%/  NO EVIDENCE MVP   TUBAL LIGATION  1994   VAGINAL HYSTERECTOMY  1999   WRIST GANGLION EXCISION Right 11-14-2001    reports that she has never smoked.  She has been exposed to tobacco smoke. She has never used smokeless tobacco. She reports that she does not drink alcohol and does not use drugs. family history includes COPD in her mother; Cancer (age of onset: 4) in her father; Diabetes in her mother; Gout in her brother; Heart disease in her father; Heart disease (age of onset: 14) in her mother; Heart failure in her father; Hyperlipidemia in her father and mother; Hypertension in her father and mother; Obesity in her mother; Sleep apnea in her mother; Stroke in her maternal grandmother; Sudden death in her mother; Thyroid  disease in  her mother. Allergies[1]  Review of Systems  Constitutional:  Positive for malaise/fatigue. Negative for chills and fever.  HENT:  Positive for congestion and sore throat.   Respiratory:  Positive for cough.   Cardiovascular:  Negative for chest pain.      Objective:     BP 120/60   Pulse 79   Temp 97.7 F (36.5 C) (Oral)   Wt 167 lb 12.8 oz (76.1 kg)   SpO2 97%   BMI 32.77 kg/m  BP Readings from Last 3 Encounters:  01/06/24 120/60  12/25/23 124/80  10/02/23 130/82   Wt Readings from Last 3 Encounters:  01/06/24 167 lb 12.8 oz (76.1 kg)  12/25/23 169 lb 12.8 oz (77 kg)  10/02/23 169 lb (76.7 kg)      Physical Exam Vitals reviewed.  Constitutional:      General: She is not in acute distress.    Appearance: She is not ill-appearing.  HENT:     Ears:     Comments: She has a bit of scarring left TM but no acute changes.  Right TM is normal.    Nose:     Comments: She has a couple prominent vessels Kiesselbach plexus right naris but no blood clots and no active bleeding    Mouth/Throat:     Comments: Oropharynx reveals couple small aphthous type ulcers left anterior tonsillar pillar region.  No exudate. Cardiovascular:     Rate and Rhythm: Normal rate and regular rhythm.  Pulmonary:     Effort: Pulmonary effort is normal.     Breath sounds: Normal breath sounds. No wheezing or rales.  Musculoskeletal:     Cervical back: Neck supple.  Lymphadenopathy:     Cervical: No cervical adenopathy.  Neurological:     Mental Status: She is alert.      No results found for any visits on 01/06/24.    The ASCVD Risk score (Arnett DK, et al., 2019) failed to calculate for the following reasons:   The valid total cholesterol range is 130 to 320 mg/dL    Assessment & Plan:   Probable viral URI with cough.  Nonfocal lung exam.  Nighttime cough has been fairly severe not relieved with NyQuil.  We wrote for Hycodan cough syrup 1 teaspoon every 6 hours as needed for severe  cough.  Follow-up promptly for any fever, shortness of breath, or other concerns.  Plenty fluids and rest.  Wolm Scarlet, MD     [1]  Allergies Allergen Reactions   Penicillins Hives, Rash and Other (See Comments)   Vancomycin Swelling    Other reaction(s): Redness   Iohexol  Hives     Code: HIVES, Desc: HIVES WITH IV CONTRAST MEDIA- PT REQUIRES 13 HR PRE-MEDS, Onset Date: 11202008    Ivp Dye [Iodinated Contrast Media] Hives   "

## 2024-01-15 ENCOUNTER — Other Ambulatory Visit: Payer: Self-pay | Admitting: Cardiology

## 2024-01-16 ENCOUNTER — Other Ambulatory Visit: Payer: Self-pay

## 2024-01-16 ENCOUNTER — Encounter (HOSPITAL_COMMUNITY): Payer: Self-pay | Admitting: Urology

## 2024-01-16 ENCOUNTER — Other Ambulatory Visit: Payer: Self-pay | Admitting: Urology

## 2024-01-16 NOTE — Progress Notes (Addendum)
 For Anesthesia: PCP - Wolm LELON Scarlet, MD last office visit note 01/06/24 in [facility] Cardiologist - Lavona Agent, MD last office visit note 10/02/23 in [facility]  Bowel Prep reminder:N/A   Chest x-ray - 04/27/2016 in [facility] EKG - 10/02/23 Stress Test - 11/16/2015  in [facility] ECHO - 10/21/2019 in [facility] Cardiac Cath - N/A Pacemaker/ICD device last checked: N/A Pacemaker orders received: N/A Device Rep notified: N/A  Spinal Cord Stimulator: N/A  Sleep Study - 07/07/2010 in [facility] CPAP - N/A  Fasting Blood Sugar - N/A Checks Blood Sugar __N/A___ times a day Date and result of last Hgb A1c- 12/25/23 (6.2)  Last dose of GLP1 agonist- N/A GLP1 instructions: Hold 7 days prior to schedule (Hold 24 hours-daily)   Last dose of SGLT-2 inhibitors- N/A SGLT-2 instructions: Hold 72 hours prior to surgery  Blood Thinner Instructions: N/A Last Dose: N/A Time last taken:N/A  Aspirin Instructions: N/A Last Dose: N/A Time last taken:N/A  Activity level: Can go up a flight of stairs and activities of daily living without stopping and without chest pain and/or shortness of breath       Anesthesia review: N/A  Patient denies shortness of breath, fever, cough and chest pain at PAT appointment   Patient verbalized understanding of instructions that were reviewed over the telephone.

## 2024-01-17 ENCOUNTER — Ambulatory Visit (HOSPITAL_COMMUNITY)
Admission: RE | Admit: 2024-01-17 | Discharge: 2024-01-17 | Disposition: A | Discharge status: Home / Self Care | Attending: Urology | Admitting: Urology

## 2024-01-17 ENCOUNTER — Ambulatory Visit (HOSPITAL_COMMUNITY): Admitting: Anesthesiology

## 2024-01-17 ENCOUNTER — Encounter (HOSPITAL_COMMUNITY): Admission: RE | Disposition: A | Payer: Self-pay | Source: Home / Self Care | Attending: Urology

## 2024-01-17 ENCOUNTER — Ambulatory Visit (HOSPITAL_COMMUNITY)

## 2024-01-17 ENCOUNTER — Encounter (HOSPITAL_COMMUNITY): Payer: Self-pay | Admitting: Urology

## 2024-01-17 DIAGNOSIS — I2510 Atherosclerotic heart disease of native coronary artery without angina pectoris: Secondary | ICD-10-CM | POA: Diagnosis not present

## 2024-01-17 DIAGNOSIS — N202 Calculus of kidney with calculus of ureter: Secondary | ICD-10-CM

## 2024-01-17 DIAGNOSIS — K219 Gastro-esophageal reflux disease without esophagitis: Secondary | ICD-10-CM | POA: Diagnosis not present

## 2024-01-17 DIAGNOSIS — Z87440 Personal history of urinary (tract) infections: Secondary | ICD-10-CM | POA: Diagnosis not present

## 2024-01-17 DIAGNOSIS — G709 Myoneural disorder, unspecified: Secondary | ICD-10-CM | POA: Insufficient documentation

## 2024-01-17 DIAGNOSIS — Z01818 Encounter for other preprocedural examination: Secondary | ICD-10-CM

## 2024-01-17 DIAGNOSIS — G4730 Sleep apnea, unspecified: Secondary | ICD-10-CM | POA: Diagnosis not present

## 2024-01-17 DIAGNOSIS — N201 Calculus of ureter: Secondary | ICD-10-CM | POA: Diagnosis present

## 2024-01-17 DIAGNOSIS — I10 Essential (primary) hypertension: Secondary | ICD-10-CM | POA: Insufficient documentation

## 2024-01-17 DIAGNOSIS — N289 Disorder of kidney and ureter, unspecified: Secondary | ICD-10-CM | POA: Diagnosis not present

## 2024-01-17 HISTORY — DX: Bradycardia, unspecified: R00.1

## 2024-01-17 HISTORY — DX: Nausea with vomiting, unspecified: R11.2

## 2024-01-17 HISTORY — DX: Other specified postprocedural states: Z98.890

## 2024-01-17 HISTORY — PX: CYSTOSCOPY/URETEROSCOPY/HOLMIUM LASER/STENT PLACEMENT: SHX6546

## 2024-01-17 HISTORY — DX: Carpal tunnel syndrome, unspecified upper limb: G56.00

## 2024-01-17 LAB — BASIC METABOLIC PANEL WITH GFR
Anion gap: 11 (ref 5–15)
BUN: 24 mg/dL — ABNORMAL HIGH (ref 6–20)
CO2: 25 mmol/L (ref 22–32)
Calcium: 9.3 mg/dL (ref 8.9–10.3)
Chloride: 102 mmol/L (ref 98–111)
Creatinine, Ser: 2.03 mg/dL — ABNORMAL HIGH (ref 0.44–1.00)
GFR, Estimated: 27 mL/min — ABNORMAL LOW
Glucose, Bld: 112 mg/dL — ABNORMAL HIGH (ref 70–99)
Potassium: 4.3 mmol/L (ref 3.5–5.1)
Sodium: 139 mmol/L (ref 135–145)

## 2024-01-17 LAB — CBC
HCT: 35.2 % — ABNORMAL LOW (ref 36.0–46.0)
Hemoglobin: 11.6 g/dL — ABNORMAL LOW (ref 12.0–15.0)
MCH: 31.3 pg (ref 26.0–34.0)
MCHC: 33 g/dL (ref 30.0–36.0)
MCV: 94.9 fL (ref 80.0–100.0)
Platelets: 151 K/uL (ref 150–400)
RBC: 3.71 MIL/uL — ABNORMAL LOW (ref 3.87–5.11)
RDW: 12.2 % (ref 11.5–15.5)
WBC: 7.8 K/uL (ref 4.0–10.5)
nRBC: 0 % (ref 0.0–0.2)

## 2024-01-17 MED ORDER — LACTATED RINGERS IV SOLN: INTRAVENOUS | Status: DC

## 2024-01-17 MED ORDER — CEFAZOLIN SODIUM-DEXTROSE 2-4 GM/100ML-% IV SOLN
2.0000 g | INTRAVENOUS | Status: AC
  Administered 2024-01-17: 2 g via INTRAVENOUS
  Filled 2024-01-17: qty 100

## 2024-01-17 MED ORDER — FENTANYL CITRATE (PF) 250 MCG/5ML IJ SOLN
INTRAMUSCULAR | Status: DC | PRN
  Administered 2024-01-17: 50 ug via INTRAVENOUS
  Administered 2024-01-17 (×2): 25 ug via INTRAVENOUS

## 2024-01-17 MED ORDER — SODIUM CHLORIDE 0.9% FLUSH: 3.0000 mL | Freq: Two times a day (BID) | INTRAVENOUS | Status: DC

## 2024-01-17 MED ORDER — PROPOFOL 10 MG/ML IV BOLUS
INTRAVENOUS | Status: DC | PRN
  Administered 2024-01-17: 150 mg via INTRAVENOUS
  Administered 2024-01-17: 50 mg via INTRAVENOUS

## 2024-01-17 MED ORDER — DEXAMETHASONE SOD PHOSPHATE PF 10 MG/ML IJ SOLN
INTRAMUSCULAR | Status: DC | PRN
  Administered 2024-01-17: 4 mg via INTRAVENOUS

## 2024-01-17 MED ORDER — FENTANYL CITRATE (PF) 100 MCG/2ML IJ SOLN
INTRAMUSCULAR | Status: AC
  Filled 2024-01-17: qty 2

## 2024-01-17 MED ORDER — ORAL CARE MOUTH RINSE: 15.0000 mL | Freq: Once | OROMUCOSAL | Status: AC

## 2024-01-17 MED ORDER — DROPERIDOL 2.5 MG/ML IJ SOLN: 0.6250 mg | Freq: Once | INTRAMUSCULAR | Status: DC | PRN

## 2024-01-17 MED ORDER — CHLORHEXIDINE GLUCONATE 0.12 % MT SOLN
15.0000 mL | Freq: Once | OROMUCOSAL | Status: AC
  Administered 2024-01-17: 15 mL via OROMUCOSAL

## 2024-01-17 MED ORDER — MIDAZOLAM HCL (PF) 2 MG/2ML IJ SOLN
INTRAMUSCULAR | Status: DC | PRN
  Administered 2024-01-17 (×2): 1 mg via INTRAVENOUS

## 2024-01-17 MED ORDER — SODIUM CHLORIDE 0.9 % IR SOLN
Status: DC | PRN
  Administered 2024-01-17: 3000 mL

## 2024-01-17 MED ORDER — PROPOFOL 10 MG/ML IV BOLUS
INTRAVENOUS | Status: AC
  Filled 2024-01-17: qty 20

## 2024-01-17 MED ORDER — OXYCODONE HCL 5 MG PO TABS: 5.0000 mg | ORAL_TABLET | Freq: Once | ORAL | Status: DC | PRN

## 2024-01-17 MED ORDER — MIDAZOLAM HCL 2 MG/2ML IJ SOLN
INTRAMUSCULAR | Status: AC
  Filled 2024-01-17: qty 2

## 2024-01-17 MED ORDER — LIDOCAINE HCL (PF) 2 % IJ SOLN
INTRAMUSCULAR | Status: DC | PRN
  Administered 2024-01-17: 60 mg via INTRADERMAL

## 2024-01-17 MED ORDER — OXYCODONE HCL 5 MG/5ML PO SOLN: 5.0000 mg | Freq: Once | ORAL | Status: DC | PRN

## 2024-01-17 MED ORDER — ONDANSETRON HCL 4 MG/2ML IJ SOLN
INTRAMUSCULAR | Status: DC | PRN
  Administered 2024-01-17: 4 mg via INTRAVENOUS

## 2024-01-17 MED ORDER — GLYCOPYRROLATE 0.2 MG/ML IJ SOLN
INTRAMUSCULAR | Status: DC | PRN
  Administered 2024-01-17: .1 mg via INTRAVENOUS

## 2024-01-17 MED ORDER — FENTANYL CITRATE (PF) 50 MCG/ML IJ SOSY: 25.0000 ug | PREFILLED_SYRINGE | INTRAMUSCULAR | Status: DC | PRN

## 2024-01-17 MED ORDER — ACETAMINOPHEN 500 MG PO TABS
1000.0000 mg | ORAL_TABLET | Freq: Once | ORAL | Status: AC
  Administered 2024-01-17: 1000 mg via ORAL
  Filled 2024-01-17: qty 2

## 2024-01-17 NOTE — Discharge Instructions (Addendum)
 We will remove the stent in the office when you return.

## 2024-01-17 NOTE — Op Note (Signed)
 Procedure: 1.  Cystoscopy with left ureteroscopy with holmium laser application, stone extraction and insertion of left double-J stent. 2.  Application of fluoroscopy.  Pre-op diagnosis: Left proximal ureteral stone and small renal stone.  Postop diagnosis: Same.  Surgeon: Dr. Norleen Seltzer.  Anesthesia: General.  Specimen: Stone fragments.  Drains: 6 French by 22 cm left Contour double-J stent without tether.  EBL: None.  Complications: None.  Indications: The patient is a 59 year old female with a history recurrent urolithiasis who was recently seen in the office for a left proximal ureteral stone with a UTI.  She had no symptoms of sepsis or fever and was started on Cipro  and returns now for management of her stone.  Procedure: She was taken the operating room where general anesthetic was induced.  She was given 2 g of Ancef .  She was placed in lithotomy position and fitted with PAS hose.  Her perineum and genitalia were prepped with Betadine solution she was draped in usual sterile fashion.  Cystoscopy was performed with a 21 French scope and 30 degree lens.  Examination: Normal urethra.  The bladder wall had mild trabeculation without mucosal lesions.  Ureteral orifices were unremarkable.  A sensor wire was advanced to the kidney under fluoroscopic guidance and easily went by the stone.  There was some bloody turbid urine in the drained by the the wire.  The cystoscope was removed and the inner core of a 11/13 French 36 cm digital access sheath was passed over the wire to just below the stone.  It was tight but passed without excessive resistance.  I then passed the assembled sheath and it went easier.  The inner core and wire were removed and the dual-lumen digital flexible scope was passed and it was noted that the stone had actually retropulsed into the kidney.  The inner core and wire were placed and the access sheath was advanced more proximally.  The inner core and wire were  removed and the dual digital ureteroscope was then advanced to the kidney.  There was some inflamed mucosa with a bit of bleeding but the stone was visualized.  Initially I found a small stone in the midpole calyx which was removed intact with the engage basket.  The larger stone approximately 8 mm in size was an adjacent calyx and was fragmented using the 242 m holmium laser fiber with the Moses laser on the dusting setting.  A combination of energies was used and the stone was broken up and removed with the engage basket.  Once all significant fragments had been removed the ureteroscope and sheath were removed after the wire then replaced to the kidney.  The cystoscope was reinserted over the wire and a 6 French by 22 cm left Contour double-J stent was advanced the kidney under fluoroscopic guidance.  The wire removed, a good coil in the kidney and a good coil in the bladder.  The bladder was drained and the cystoscope was removed.  She was taken down from lithotomy position, her anesthetic was reversed and she was moved to recovery in stable condition.  There were no complications and the stone fragment was given to the family.

## 2024-01-17 NOTE — Interval H&P Note (Signed)
 History and Physical Interval Note:  No changes.  Still some pain.   01/17/2024 4:21 PM  Sarah Wong  has presented today for surgery, with the diagnosis of LEFT URETERAL STONE.  The various methods of treatment have been discussed with the patient and family. After consideration of risks, benefits and other options for treatment, the patient has consented to  Procedures: CYSTOSCOPY/URETEROSCOPY/HOLMIUM LASER/STENT PLACEMENT (Left) CYSTOSCOPY, WITH RETROGRADE PYELOGRAM (Left) as a surgical intervention.  The patient's history has been reviewed, patient examined, no change in status, stable for surgery.  I have reviewed the patient's chart and labs.  Questions were answered to the patient's satisfaction.     [place]

## 2024-01-17 NOTE — Transfer of Care (Signed)
 Immediate Anesthesia Transfer of Care Note  Patient: Sarah Wong  Procedure(s) Performed: CYSTOSCOPY/URETEROSCOPY/HOLMIUM LASER/STENT PLACEMENT (Left: Bladder)  Patient Location: PACU  Anesthesia Type:General  Level of Consciousness: awake, alert , oriented, and patient cooperative  Airway & Oxygen Therapy: Patient Spontanous Breathing and Patient connected to face mask oxygen  Post-op Assessment: Report given to RN and Post -op Vital signs reviewed and stable  Post vital signs: Reviewed and stable  Last Vitals:  Vitals Value Taken Time  BP 115/75 01/17/24 18:00  Temp    Pulse 78 01/17/24 18:03  Resp 14 01/17/24 18:03  SpO2 100 % 01/17/24 18:03  Vitals shown include unfiled device data.  Last Pain:  Vitals:   01/17/24 1431  TempSrc:   PainSc: 5       Patients Stated Pain Goal: 3 (01/16/24 1301)  Complications: There were no known notable events for this encounter.

## 2024-01-17 NOTE — Anesthesia Postprocedure Evaluation (Signed)
"   Anesthesia Post Note  Patient: Sarah Wong  Procedure(s) Performed: CYSTOSCOPY/URETEROSCOPY/HOLMIUM LASER/STENT PLACEMENT (Left: Bladder)     Patient location during evaluation: PACU Anesthesia Type: General Level of consciousness: awake and alert Pain management: pain level controlled Vital Signs Assessment: post-procedure vital signs reviewed and stable Respiratory status: spontaneous breathing, nonlabored ventilation, respiratory function stable and patient connected to nasal cannula oxygen Cardiovascular status: blood pressure returned to baseline and stable Postop Assessment: no apparent nausea or vomiting Anesthetic complications: no   There were no known notable events for this encounter.  Last Vitals:  Vitals:   01/17/24 1815 01/17/24 1830  BP: 122/69 134/71  Pulse: 65 61  Resp: 13 12  Temp:  36.8 C  SpO2: 97% 96%    Last Pain:  Vitals:   01/17/24 1830  TempSrc:   PainSc: 0-No pain                 Epifanio Lamar BRAVO      "

## 2024-01-17 NOTE — Anesthesia Preprocedure Evaluation (Signed)
"                                    Anesthesia Evaluation  Patient identified by MRN, date of birth, ID band Patient awake    Reviewed: Allergy & Precautions, NPO status , Patient's Chart, lab work & pertinent test results  History of Anesthesia Complications (+) PONV and history of anesthetic complications  Airway Mallampati: II  TM Distance: >3 FB Neck ROM: Full    Dental  (+) Dental Advisory Given   Pulmonary sleep apnea    breath sounds clear to auscultation       Cardiovascular hypertension, Pt. on medications + CAD   Rhythm:Regular Rate:Normal     Neuro/Psych  Neuromuscular disease    GI/Hepatic Neg liver ROS,GERD  ,,  Endo/Other  negative endocrine ROS    Renal/GU Renal disease     Musculoskeletal   Abdominal   Peds  Hematology negative hematology ROS (+)   Anesthesia Other Findings   Reproductive/Obstetrics                              Anesthesia Physical Anesthesia Plan  ASA: 2  Anesthesia Plan: General   Post-op Pain Management: Tylenol  PO (pre-op)*   Induction: Intravenous  PONV Risk Score and Plan: 4 or greater and Dexamethasone , Ondansetron , Midazolam  and Treatment may vary due to age or medical condition  Airway Management Planned: LMA  Additional Equipment:   Intra-op Plan:   Post-operative Plan: Extubation in OR  Informed Consent: I have reviewed the patients History and Physical, chart, labs and discussed the procedure including the risks, benefits and alternatives for the proposed anesthesia with the patient or authorized representative who has indicated his/her understanding and acceptance.     Dental advisory given  Plan Discussed with: CRNA  Anesthesia Plan Comments:         Anesthesia Quick Evaluation  "

## 2024-01-17 NOTE — Anesthesia Procedure Notes (Signed)
 Procedure Name: LMA Insertion Date/Time: 01/17/2024 4:49 PM  Performed by: Erick Fitz, CRNAPre-anesthesia Checklist: Patient identified, Emergency Drugs available, Suction available, Patient being monitored and Timeout performed Patient Re-evaluated:Patient Re-evaluated prior to induction Oxygen Delivery Method: Circle system utilized Preoxygenation: Pre-oxygenation with 100% oxygen Induction Type: IV induction Ventilation: Mask ventilation without difficulty LMA: LMA inserted LMA Size: 4.0 Number of attempts: 1 Placement Confirmation: positive ETCO2, CO2 detector and breath sounds checked- equal and bilateral Tube secured with: Tape (secured with 1/2 white silk tape) Dental Injury: Teeth and Oropharynx as per pre-operative assessment

## 2024-01-18 ENCOUNTER — Encounter (HOSPITAL_COMMUNITY): Payer: Self-pay | Admitting: Urology

## 2024-01-20 ENCOUNTER — Ambulatory Visit: Admitting: Cardiology

## 2024-01-29 DIAGNOSIS — M7989 Other specified soft tissue disorders: Secondary | ICD-10-CM | POA: Insufficient documentation

## 2024-01-29 NOTE — Progress Notes (Unsigned)
 " Cardiology Office Note:   Date:  01/29/2024  ID:  Sarah Wong, DOB Mar 02, 1965, MRN [id number] PCP: Micheal Wolm ORN, MD  [facility] Providers Cardiologist:  Lynwood Schilling, MD {  History of Present Illness:   Sarah Wong is a 59 y.o. female  presents for evaluation having previously been seen by Dr. Maye.  He followed her for many years for her mitral valve prolapse. Echo from 2014 demonstrated no evidence of prolapse and no regurgitation. He has also followed her because of a family history of early onset heart disease.   A coronary calcium  score from 2018 demonstrated no evidence of calcium .   She had a history of MVP but echo 2021 did not show this and it was essentially normal.  I saw her in August 2024 she had chest pain and had a CT with mild non calcified plaque of 20 - 49% stenosis in the LAD.  She was started on Crestor ..   Since I last saw her ***   ***   she has been working 50 hours per week which is down from 60.  She has been feeling very fatigued.  She has not had any syncope.  She occasionally feels her heart race.  She says she just feels a full body tiredness.  She does have some chest heaviness when she lies flat.  She is going to see gastroenterology.  She has not had any new exertional substernal chest discomfort since her CT in 2024.  She has had no new PND or orthopnea.  She has had no weight gain or edema.      ROS: ***  Studies Reviewed:    EKG:       ***  Risk Assessment/Calculations:   {Does this patient have ATRIAL FIBRILLATION?:[id number]} No BP recorded.  {Refresh Note OR Click here to enter BP  :1}***        Physical Exam:   VS:  There were no vitals taken for this visit.   Wt Readings from Last 3 Encounters:  01/16/24 167 lb (75.8 kg)  01/06/24 167 lb 12.8 oz (76.1 kg)  12/25/23 169 lb 12.8 oz (77 kg)     GEN: Well nourished, well developed in no acute distress NECK: No JVD; No carotid bruits CARDIAC: ***RR, ***  murmurs, rubs, gallops RESPIRATORY:  Clear to auscultation without rales, wheezing or rhonchi  ABDOMEN: Soft, non-tender, non-distended EXTREMITIES:  No edema; No deformity   ASSESSMENT AND PLAN:   BRADYCARDIA:   ***   I am going to reduce her Cardizem  to 120 mg daily.  She previously been on this for palpitations.  We might need to get rid of it completely if her heart rate still runs low like it is today or if she is still fatigued.  She also has a little lower extremity swelling.  She will let me know if I need to discontinue this in which case I might need to give her a as needed metoprolol .   CHEST PAIN:  ***   This sounds nonanginal.  She is going to follow-up with GI.   DYSLIPIDEMIA:     LDL was ***  49.  No change in therapy.]   EDEMA:    ***  This is mild and might be related to calcium  channel blocker.  No change in therapy other than as above.   FATIGUE:   ***  This could be related to her low heart rate but could also be her excessive work schedule.  She is up-to-date with labs and she is not anemic and has normal thyroid .  Plans as above.        Follow up ***  Signed, Lynwood Schilling, MD   "

## 2024-01-31 ENCOUNTER — Ambulatory Visit: Admitting: Cardiology

## 2024-01-31 DIAGNOSIS — R001 Bradycardia, unspecified: Secondary | ICD-10-CM

## 2024-01-31 DIAGNOSIS — M7989 Other specified soft tissue disorders: Secondary | ICD-10-CM

## 2024-01-31 DIAGNOSIS — E785 Hyperlipidemia, unspecified: Secondary | ICD-10-CM

## 2024-02-03 NOTE — Progress Notes (Unsigned)
 " Cardiology Office Note:   Date:  02/03/2024  ID:  Sarah Wong, DOB Jul 29, 1965, MRN [id number] PCP: Micheal Wolm ORN, MD  [facility] Providers Cardiologist:  Lynwood Schilling, MD {  History of Present Illness:   Sarah Wong is a 59 y.o. female  presents for evaluation having previously been seen by Dr. Maye.  He followed her for many years for her mitral valve prolapse. Echo from 2014 demonstrated no evidence of prolapse and no regurgitation. He has also followed her because of a family history of early onset heart disease.   A coronary calcium  score from 2018 demonstrated no evidence of calcium .   She had a history of MVP but echo 2021 did not show this and it was essentially normal.  I saw her in August 2024 she had chest pain and had a CT with mild non calcified plaque of 20 - 49% stenosis in the LAD.  She was started on Crestor ..   Since I last saw her ***   ***   she has been working 50 hours per week which is down from 60.  She has been feeling very fatigued.  She has not had any syncope.  She occasionally feels her heart race.  She says she just feels a full body tiredness.  She does have some chest heaviness when she lies flat.  She is going to see gastroenterology.  She has not had any new exertional substernal chest discomfort since her CT in 2024.  She has had no new PND or orthopnea.  She has had no weight gain or edema.      ROS: ***  Studies Reviewed:    EKG:       ***  Risk Assessment/Calculations:   {Does this patient have ATRIAL FIBRILLATION?:[id number]} No BP recorded.  {Refresh Note OR Click here to enter BP  :1}***        Physical Exam:   VS:  There were no vitals taken for this visit.   Wt Readings from Last 3 Encounters:  01/16/24 167 lb (75.8 kg)  01/06/24 167 lb 12.8 oz (76.1 kg)  12/25/23 169 lb 12.8 oz (77 kg)     GEN: Well nourished, well developed in no acute distress NECK: No JVD; No carotid bruits CARDIAC: ***RR, ***  murmurs, rubs, gallops RESPIRATORY:  Clear to auscultation without rales, wheezing or rhonchi  ABDOMEN: Soft, non-tender, non-distended EXTREMITIES:  No edema; No deformity   ASSESSMENT AND PLAN:   BRADYCARDIA:   ***   I am going to reduce her Cardizem  to 120 mg daily.  She previously been on this for palpitations.  We might need to get rid of it completely if her heart rate still runs low like it is today or if she is still fatigued.  She also has a little lower extremity swelling.  She will let me know if I need to discontinue this in which case I might need to give her a as needed metoprolol .   CHEST PAIN:  ***   This sounds nonanginal.  She is going to follow-up with GI.   DYSLIPIDEMIA:     LDL was ***  49.  No change in therapy.]   EDEMA:    ***  This is mild and might be related to calcium  channel blocker.  No change in therapy other than as above.   FATIGUE:   ***  This could be related to her low heart rate but could also be her excessive work schedule.  She is up-to-date with labs and she is not anemic and has normal thyroid .  Plans as above.        Follow up ***  Signed, Lynwood Schilling, MD   "

## 2024-02-05 ENCOUNTER — Ambulatory Visit: Admitting: Cardiology

## 2024-02-05 DIAGNOSIS — R001 Bradycardia, unspecified: Secondary | ICD-10-CM

## 2024-02-05 DIAGNOSIS — M7989 Other specified soft tissue disorders: Secondary | ICD-10-CM

## 2024-02-05 DIAGNOSIS — R072 Precordial pain: Secondary | ICD-10-CM

## 2024-02-05 DIAGNOSIS — E785 Hyperlipidemia, unspecified: Secondary | ICD-10-CM

## 2024-02-05 NOTE — Progress Notes (Unsigned)
 " Cardiology Office Note:   Date:  02/06/2024  ID:  Sarah Wong, DOB 07/12/1965, MRN [id number] PCP: Micheal Wolm ORN, MD  [facility] Providers Cardiologist:  Lynwood Schilling, MD {  History of Present Illness:   Sarah Wong is a 59 y.o. female  presents for evaluation having previously been seen by Dr. Maye.  He followed her for many years for her mitral valve prolapse. Echo from 2014 demonstrated no evidence of prolapse and no regurgitation. He has also followed her because of a family history of early onset heart disease.   A coronary calcium  score from 2018 demonstrated no evidence of calcium .   She had a history of MVP but echo 2021 did not show this and it was essentially normal.  I saw her in August 2024 she had chest pain and had a CT with mild non calcified plaque of 20 - 49% stenosis in the LAD.  She was started on Crestor ..   Since I last saw her she has run out of her Zetia .  She still working 50 hours a week.  She is not having any new chest pain though she gets occasional substernal dull to stabbing discomfort that comes and goes at rest.  It might last for 10 to 15 minutes.  It is not associated with nausea vomiting or diaphoresis.  It happens every 3 or 4 days.  She is not able to bring this on with physical activity.  It is unchanged from symptoms she had at the time of her coronary CT above.   ROS: As stated in the HPI and negative for all other systems.  Studies Reviewed:    EKG:   EKG Interpretation Date/Time:  Thursday February 06 2024 10:32:07 EST Ventricular Rate:  48 PR Interval:  150 QRS Duration:  76 QT Interval:  478 QTC Calculation: 427 R Axis:   12  Text Interpretation: Sinus bradycardia Low voltage QRS When compared with ECG of 02-Oct-2023 10:47, No significant change since last tracing Confirmed by Schilling Rattan ([id number]) on 02/06/2024 10:33:30 AM     Risk Assessment/Calculations:          Physical Exam:   VS:  BP 120/62 (BP  Location: Right Arm, Patient Position: Sitting, Cuff Size: Large)   Pulse 94   Resp 16   Ht 5' (1.524 m)   Wt 168 lb 8 oz (76.4 kg)   SpO2 96%   BMI 32.91 kg/m    Wt Readings from Last 3 Encounters:  02/06/24 168 lb 8 oz (76.4 kg)  01/16/24 167 lb (75.8 kg)  01/06/24 167 lb 12.8 oz (76.1 kg)     GEN: Well nourished, well developed in no acute distress NECK: No JVD; No carotid bruits CARDIAC: RRR, no murmurs, rubs, gallops RESPIRATORY:  Clear to auscultation without rales, wheezing or rhonchi  ABDOMEN: Soft, non-tender, non-distended EXTREMITIES:  No edema; No deformity   ASSESSMENT AND PLAN:   BRADYCARDIA:   She did have bradycardia and I reduced her Cardizem .  She is not having any symptomatic dysrhythmias and she can continue the meds as listed.  CHEST PAIN: She has a stable sporadic nonanginal sounding symptom that is unchanged since the time of her CT angiogram.  She will continue with primary risk reduction and should follow-up with her primary provider for management of her chest pain.  DYSLIPIDEMIA:     LDL was 32.  I will renew the Zetia  and the Crestor .      Follow up with  me in one year.   Signed, Lynwood Schilling, MD   "

## 2024-02-06 ENCOUNTER — Ambulatory Visit: Admitting: Cardiology

## 2024-02-06 ENCOUNTER — Encounter: Payer: Self-pay | Admitting: Cardiology

## 2024-02-06 VITALS — BP 120/62 | HR 94 | Resp 16 | Ht 60.0 in | Wt 168.5 lb

## 2024-02-06 DIAGNOSIS — R001 Bradycardia, unspecified: Secondary | ICD-10-CM | POA: Diagnosis not present

## 2024-02-06 DIAGNOSIS — M7989 Other specified soft tissue disorders: Secondary | ICD-10-CM | POA: Diagnosis not present

## 2024-02-06 DIAGNOSIS — E785 Hyperlipidemia, unspecified: Secondary | ICD-10-CM | POA: Diagnosis not present

## 2024-02-06 MED ORDER — ROSUVASTATIN CALCIUM 40 MG PO TABS: 40.0000 mg | ORAL_TABLET | Freq: Every day | ORAL | 3 refills | Status: AC

## 2024-02-06 MED ORDER — EZETIMIBE 10 MG PO TABS: 10.0000 mg | ORAL_TABLET | Freq: Every day | ORAL | 3 refills | Status: AC

## 2024-02-06 NOTE — Patient Instructions (Signed)
